# Patient Record
Sex: Male | Born: 1950
Health system: Southern US, Community
[De-identification: ages and names within clinical notes are randomized; demographics above are authoritative.]

## PROBLEM LIST (undated history)

## (undated) DIAGNOSIS — G6181 Chronic inflammatory demyelinating polyneuritis: Secondary | ICD-10-CM

## (undated) DIAGNOSIS — E785 Hyperlipidemia, unspecified: Secondary | ICD-10-CM

## (undated) DIAGNOSIS — G4730 Sleep apnea, unspecified: Secondary | ICD-10-CM

## (undated) DIAGNOSIS — E119 Type 2 diabetes mellitus without complications: Secondary | ICD-10-CM

## (undated) DIAGNOSIS — I10 Essential (primary) hypertension: Secondary | ICD-10-CM

## (undated) HISTORY — PX: KNEE SURGERY: SHX244

## (undated) HISTORY — PX: TONSILLECTOMY: SUR1361

## (undated) HISTORY — PX: APPENDECTOMY: SHX54

---

## 2005-05-30 ENCOUNTER — Ambulatory Visit: Payer: Self-pay

## 2007-09-10 ENCOUNTER — Ambulatory Visit: Payer: Self-pay | Admitting: Gastroenterology

## 2008-12-04 ENCOUNTER — Ambulatory Visit: Payer: Self-pay | Admitting: Family Medicine

## 2008-12-06 ENCOUNTER — Ambulatory Visit: Payer: Self-pay | Admitting: Family Medicine

## 2009-01-06 ENCOUNTER — Ambulatory Visit: Payer: Self-pay | Admitting: Family Medicine

## 2009-05-08 HISTORY — PX: COLONOSCOPY: SHX174

## 2010-07-03 ENCOUNTER — Emergency Department: Payer: Self-pay | Admitting: Emergency Medicine

## 2010-07-13 ENCOUNTER — Ambulatory Visit: Payer: Self-pay

## 2010-08-18 ENCOUNTER — Inpatient Hospital Stay: Payer: Self-pay | Admitting: Internal Medicine

## 2010-12-12 ENCOUNTER — Other Ambulatory Visit: Payer: Self-pay | Admitting: Neurology

## 2013-09-10 DIAGNOSIS — I10 Essential (primary) hypertension: Secondary | ICD-10-CM | POA: Insufficient documentation

## 2013-09-10 DIAGNOSIS — G6181 Chronic inflammatory demyelinating polyneuritis: Secondary | ICD-10-CM | POA: Insufficient documentation

## 2013-09-10 DIAGNOSIS — G4733 Obstructive sleep apnea (adult) (pediatric): Secondary | ICD-10-CM | POA: Insufficient documentation

## 2013-09-10 DIAGNOSIS — E119 Type 2 diabetes mellitus without complications: Secondary | ICD-10-CM | POA: Insufficient documentation

## 2013-09-10 DIAGNOSIS — E785 Hyperlipidemia, unspecified: Secondary | ICD-10-CM | POA: Insufficient documentation

## 2013-09-10 DIAGNOSIS — J302 Other seasonal allergic rhinitis: Secondary | ICD-10-CM | POA: Insufficient documentation

## 2014-04-07 ENCOUNTER — Ambulatory Visit: Payer: Self-pay | Admitting: Neurology

## 2014-09-10 ENCOUNTER — Encounter: Payer: Self-pay | Admitting: Emergency Medicine

## 2014-09-10 ENCOUNTER — Emergency Department
Admission: EM | Admit: 2014-09-10 | Discharge: 2014-09-10 | Disposition: A | Discharge status: Home / Self Care | Payer: Medicare Other | Attending: Emergency Medicine | Admitting: Emergency Medicine

## 2014-09-10 ENCOUNTER — Other Ambulatory Visit: Payer: Self-pay

## 2014-09-10 DIAGNOSIS — E119 Type 2 diabetes mellitus without complications: Secondary | ICD-10-CM | POA: Insufficient documentation

## 2014-09-10 DIAGNOSIS — Z87891 Personal history of nicotine dependence: Secondary | ICD-10-CM | POA: Diagnosis not present

## 2014-09-10 DIAGNOSIS — I158 Other secondary hypertension: Secondary | ICD-10-CM

## 2014-09-10 DIAGNOSIS — Z880 Allergy status to penicillin: Secondary | ICD-10-CM | POA: Diagnosis not present

## 2014-09-10 DIAGNOSIS — E871 Hypo-osmolality and hyponatremia: Secondary | ICD-10-CM | POA: Diagnosis not present

## 2014-09-10 DIAGNOSIS — I10 Essential (primary) hypertension: Secondary | ICD-10-CM | POA: Diagnosis present

## 2014-09-10 HISTORY — DX: Chronic inflammatory demyelinating polyneuritis: G61.81

## 2014-09-10 HISTORY — DX: Sleep apnea, unspecified: G47.30

## 2014-09-10 HISTORY — DX: Type 2 diabetes mellitus without complications: E11.9

## 2014-09-10 HISTORY — DX: Essential (primary) hypertension: I10

## 2014-09-10 LAB — COMPREHENSIVE METABOLIC PANEL
ALBUMIN: 3.8 g/dL (ref 3.5–5.0)
ALT: 28 U/L (ref 17–63)
AST: 36 U/L (ref 15–41)
Alkaline Phosphatase: 54 U/L (ref 38–126)
Anion gap: 11 (ref 5–15)
BILIRUBIN TOTAL: 0.7 mg/dL (ref 0.3–1.2)
BUN: 22 mg/dL — ABNORMAL HIGH (ref 6–20)
CALCIUM: 9.1 mg/dL (ref 8.9–10.3)
CHLORIDE: 91 mmol/L — AB (ref 101–111)
CO2: 25 mmol/L (ref 22–32)
CREATININE: 1.39 mg/dL — AB (ref 0.61–1.24)
GFR calc Af Amer: >60 mL/min (ref >60)
GFR calc non Af Amer: 52 mL/min — ABNORMAL LOW (ref >60)
GLUCOSE: 338 mg/dL — AB (ref 65–99)
Potassium: 4 mmol/L (ref 3.5–5.1)
Sodium: 127 mmol/L — ABNORMAL LOW (ref 135–145)
Total Protein: 10 g/dL — ABNORMAL HIGH (ref 6.5–8.1)

## 2014-09-10 LAB — CBC
HCT: 43.6 % (ref 40.0–52.0)
HEMOGLOBIN: 15 g/dL (ref 13.0–18.0)
MCH: 33.6 pg (ref 26.0–34.0)
MCHC: 34.3 g/dL (ref 32.0–36.0)
MCV: 97.8 fL (ref 80.0–100.0)
Platelets: 124 10*3/uL — ABNORMAL LOW (ref 150–440)
RBC: 4.45 MIL/uL (ref 4.40–5.90)
RDW: 12.9 % (ref 11.5–14.5)
WBC: 4.1 10*3/uL (ref 3.8–10.6)

## 2014-09-10 MED ORDER — HYDRALAZINE HCL 20 MG/ML IJ SOLN
INTRAMUSCULAR | Status: AC
  Administered 2014-09-10: 5 mg via INTRAVENOUS
  Filled 2014-09-10: qty 1

## 2014-09-10 MED ORDER — HYDRALAZINE HCL 20 MG/ML IJ SOLN
5.0000 mg | Freq: Once | INTRAMUSCULAR | Status: AC
  Administered 2014-09-10: 5 mg via INTRAVENOUS

## 2014-09-10 NOTE — ED Provider Notes (Signed)
[facility] Emergency Department Provider Note    ____________________________________________  Time seen: 1645  I have reviewed the triage vital signs and the nursing notes.   HISTORY  Chief Complaint Hypertension    HPI Phillip Fitzgerald is a 64 y.o. male with a history of hypertension and with history of chronic inflammatory demyelinating polyneuropathy. Phillip Fitzgerald receives IVIG infusions every 3 months. He was receiving one through her transfusion nurse at home today. His blood pressure continued to increase and it was thought best to come to the emergency department for evaluation and any treatment. He is asymptomatic. He is not having any chest pain or shortness of breath. He has not had these symptoms with previous infusions. He does take 2 or 3 different medications for blood pressure. He took them this morning. Despite this on arrival his blood pressure was 829 systolic.    Past Medical History  Diagnosis Date  . CIDP (chronic inflammatory demyelinating polyneuropathy)   . Diabetes mellitus without complication   . Sleep apnea   . Hypertension     There are no active problems to display for this patient.   Past Surgical History  Procedure Laterality Date  . Appendectomy      No current outpatient prescriptions on file.  Allergies Penicillins  No family history on file.  Social History History  Substance Use Topics  . Smoking status: Former Smoker    Quit date: 05/08/1978  . Smokeless tobacco: Not on file  . Alcohol Use: 1.8 oz/week    3 Cans of beer per week    Review of Systems  Constitutional: Negative for fever. Eyes: Negative for visual changes. ENT: Negative for sore throat. Cardiovascular: Negative for chest pain. Notable for hypertension. See history of present illness Respiratory: Negative for shortness of breath. Gastrointestinal: Negative for abdominal pain, vomiting and diarrhea. Genitourinary: Negative for  dysuria. Musculoskeletal: Negative for back pain. The patient does have some difficulty with his left leg due to his neuropathy. Skin: Negative for rash. Neurological: Negative for headaches, focal weakness or numbness.   10-point ROS otherwise negative.  ____________________________________________   PHYSICAL EXAM:  VITAL SIGNS: ED Triage Vitals  Enc Vitals Group     BP 09/10/14 1618 239/112 mmHg     Pulse Rate 09/10/14 1618 64     Resp 09/10/14 1618 18     Temp 09/10/14 1618 98.3 F (36.8 C)     Temp Source 09/10/14 1618 Oral     SpO2 09/10/14 1618 98 %     Weight 09/10/14 1618 197 lb (89.359 kg)     Height 09/10/14 1618 6\' 2"  (1.88 m)     Head Cir --      Peak Flow --      Pain Score --      Pain Loc --      Pain Edu? --      Excl. in [place]? --      Constitutional: Alert and oriented. Well appearing and in no distress. Eyes: Conjunctivae are normal. PERRL. Normal extraocular movements. ENT   Head: Normocephalic and atraumatic.   Nose: No congestion/rhinnorhea.   Mouth/Throat: Mucous membranes are moist.   Neck: No stridor. Hematological/Lymphatic/Immunilogical: No cervical lymphadenopathy. Cardiovascular: Normal rate at 62, regular rhythm. Normal and symmetric distal pulses are present in all extremities. No murmurs, rubs, or gallops. Respiratory: Normal respiratory effort without tachypnea nor retractions. Breath sounds are clear and equal bilaterally. No wheezes/rales/rhonchi. Gastrointestinal: Soft and nontender. No distention. No abdominal bruits.  There is no CVA tenderness. Musculoskeletal: Nontender with normal range of motion in all extremities. No joint effusions.  No lower extremity tenderness nor edema. Neurologic:  Normal speech and language. No gross focal neurologic deficits are appreciated. Speech is normal. No gait instability. Skin:  Skin is warm, dry and intact. No rash noted. Psychiatric: Mood and affect are normal. Speech and behavior are  normal. Patient exhibits appropriate insight and judgment.  ____________________________________________    LABS (pertinent positives/negatives)  Blood tests noted white blood cell and red blood cell level are normal. The patient's sodium level is a little low at 127 with an elevated glucose of 338. These were reviewed with the patient and his wife.  ____________________________________________   EKG  EKG at 1721 shows sinus bradycardia at a rate of 58 with normal intervals and a normal access.  ____________________________________________    [id number]    ____________________________________________   PROCEDURES  Procedure(s) performed: None  Critical Care performed: No  ____________________________________________   INITIAL IMPRESSION / ASSESSMENT AND PLAN / ED COURSE  Asymptomatic hypertension, likely triggered by his therapeutic infusion today. We will treat his high blood pressure with hydralazine 5 mg IV and reassess.  ----------------------------------------- 7:18 PM on 09/10/2014 -----------------------------------------  The patient's blood pressure as he is down to 182/94. Pulse of 68. He appears well and comfortable. We will discharge him home to continue his usual antihypertensives and to follow with his regular doctor, Otilio Miu, in 1-2 days. ____________________________________________   FINAL CLINICAL IMPRESSION(S) / ED DIAGNOSES  Final diagnoses:  Other secondary hypertension  Hyponatremia   hypertensive episode   Ahmed Prima, MD 09/10/14 [phone]

## 2014-09-10 NOTE — Discharge Instructions (Signed)
Continue your usual antihypertensive medicines tomorrow morning. Call Dr. Manuella Ghazi to discuss further infusion treatment. Follow-up with Dr. Ronnald Ramp for ongoing care of your hypertension and diabetes.  Hyponatremia  Hyponatremia is when the amount of salt (sodium) in your blood is too low. When sodium levels are low, your cells will absorb extra water and swell. The swelling happens throughout the body, but it mostly affects the brain. Severe brain swelling (cerebral edema), seizures, or coma can happen.  CAUSES   Heart, kidney, or liver problems.  Thyroid problems.  Adrenal gland problems.  Severe vomiting and diarrhea.  Certain medicines or illegal drugs.  Dehydration.  Drinking too much water.  Low-sodium diet. SYMPTOMS   Nausea and vomiting.  Confusion.  Lethargy.  Agitation.  Headache.  Twitching or shaking (seizures).  Unconsciousness.  Appetite loss.  Muscle weakness and cramping. DIAGNOSIS  Hyponatremia is identified by a simple blood test. Your caregiver will perform a history and physical exam to try to find the cause and type of hyponatremia. Other tests may be needed to measure the amount of sodium in your blood and urine. TREATMENT  Treatment will depend on the cause.   Fluids may be given through the vein (IV).  Medicines may be used to correct the sodium imbalance. If medicines are causing the problem, they will need to be adjusted.  Water or fluid intake may be restricted to restore proper balance. The speed of correcting the sodium problem is very important. If the problem is corrected too fast, nerve damage (sometimes unchangeable) can happen. HOME CARE INSTRUCTIONS   Only take medicines as directed by your caregiver. Many medicines can make hyponatremia worse. Discuss all your medicines with your caregiver.  Carefully follow any recommended diet, including any fluid restrictions.  You may be asked to repeat lab tests. Follow these  directions.  Avoid alcohol and recreational drugs. SEEK MEDICAL CARE IF:   You develop worsening nausea, fatigue, headache, confusion, or weakness.  Your original hyponatremia symptoms return.  You have problems following the recommended diet. SEEK IMMEDIATE MEDICAL CARE IF:   You have a seizure.  You faint.  You have ongoing diarrhea or vomiting. MAKE SURE YOU:   Understand these instructions.  Will watch your condition.  Will get help right away if you are not doing well or get worse. Document Released: 04/14/2002 Document Revised: 07/17/2011 Document Reviewed: 10/09/2010 [facility] Patient Information 2015 [place], [place]. This information is not intended to replace advice given to you by your health care provider. Make sure you discuss any questions you have with your health care provider.  Hypertension Hypertension, commonly called high blood pressure, is when the force of blood pumping through your arteries is too strong. Your arteries are the blood vessels that carry blood from your heart throughout your body. A blood pressure reading consists of a higher number over a lower number, such as 110/72. The higher number (systolic) is the pressure inside your arteries when your heart pumps. The lower number (diastolic) is the pressure inside your arteries when your heart relaxes. Ideally you want your blood pressure below 120/80. Hypertension forces your heart to work harder to pump blood. Your arteries may become narrow or stiff. Having hypertension puts you at risk for heart disease, stroke, and other problems.  RISK FACTORS Some risk factors for high blood pressure are controllable. Others are not.  Risk factors you cannot control include:   Race. You may be at higher risk if you are African American.  Age. Risk increases  with age.  Gender. Men are at higher risk than women before age 5 years. After age 39, women are at higher risk than men. Risk factors you can control  include:  Not getting enough exercise or physical activity.  Being overweight.  Getting too much fat, sugar, calories, or salt in your diet.  Drinking too much alcohol. SIGNS AND SYMPTOMS Hypertension does not usually cause signs or symptoms. Extremely high blood pressure (hypertensive crisis) may cause headache, anxiety, shortness of breath, and nosebleed. DIAGNOSIS  To check if you have hypertension, your health care provider will measure your blood pressure while you are seated, with your arm held at the level of your heart. It should be measured at least twice using the same arm. Certain conditions can cause a difference in blood pressure between your right and left arms. A blood pressure reading that is higher than normal on one occasion does not mean that you need treatment. If one blood pressure reading is high, ask your health care provider about having it checked again. TREATMENT  Treating high blood pressure includes making lifestyle changes and possibly taking medicine. Living a healthy lifestyle can help lower high blood pressure. You may need to change some of your habits. Lifestyle changes may include:  Following the DASH diet. This diet is high in fruits, vegetables, and whole grains. It is low in salt, red meat, and added sugars.  Getting at least 2 hours of brisk physical activity every week.  Losing weight if necessary.  Not smoking.  Limiting alcoholic beverages.  Learning ways to reduce stress. If lifestyle changes are not enough to get your blood pressure under control, your health care provider may prescribe medicine. You may need to take more than one. Work closely with your health care provider to understand the risks and benefits. HOME CARE INSTRUCTIONS  Have your blood pressure rechecked as directed by your health care provider.   Take medicines only as directed by your health care provider. Follow the directions carefully. Blood pressure medicines must  be taken as prescribed. The medicine does not work as well when you skip doses. Skipping doses also puts you at risk for problems.   Do not smoke.   Monitor your blood pressure at home as directed by your health care provider. SEEK MEDICAL CARE IF:   You think you are having a reaction to medicines taken.  You have recurrent headaches or feel dizzy.  You have swelling in your ankles.  You have trouble with your vision. SEEK IMMEDIATE MEDICAL CARE IF:  You develop a severe headache or confusion.  You have unusual weakness, numbness, or feel faint.  You have severe chest or abdominal pain.  You vomit repeatedly.  You have trouble breathing. MAKE SURE YOU:   Understand these instructions.  Will watch your condition.  Will get help right away if you are not doing well or get worse. Document Released: 04/24/2005 Document Revised: 09/08/2013 Document Reviewed: 02/14/2013 [facility] Patient Information 2015 [place], [place]. This information is not intended to replace advice given to you by your health care provider. Make sure you discuss any questions you have with your health care provider.

## 2014-09-10 NOTE — ED Notes (Signed)
Pt to ER via EMS from home with c/o HTN today.  Pt states takes quarterly infusion of IVIG for CIDP.  States has been taking medication for several years with no difficulty.  Infusions today BP has been increasing throughout infusion today.  Pt denies s/s at this time.

## 2014-11-02 ENCOUNTER — Encounter (INDEPENDENT_AMBULATORY_CARE_PROVIDER_SITE_OTHER): Payer: Self-pay

## 2014-11-02 ENCOUNTER — Ambulatory Visit (INDEPENDENT_AMBULATORY_CARE_PROVIDER_SITE_OTHER): Payer: Medicare Other | Admitting: Family Medicine

## 2014-11-02 ENCOUNTER — Encounter: Payer: Self-pay | Admitting: Family Medicine

## 2014-11-02 VITALS — BP 140/100 | HR 68 | Ht 74.0 in | Wt 205.0 lb

## 2014-11-02 DIAGNOSIS — E785 Hyperlipidemia, unspecified: Secondary | ICD-10-CM | POA: Diagnosis not present

## 2014-11-02 DIAGNOSIS — E669 Obesity, unspecified: Secondary | ICD-10-CM | POA: Insufficient documentation

## 2014-11-02 DIAGNOSIS — I10 Essential (primary) hypertension: Secondary | ICD-10-CM

## 2014-11-02 DIAGNOSIS — E119 Type 2 diabetes mellitus without complications: Secondary | ICD-10-CM

## 2014-11-02 MED ORDER — AMLODIPINE BESYLATE 10 MG PO TABS: 10.0000 mg | ORAL_TABLET | Freq: Every day | ORAL | Status: DC

## 2014-11-02 MED ORDER — INSULIN GLARGINE 100 UNIT/ML SOLOSTAR PEN: 60.0000 [IU] | PEN_INJECTOR | Freq: Every day | SUBCUTANEOUS | Status: DC

## 2014-11-02 MED ORDER — TELMISARTAN-HCTZ 80-25 MG PO TABS: 1.0000 | ORAL_TABLET | Freq: Every day | ORAL | Status: DC

## 2014-11-02 MED ORDER — METFORMIN HCL 500 MG PO TABS: 500.0000 mg | ORAL_TABLET | Freq: Two times a day (BID) | ORAL | Status: DC

## 2014-11-02 MED ORDER — CARVEDILOL 12.5 MG PO TABS: 12.5000 mg | ORAL_TABLET | Freq: Two times a day (BID) | ORAL | Status: DC

## 2014-11-02 NOTE — Progress Notes (Signed)
Name: Phillip Fitzgerald   MRN: [id number]    DOB: 09-13-1950   Date:11/02/2014       Progress Note  Subjective  Chief Complaint  Chief Complaint  Patient presents with  . Diabetes  . Hypertension  . Hyperlipidemia  . Depression    Diabetes He presents for his follow-up diabetic visit. He has type 2 diabetes mellitus. His disease course has been stable. There are no hypoglycemic associated symptoms. Pertinent negatives for hypoglycemia include no dizziness, headaches or sweats. Pertinent negatives for diabetes include no blurred vision, no chest pain, no fatigue, no foot paresthesias, no foot ulcerations, no polydipsia, no polyphagia, no polyuria, no visual change, no weakness and no weight loss. There are no hypoglycemic complications. There are no diabetic complications. Pertinent negatives for diabetic complications include no CVA, PVD or retinopathy. There are no known risk factors for coronary artery disease. Current diabetic treatment includes insulin injections. He is compliant with treatment all of the time. He is currently taking insulin at bedtime. Insulin injections are given by patient. Rotation sites for injection include the abdominal wall. His weight is stable. He is following a generally healthy diet. His breakfast blood glucose is taken between 7-8 am. His breakfast blood glucose range is generally 130-140 mg/dl. An ACE inhibitor/angiotensin II receptor blocker is being taken. He does not see a podiatrist.Eye exam is current.  Hypertension This is a recurrent problem. The current episode started more than 1 year ago. The problem has been gradually worsening since onset. The problem is controlled. Pertinent negatives include no anxiety, blurred vision, chest pain, headaches, malaise/fatigue, neck pain, orthopnea, palpitations, peripheral edema, PND, shortness of breath or sweats. There are no associated agents to hypertension. Risk factors for coronary artery disease include diabetes  mellitus, dyslipidemia, male gender and obesity. Past treatments include beta blockers, calcium channel blockers, angiotensin blockers and diuretics. The current treatment provides no improvement. There are no compliance problems.  There is no history of angina, kidney disease, CAD/MI, CVA, heart failure, left ventricular hypertrophy, PVD or retinopathy. There is no history of chronic renal disease.  Hyperlipidemia This is a recurrent problem. The current episode started more than 1 year ago. The problem is controlled. Recent lipid tests were reviewed and are normal. He has no history of chronic renal disease. Pertinent negatives include no chest pain, myalgias or shortness of breath. The current treatment provides mild improvement of lipids. There are no compliance problems.  Risk factors for coronary artery disease include diabetes mellitus, dyslipidemia, hypertension, male sex, stress and a sedentary lifestyle.    No problem-specific assessment & plan notes found for this encounter.   Past Medical History  Diagnosis Date  . CIDP (chronic inflammatory demyelinating polyneuropathy)   . Diabetes mellitus without complication   . Sleep apnea   . Hypertension     Past Surgical History  Procedure Laterality Date  . Appendectomy    . Tonsillectomy    . Knee surgery      Family History  Problem Relation Age of Onset  . Diabetes Father   . Heart disease Father     History   Social History  . Marital Status: Married    Spouse Name: N/A  . Number of Children: N/A  . Years of Education: N/A   Occupational History  . Not on file.   Social History Main Topics  . Smoking status: Former Smoker    Quit date: 05/08/1978  . Smokeless tobacco: Not on file  . Alcohol Use: 1.8  oz/week    3 Cans of beer per week  . Drug Use: No  . Sexual Activity: Yes   Other Topics Concern  . Not on file   Social History Narrative    Allergies  Allergen Reactions  . Penicillins Other (See  Comments)    Unknown reaction from chilhood     Review of Systems  Constitutional: Negative for fever, chills, weight loss, malaise/fatigue, diaphoresis and fatigue.  HENT: Negative for ear discharge, ear pain, sore throat and tinnitus.   Eyes: Negative for blurred vision.  Respiratory: Negative.  Negative for cough, hemoptysis, sputum production, shortness of breath and wheezing.   Cardiovascular: Negative for chest pain, palpitations, orthopnea, claudication, leg swelling and PND.  Gastrointestinal: Negative for heartburn, nausea, vomiting, abdominal pain, diarrhea and constipation.  Genitourinary: Negative.  Negative for dysuria, urgency, frequency and hematuria.  Musculoskeletal: Negative for myalgias, back pain and neck pain.  Skin: Negative for itching and rash.  Neurological: Negative for dizziness, loss of consciousness, weakness and headaches.  Endo/Heme/Allergies: Negative for polydipsia and polyphagia.  Psychiatric/Behavioral: Positive for depression.     Objective  Filed Vitals:   11/02/14 1357  BP: 140/100  Pulse: 68  Height: 6\' 2"  (1.88 m)  Weight: 205 lb (92.987 kg)    Physical Exam  Constitutional: He is oriented to person, place, and time and well-developed, well-nourished, and in no distress.  HENT:  Head: Normocephalic.  Right Ear: External ear normal.  Left Ear: External ear normal.  Nose: Nose normal.  Mouth/Throat: Oropharynx is clear and moist.  Eyes: Conjunctivae and EOM are normal. Pupils are equal, round, and reactive to light. Right eye exhibits no discharge. Left eye exhibits no discharge. No scleral icterus.  Neck: Normal range of motion. Neck supple. No JVD present. No tracheal deviation present. No thyromegaly present.  Cardiovascular: Normal rate, regular rhythm, normal heart sounds and intact distal pulses.  Exam reveals no gallop and no friction rub.   No murmur heard. Pulmonary/Chest: Breath sounds normal. No respiratory distress. He has  no wheezes. He has no rales.  Abdominal: Soft. Bowel sounds are normal. He exhibits no mass. There is no hepatosplenomegaly. There is no tenderness. There is no rebound, no guarding and no CVA tenderness.  Musculoskeletal: Normal range of motion. He exhibits no edema or tenderness.  Lymphadenopathy:    He has no cervical adenopathy.  Neurological: He is alert and oriented to person, place, and time. He has normal sensation, normal strength, normal reflexes and intact cranial nerves. No cranial nerve deficit.  Skin: Skin is warm. No rash noted.  Psychiatric: Mood and affect normal.  Nursing note and vitals reviewed.     Assessment & Plan  Problem List Items Addressed This Visit      Cardiovascular and Mediastinum   BP (high blood pressure) - Primary   Relevant Medications   nebivolol (BYSTOLIC) 10 MG tablet   rosuvastatin (CRESTOR) 40 MG tablet   carvedilol (COREG) 12.5 MG tablet   amLODipine (NORVASC) 10 MG tablet   telmisartan-hydrochlorothiazide (MICARDIS HCT) 80-25 MG per tablet   Other Relevant Orders   Renal Function Panel     Endocrine   Diabetes   Relevant Medications   rosuvastatin (CRESTOR) 40 MG tablet   Insulin Glargine (LANTUS SOLOSTAR) 100 UNIT/ML Solostar Pen   metFORMIN (GLUCOPHAGE) 500 MG tablet   telmisartan-hydrochlorothiazide (MICARDIS HCT) 80-25 MG per tablet   Other Relevant Orders   HgB A1c   Renal Function Panel    Other Visit Diagnoses  Hyperlipemia        Relevant Medications    nebivolol (BYSTOLIC) 10 MG tablet    rosuvastatin (CRESTOR) 40 MG tablet    carvedilol (COREG) 12.5 MG tablet    amLODipine (NORVASC) 10 MG tablet    telmisartan-hydrochlorothiazide (MICARDIS HCT) 80-25 MG per tablet         Dr. Macon [facility]  11/02/2014

## 2014-11-02 NOTE — Patient Instructions (Signed)

## 2014-12-15 ENCOUNTER — Ambulatory Visit: Payer: Medicare Other | Admitting: Family Medicine

## 2014-12-26 LAB — RENAL FUNCTION PANEL
Albumin: 4.2 g/dL (ref 3.6–4.8)
BUN/Creatinine Ratio: 16 (ref 10–22)
BUN: 20 mg/dL (ref 8–27)
CALCIUM: 9.7 mg/dL (ref 8.6–10.2)
CO2: 23 mmol/L (ref 18–29)
CREATININE: 1.22 mg/dL (ref 0.76–1.27)
Chloride: 90 mmol/L — ABNORMAL LOW (ref 97–108)
GFR, EST AFRICAN AMERICAN: 72 mL/min/{1.73_m2} (ref >59)
GFR, EST NON AFRICAN AMERICAN: 62 mL/min/{1.73_m2} (ref >59)
Glucose: 165 mg/dL — ABNORMAL HIGH (ref 65–99)
Phosphorus: 3.5 mg/dL (ref 2.5–4.5)
Potassium: 4 mmol/L (ref 3.5–5.2)
SODIUM: 137 mmol/L (ref 134–144)

## 2014-12-26 LAB — HEMOGLOBIN A1C
Est. average glucose Bld gHb Est-mCnc: 223 mg/dL
HEMOGLOBIN A1C: 9.4 % — AB (ref 4.8–5.6)

## 2014-12-28 ENCOUNTER — Encounter: Payer: Self-pay | Admitting: Family Medicine

## 2014-12-28 ENCOUNTER — Ambulatory Visit (INDEPENDENT_AMBULATORY_CARE_PROVIDER_SITE_OTHER): Payer: Medicare Other | Admitting: Family Medicine

## 2014-12-28 VITALS — BP 140/80 | HR 84 | Ht 74.0 in | Wt 205.0 lb

## 2014-12-28 DIAGNOSIS — I10 Essential (primary) hypertension: Secondary | ICD-10-CM

## 2014-12-28 DIAGNOSIS — E785 Hyperlipidemia, unspecified: Secondary | ICD-10-CM | POA: Insufficient documentation

## 2014-12-28 DIAGNOSIS — E119 Type 2 diabetes mellitus without complications: Secondary | ICD-10-CM

## 2014-12-28 MED ORDER — INSULIN GLARGINE 100 UNIT/ML SOLOSTAR PEN
66.0000 [IU] | PEN_INJECTOR | Freq: Every day | SUBCUTANEOUS | Status: DC
Start: 2014-12-28 — End: 2015-11-17

## 2014-12-28 MED ORDER — METFORMIN HCL 500 MG PO TABS: 500.0000 mg | ORAL_TABLET | Freq: Two times a day (BID) | ORAL | Status: DC

## 2014-12-28 NOTE — Progress Notes (Signed)
Name: Phillip Fitzgerald   MRN: [id number]    DOB: 03/31/1951   Date:12/28/2014       Progress Note  Subjective  Chief Complaint  Chief Complaint  Patient presents with  . Hyperlipidemia    took off Crestor in June  . Diabetes    A1C= 9.4    Diabetes He presents for his follow-up diabetic visit. He has type 2 diabetes mellitus. His disease course has been worsening. Pertinent negatives for hypoglycemia include no confusion, dizziness, headaches, hunger, mood changes, nervousness/anxiousness, pallor, seizures, sleepiness, speech difficulty, sweats or tremors. Pertinent negatives for diabetes include no blurred vision, no chest pain, no fatigue, no foot paresthesias, no foot ulcerations, no polydipsia, no polyphagia, no polyuria, no visual change, no weakness and no weight loss. There are no hypoglycemic complications. Symptoms are worsening. Pertinent negatives for diabetic complications include no autonomic neuropathy, CVA, heart disease, impotence, nephropathy, peripheral neuropathy, PVD or retinopathy. Risk factors for coronary artery disease include dyslipidemia. His weight is stable. He is following a generally healthy diet. There is no change in his home blood glucose trend. His breakfast blood glucose is taken between 7-8 am. His breakfast blood glucose range is generally 140-180 mg/dl.  Hyperlipidemia This is a recurrent problem. The current episode started more than 1 year ago. The problem is uncontrolled. He has no history of chronic renal disease, diabetes, hypothyroidism, liver disease, obesity or nephrotic syndrome. Associated symptoms include a focal weakness. Pertinent negatives include no chest pain, focal sensory loss, leg pain, myalgias or shortness of breath. Current antihyperlipidemic treatment includes diet change. There are no compliance problems.  Risk factors for coronary artery disease include hypertension, male sex, dyslipidemia, diabetes mellitus and a sedentary lifestyle.     No problem-specific assessment & plan notes found for this encounter.   Past Medical History  Diagnosis Date  . CIDP (chronic inflammatory demyelinating polyneuropathy)   . Diabetes mellitus without complication   . Sleep apnea   . Hypertension     Past Surgical History  Procedure Laterality Date  . Appendectomy    . Tonsillectomy    . Knee surgery    . Colonoscopy  2011    cleared for 5 yrs- Dr Allen Norris    Family History  Problem Relation Age of Onset  . Diabetes Father   . Heart disease Father     Social History   Social History  . Marital Status: Married    Spouse Name: N/A  . Number of Children: N/A  . Years of Education: N/A   Occupational History  . Not on file.   Social History Main Topics  . Smoking status: Former Smoker    Quit date: 05/08/1978  . Smokeless tobacco: Not on file  . Alcohol Use: 1.8 oz/week    3 Cans of beer per week  . Drug Use: No  . Sexual Activity: Yes   Other Topics Concern  . Not on file   Social History Narrative    Allergies  Allergen Reactions  . Penicillins Other (See Comments)    Unknown reaction from chilhood     Review of Systems  Constitutional: Negative for fever, chills, weight loss, malaise/fatigue and fatigue.  HENT: Negative for ear discharge, ear pain and sore throat.   Eyes: Negative for blurred vision.  Respiratory: Negative for cough, sputum production, shortness of breath and wheezing.   Cardiovascular: Negative for chest pain, palpitations and leg swelling.  Gastrointestinal: Negative for heartburn, nausea, abdominal pain, diarrhea, constipation, blood in stool  and melena.  Genitourinary: Negative for dysuria, urgency, frequency, hematuria and impotence.  Musculoskeletal: Negative for myalgias, back pain, joint pain and neck pain.  Skin: Negative for pallor and rash.  Neurological: Positive for focal weakness. Negative for dizziness, tingling, tremors, sensory change, seizures, speech difficulty,  weakness and headaches.  Endo/Heme/Allergies: Negative for environmental allergies, polydipsia and polyphagia. Does not bruise/bleed easily.  Psychiatric/Behavioral: Negative for depression, suicidal ideas and confusion. The patient is not nervous/anxious and does not have insomnia.      Objective  Filed Vitals:   12/28/14 1338  BP: 140/80  Pulse: 84  Height: 6\' 2"  (1.88 m)  Weight: 205 lb (92.987 kg)    Physical Exam  Constitutional: He is oriented to person, place, and time and well-developed, well-nourished, and in no distress.  HENT:  Head: Normocephalic.  Right Ear: External ear normal.  Left Ear: External ear normal.  Nose: Nose normal.  Mouth/Throat: Oropharynx is clear and moist.  Eyes: Conjunctivae and EOM are normal. Pupils are equal, round, and reactive to light. Right eye exhibits no discharge. Left eye exhibits no discharge. No scleral icterus.  Neck: Normal range of motion. Neck supple. No JVD present. No tracheal deviation present. No thyromegaly present.  Cardiovascular: Normal rate, regular rhythm, normal heart sounds and intact distal pulses.  Exam reveals no gallop and no friction rub.   No murmur heard. Pulmonary/Chest: Breath sounds normal. No respiratory distress. He has no wheezes. He has no rales.  Abdominal: Soft. Bowel sounds are normal. He exhibits no mass. There is no hepatosplenomegaly. There is no tenderness. There is no rebound, no guarding and no CVA tenderness.  Musculoskeletal: Normal range of motion. He exhibits no edema or tenderness.  Lymphadenopathy:    He has no cervical adenopathy.  Neurological: He is alert and oriented to person, place, and time. He has normal sensation, normal strength, normal reflexes and intact cranial nerves. No cranial nerve deficit.  Skin: Skin is warm. No rash noted.  Psychiatric: Mood and affect normal.      Assessment & Plan  Problem List Items Addressed This Visit      Cardiovascular and Mediastinum   BP  (high blood pressure) - Primary     Endocrine   Diabetes   Relevant Medications   Insulin Glargine (LANTUS SOLOSTAR) 100 UNIT/ML Solostar Pen   metFORMIN (GLUCOPHAGE) 500 MG tablet   Other Relevant Orders   Ambulatory referral to Endocrinology     Other   Hyperlipidemia        Dr. [facility]  12/28/2014

## 2014-12-29 LAB — LIPID PANEL WITH LDL/HDL RATIO
Cholesterol, Total: 250 mg/dL — ABNORMAL HIGH (ref 100–199)
HDL: 39 mg/dL — ABNORMAL LOW (ref >39)
Triglycerides: 419 mg/dL — ABNORMAL HIGH (ref 0–149)

## 2015-01-15 LAB — SPECIMEN STATUS REPORT

## 2015-03-19 ENCOUNTER — Other Ambulatory Visit: Payer: Self-pay | Admitting: Family Medicine

## 2015-06-18 ENCOUNTER — Other Ambulatory Visit: Payer: Self-pay | Admitting: Family Medicine

## 2015-07-16 ENCOUNTER — Other Ambulatory Visit: Payer: Self-pay | Admitting: Family Medicine

## 2015-08-22 ENCOUNTER — Other Ambulatory Visit: Payer: Self-pay | Admitting: Family Medicine

## 2015-09-06 ENCOUNTER — Other Ambulatory Visit: Payer: Medicare Other

## 2015-09-07 ENCOUNTER — Encounter
Admission: RE | Admit: 2015-09-07 | Discharge: 2015-09-07 | Disposition: A | Discharge status: Home / Self Care | Payer: Medicare Other | Source: Ambulatory Visit | Attending: Surgery | Admitting: Surgery

## 2015-09-07 DIAGNOSIS — Z01812 Encounter for preprocedural laboratory examination: Secondary | ICD-10-CM | POA: Diagnosis not present

## 2015-09-07 HISTORY — DX: Hyperlipidemia, unspecified: E78.5

## 2015-09-07 LAB — SURGICAL PCR SCREEN
MRSA, PCR: NEGATIVE
Staphylococcus aureus: POSITIVE — AB

## 2015-09-07 NOTE — Patient Instructions (Signed)
  Your procedure is scheduled on:  Report to Same Day Surgery 2nd floor medical mall To find out your arrival time please call [phone] between 1PM - 3PM on   Remember: Instructions that are not followed completely may result in serious medical risk, up to and including death, or upon the discretion of your surgeon and anesthesiologist your surgery may need to be rescheduled.    _x___ 1. Do not eat food or drink liquids after midnight. No gum chewing or hard candies.     __x__ 2. No Alcohol for 24 hours before or after surgery.   ____ 3. Bring all medications with you on the day of surgery if instructed.    __x__ 4. Notify your doctor if there is any change in your medical condition     (cold, fever, infections).     Do not wear jewelry, make-up, hairpins, clips or nail polish.  Do not wear lotions, powders, or perfumes. You may wear deodorant.  Do not shave 48 hours prior to surgery. Men may shave face and neck.  Do not bring valuables to the hospital.    [facility] is not responsible for any belongings or valuables.               Contacts, dentures or bridgework may not be worn into surgery.  Leave your suitcase in the car. After surgery it may be brought to your room.  For patients admitted to the hospital, discharge time is determined by your treatment team.   Patients discharged the day of surgery will not be allowed to drive home.    Please read over the following fact sheets that you were given:   [facility] Preparing for Surgery and or MRSA Information   _x___ Take these medicines the morning of surgery with A SIP OF WATER:    1. amLODipine (NORVASC) 10 MG tablet  2.carvedilol (COREG) 12.5 MG tablet  3.venlafaxine (EFFEXOR) 75 MG tablet  4.  5.  6.  ____ Fleet Enema (as directed)   x____ Use CHG Soap or sage wipes as directed on instruction sheet   ____ Use inhalers on the day of surgery and bring to hospital day of surgery  _x___ Stop metformin 2 days  prior to surgery    _x___ Take 1/2 of usual insulin dose the night before surgery and none on the morning of           surgery.   ____ Stop aspirin or coumadin, or plavix  _x__ Stop Anti-inflammatories such as Advil, Aleve, Ibuprofen, Motrin, Naproxen,          Naprosyn, Goodies powders or aspirin products. Ok to take Tylenol.   ____ Stop supplements until after surgery.    ____ Bring C-Pap to the hospital.

## 2015-09-13 NOTE — Pre-Procedure Instructions (Signed)
Neurology clearance on chart

## 2015-09-16 ENCOUNTER — Encounter: Payer: Self-pay | Admitting: *Deleted

## 2015-09-16 ENCOUNTER — Ambulatory Visit: Payer: Medicare Other | Admitting: Anesthesiology

## 2015-09-16 ENCOUNTER — Ambulatory Visit: Payer: Medicare Other

## 2015-09-16 ENCOUNTER — Encounter
Admission: RE | Disposition: A | Discharge status: Home / Self Care | Payer: Self-pay | Source: Ambulatory Visit | Attending: Surgery

## 2015-09-16 ENCOUNTER — Ambulatory Visit
Admission: RE | Admit: 2015-09-16 | Discharge: 2015-09-16 | Disposition: A | Discharge status: Home / Self Care | Payer: Medicare Other | Source: Ambulatory Visit | Attending: Surgery | Admitting: Surgery

## 2015-09-16 DIAGNOSIS — I10 Essential (primary) hypertension: Secondary | ICD-10-CM | POA: Insufficient documentation

## 2015-09-16 DIAGNOSIS — Z794 Long term (current) use of insulin: Secondary | ICD-10-CM | POA: Diagnosis not present

## 2015-09-16 DIAGNOSIS — E785 Hyperlipidemia, unspecified: Secondary | ICD-10-CM | POA: Insufficient documentation

## 2015-09-16 DIAGNOSIS — E119 Type 2 diabetes mellitus without complications: Secondary | ICD-10-CM | POA: Diagnosis not present

## 2015-09-16 DIAGNOSIS — G379 Demyelinating disease of central nervous system, unspecified: Secondary | ICD-10-CM

## 2015-09-16 DIAGNOSIS — Z87891 Personal history of nicotine dependence: Secondary | ICD-10-CM | POA: Diagnosis not present

## 2015-09-16 DIAGNOSIS — Z79899 Other long term (current) drug therapy: Secondary | ICD-10-CM | POA: Diagnosis not present

## 2015-09-16 DIAGNOSIS — G6181 Chronic inflammatory demyelinating polyneuritis: Secondary | ICD-10-CM | POA: Diagnosis present

## 2015-09-16 DIAGNOSIS — G4730 Sleep apnea, unspecified: Secondary | ICD-10-CM | POA: Diagnosis not present

## 2015-09-16 HISTORY — PX: PORTACATH PLACEMENT: SHX2246

## 2015-09-16 LAB — GLUCOSE, CAPILLARY
GLUCOSE-CAPILLARY: 149 mg/dL — AB (ref 65–99)
GLUCOSE-CAPILLARY: 194 mg/dL — AB (ref 65–99)

## 2015-09-16 SURGERY — INSERTION, TUNNELED CENTRAL VENOUS DEVICE, WITH PORT
Anesthesia: General | Site: Chest | Laterality: Right | Wound class: Clean

## 2015-09-16 MED ORDER — FENTANYL CITRATE (PF) 100 MCG/2ML IJ SOLN
INTRAMUSCULAR | Status: DC | PRN
  Administered 2015-09-16: 25 ug via INTRAVENOUS
  Administered 2015-09-16: 50 ug via INTRAVENOUS

## 2015-09-16 MED ORDER — ONDANSETRON HCL 4 MG/2ML IJ SOLN: 4.0000 mg | Freq: Once | INTRAMUSCULAR | Status: DC | PRN

## 2015-09-16 MED ORDER — MIDAZOLAM HCL 2 MG/2ML IJ SOLN
INTRAMUSCULAR | Status: DC | PRN
  Administered 2015-09-16: 2 mg via INTRAVENOUS

## 2015-09-16 MED ORDER — PROPOFOL 500 MG/50ML IV EMUL
INTRAVENOUS | Status: DC | PRN
  Administered 2015-09-16: 50 ug/kg/min via INTRAVENOUS

## 2015-09-16 MED ORDER — LIDOCAINE HCL (PF) 1 % IJ SOLN
INTRAMUSCULAR | Status: AC
  Filled 2015-09-16: qty 30

## 2015-09-16 MED ORDER — HEPARIN SODIUM (PORCINE) 5000 UNIT/ML IJ SOLN
INTRAMUSCULAR | Status: AC
  Filled 2015-09-16: qty 1

## 2015-09-16 MED ORDER — SODIUM CHLORIDE 0.9 % IV SOLN
INTRAVENOUS | Status: DC | PRN
  Administered 2015-09-16: 12 mL via INTRAMUSCULAR

## 2015-09-16 MED ORDER — VANCOMYCIN HCL IN DEXTROSE 1-5 GM/200ML-% IV SOLN
INTRAVENOUS | Status: AC
  Filled 2015-09-16: qty 200

## 2015-09-16 MED ORDER — FAMOTIDINE 20 MG PO TABS
20.0000 mg | ORAL_TABLET | Freq: Once | ORAL | Status: AC
  Administered 2015-09-16: 20 mg via ORAL

## 2015-09-16 MED ORDER — LIDOCAINE HCL (PF) 1 % IJ SOLN
INTRAMUSCULAR | Status: DC | PRN
  Administered 2015-09-16: 9 mL

## 2015-09-16 MED ORDER — FAMOTIDINE 20 MG PO TABS
ORAL_TABLET | ORAL | Status: AC
  Administered 2015-09-16: 20 mg via ORAL
  Filled 2015-09-16: qty 1

## 2015-09-16 MED ORDER — FENTANYL CITRATE (PF) 100 MCG/2ML IJ SOLN: 25.0000 ug | INTRAMUSCULAR | Status: DC | PRN

## 2015-09-16 MED ORDER — SODIUM CHLORIDE 0.9 % IJ SOLN
INTRAMUSCULAR | Status: AC
  Filled 2015-09-16: qty 50

## 2015-09-16 MED ORDER — PROPOFOL 10 MG/ML IV BOLUS
INTRAVENOUS | Status: DC | PRN
  Administered 2015-09-16: 10 mg via INTRAVENOUS
  Administered 2015-09-16: 20 mg via INTRAVENOUS
  Administered 2015-09-16: 10 mg via INTRAVENOUS
  Administered 2015-09-16: 30 mg via INTRAVENOUS
  Administered 2015-09-16: 10 mg via INTRAVENOUS

## 2015-09-16 MED ORDER — SODIUM CHLORIDE 0.9 % IV SOLN
INTRAVENOUS | Status: DC
  Administered 2015-09-16 (×2): via INTRAVENOUS

## 2015-09-16 MED ORDER — VANCOMYCIN HCL IN DEXTROSE 1-5 GM/200ML-% IV SOLN
1000.0000 mg | Freq: Once | INTRAVENOUS | Status: AC
Start: 2015-09-16 — End: 2015-09-16
  Administered 2015-09-16: 1000 mg via INTRAVENOUS

## 2015-09-16 SURGICAL SUPPLY — 23 items
CANISTER SUCT 1200ML W/VALVE (MISCELLANEOUS) ×3 IMPLANT
CHLORAPREP W/TINT 26ML (MISCELLANEOUS) ×3 IMPLANT
COVER LIGHT HANDLE STERIS (MISCELLANEOUS) ×6 IMPLANT
DRAPE C-ARM XRAY 36X54 (DRAPES) ×3 IMPLANT
ELECT REM PT RETURN 9FT ADLT (ELECTROSURGICAL) ×3
ELECTRODE REM PT RTRN 9FT ADLT (ELECTROSURGICAL) ×1 IMPLANT
GLOVE BIO SURGEON STRL SZ7.5 (GLOVE) ×18 IMPLANT
GOWN STRL REUS W/ TWL LRG LVL3 (GOWN DISPOSABLE) ×3 IMPLANT
GOWN STRL REUS W/TWL LRG LVL3 (GOWN DISPOSABLE) ×6
KIT PORT POWER 8FR ISP CVUE (Catheter) ×3 IMPLANT
KIT RM TURNOVER STRD PROC AR (KITS) ×3 IMPLANT
LABEL OR SOLS (LABEL) ×3 IMPLANT
LIQUID BAND (GAUZE/BANDAGES/DRESSINGS) ×3 IMPLANT
NEEDLE FILTER BLUNT 18X 1/2SAF (NEEDLE) ×2
NEEDLE FILTER BLUNT 18X1 1/2 (NEEDLE) ×1 IMPLANT
PACK PORT-A-CATH (MISCELLANEOUS) ×3 IMPLANT
SUT MNCRL+ 5-0 UNDYED PC-3 (SUTURE) IMPLANT
SUT MON AB 5-0 P3 18 (SUTURE) ×3 IMPLANT
SUT MONOCRYL 5-0 (SUTURE)
SUT SILK 4 0 SH (SUTURE) ×3 IMPLANT
SUT VIC AB 5-0 RB1 27 (SUTURE) ×3 IMPLANT
SYR 3ML LL SCALE MARK (SYRINGE) ×3 IMPLANT
SYRINGE 10CC LL (SYRINGE) ×3 IMPLANT

## 2015-09-16 NOTE — Anesthesia Postprocedure Evaluation (Signed)
Anesthesia Post Note  Patient: [name]  Procedure(s) Performed: Procedure(s) (LRB): INSERTION PORT-A-CATH (Right)  Patient location during evaluation: PACU Anesthesia Type: General Level of consciousness: awake and alert Pain management: pain level controlled Vital Signs Assessment: post-procedure vital signs reviewed and stable Respiratory status: spontaneous breathing, nonlabored ventilation, respiratory function stable and patient connected to nasal cannula oxygen Cardiovascular status: blood pressure returned to baseline and stable Postop Assessment: no signs of nausea or vomiting Anesthetic complications: no    Last Vitals:  Filed Vitals:   09/16/15 1130 09/16/15 1144  BP: 138/79 147/85  Pulse: 61 73  Temp: 36.9 C 36.6 C  Resp: 16 16    Last Pain: There were no vitals filed for this visit.               Martha Clan

## 2015-09-16 NOTE — Consult Note (Signed)
  He reports no change in condition since office exam.  Labs noted.  Discussed plan for port

## 2015-09-16 NOTE — Anesthesia Procedure Notes (Signed)
Date/Time: 09/16/2015 9:30 AM Performed by: Allean Found Pre-anesthesia Checklist: Patient identified, Emergency Drugs available, Suction available, Patient being monitored and Timeout performed Patient Re-evaluated:Patient Re-evaluated prior to inductionOxygen Delivery Method: Nasal cannula and Circle system utilized Preoxygenation: Pre-oxygenation with 100% oxygen Intubation Type: IV induction Placement Confirmation: positive ETCO2 Dental Injury: Teeth and Oropharynx as per pre-operative assessment

## 2015-09-16 NOTE — Op Note (Signed)
OPERATIVE REPORT  Preop diagnosis: demyelinating disease  Postoperative diagnosis: demyelinating disease  Procedure: Insertion of central venous catheter with subcutaneous infusion port  SURGEON: Rochel Brome M.D.  INDICATIONS: He has history of demyelinating disease needing gammaglobulin infusions each months. He has poor venous access. Insertion of central venous catheter with subcutaneous infusion port was recommended for venous access.  With the patient on the operating table in the supine position a rolled sheet was placed behind the shoulder blades to extend the neck. She was monitored by the anesthesia staff and sedated. The neck and chest wall were prepared with ChloraPrep and draped in a sterile manner.  The skin beneath the right clavicle was infiltrated with 1% Xylocaine. A transversely oriented 3 cm incision was made and carried down through subcutaneous tissues. Several small bleeding points were cauterized. A subcutaneous pouch was created large enough to admit the ClearView port. The jugular vein was identified with ultrasound. The skin overlying the jugular vein was infiltrated with 1% Xylocaine. A transversely oriented 5 mm incision was made and carried down through subcutaneous tissues. A needle was inserted into the jugular vein using ultrasound guidance. An image was saved for the paper chart. A guidewire was advanced down through the needle into the central circulation. Fluoroscopy was used to demonstrate the location of the guidewire in the vena cava. The dilator and introducer sheath were advanced over the guidewire. The guidewire and dilator were removed. The catheter was placed down through the sheath and the sheath was peeled away. Fluoroscopy was used to demonstrate the tip of the catheter in the superior vena cava. An image was saved for the paper chart. The catheter was tunneled to the subclavian incision and pressure held over the tunnel site. The catheter was cut to fit  and attached to the ClearView port and accessed with a Huber needle aspirating a trace of blood and flushing with 5 cc of heparinized saline solution. The port was placed into the subcutaneous pouch and sutured to the deep fascia with 4-0 silk. Hemostasis was intact. Subcutaneous tissues were approximated with 5-0 Vicryl. Both skin incisions were closed with 5-0 Monocryl subcuticular suture and LiquiBand. The patient tolerated surgery satisfactorily and was prepared for transfer to the recovery room.  Rochel Brome M.D.

## 2015-09-16 NOTE — Anesthesia Preprocedure Evaluation (Signed)
Anesthesia Evaluation  Patient identified by MRN, date of birth, ID band Patient awake    Reviewed: Allergy & Precautions, H&P , NPO status , Patient's Chart, lab work & pertinent test results, reviewed documented beta blocker date and time   History of Anesthesia Complications Negative for: history of anesthetic complications  Airway Mallampati: III  TM Distance: >3 FB Neck ROM: full    Dental no notable dental hx. (+) Teeth Intact   Pulmonary neg shortness of breath, sleep apnea and Continuous Positive Airway Pressure Ventilation , neg COPD, neg recent URI, former smoker,    Pulmonary exam normal breath sounds clear to auscultation       Cardiovascular Exercise Tolerance: Good hypertension, (-) angina(-) CAD, (-) Past MI, (-) Cardiac Stents and (-) CABG Normal cardiovascular exam(-) dysrhythmias (-) Valvular Problems/Murmurs Rhythm:regular Rate:Normal     Neuro/Psych neg Seizures  Neuromuscular disease (Chronic inflammatory demyelinating polyneuritis) negative psych ROS   GI/Hepatic negative GI ROS, Neg liver ROS,   Endo/Other  diabetes, Insulin Dependent  Renal/GU negative Renal ROS  negative genitourinary   Musculoskeletal   Abdominal   Peds  Hematology negative hematology ROS (+)   Anesthesia Other Findings Past Medical History:   CIDP (chronic inflammatory demyelinating polyn*              Diabetes mellitus without complication (HCC)                 Sleep apnea                                                  Hypertension                                                 CIDP (chronic inflammatory demyelinating polyn*              Elevated lipids                                              Reproductive/Obstetrics negative OB ROS                             Anesthesia Physical Anesthesia Plan  ASA: III  Anesthesia Plan: General   Post-op Pain Management:    Induction:    Airway Management Planned:   Additional Equipment:   Intra-op Plan:   Post-operative Plan:   Informed Consent: I have reviewed the patients History and Physical, chart, labs and discussed the procedure including the risks, benefits and alternatives for the proposed anesthesia with the patient or authorized representative who has indicated his/her understanding and acceptance.   Dental Advisory Given  Plan Discussed with: Anesthesiologist, CRNA and Surgeon  Anesthesia Plan Comments:         Anesthesia Quick Evaluation

## 2015-09-16 NOTE — Discharge Instructions (Signed)
Take Tylenol if needed for pain.  May shower.  Anticipate gamma globulin infusions each 3 months. Have home health nurses flush the port at 6 week intervals.

## 2015-09-16 NOTE — Transfer of Care (Signed)
Immediate Anesthesia Transfer of Care Note  Patient: Phillip Fitzgerald  Procedure(s) Performed: Procedure(s): INSERTION PORT-A-CATH (Right)  Patient Location: PACU  Anesthesia Type:General  Level of Consciousness: awake  Airway & Oxygen Therapy: Patient Spontanous Breathing and Patient connected to nasal cannula oxygen  Post-op Assessment: Report given to RN and Post -op Vital signs reviewed and stable  Post vital signs: Reviewed and stable  Last Vitals:  Filed Vitals:   09/16/15 0846 09/16/15 0847  BP:  171/79  Pulse:  69  Temp:  36.6 C  Resp: 18     Last Pain: There were no vitals filed for this visit.       Complications: No apparent anesthesia complications

## 2015-09-17 ENCOUNTER — Encounter: Payer: Self-pay | Admitting: Surgery

## 2015-09-28 ENCOUNTER — Encounter: Payer: Self-pay | Admitting: Family Medicine

## 2015-09-28 ENCOUNTER — Ambulatory Visit (INDEPENDENT_AMBULATORY_CARE_PROVIDER_SITE_OTHER): Payer: Medicare Other | Admitting: Family Medicine

## 2015-09-28 VITALS — BP 130/88 | HR 68 | Ht 74.0 in | Wt 227.0 lb

## 2015-09-28 DIAGNOSIS — I10 Essential (primary) hypertension: Secondary | ICD-10-CM

## 2015-09-28 DIAGNOSIS — Z1211 Encounter for screening for malignant neoplasm of colon: Secondary | ICD-10-CM | POA: Diagnosis not present

## 2015-09-28 MED ORDER — AMLODIPINE BESYLATE 10 MG PO TABS: 10.0000 mg | ORAL_TABLET | Freq: Every day | ORAL | Status: DC

## 2015-09-28 MED ORDER — TELMISARTAN-HCTZ 80-25 MG PO TABS: 1.0000 | ORAL_TABLET | Freq: Every day | ORAL | Status: DC

## 2015-09-28 MED ORDER — CARVEDILOL 12.5 MG PO TABS: ORAL_TABLET | ORAL | Status: DC

## 2015-09-28 NOTE — Addendum Note (Signed)
Addended by: Juline Patch on: 09/28/2015 09:44 AM   Modules accepted: Miquel Dunn

## 2015-09-28 NOTE — Progress Notes (Signed)
Name: Phillip Fitzgerald   MRN: [id number]    DOB: [birth year]   Date:09/28/2015       Progress Note  Subjective  Chief Complaint  Chief Complaint  Patient presents with  . Hypertension  . Allergic Rhinitis   . Colon Cancer Screening    just time for 5 yrs. repeat    Hypertension This is a chronic problem. The current episode started more than 1 year ago. The problem has been gradually improving since onset. The problem is controlled. Pertinent negatives include no anxiety, blurred vision, chest pain, headaches, malaise/fatigue, neck pain, orthopnea, palpitations, peripheral edema, PND, shortness of breath or sweats. There are no associated agents to hypertension. Risk factors for coronary artery disease include dyslipidemia. Past treatments include ACE inhibitors, calcium channel blockers, beta blockers and diuretics. The current treatment provides moderate improvement. There are no compliance problems.  There is no history of angina, kidney disease, CAD/MI, CVA, heart failure, left ventricular hypertrophy, PVD, renovascular disease or retinopathy. There is no history of chronic renal disease or a hypertension causing med.    No problem-specific assessment & plan notes found for this encounter.   Past Medical History  Diagnosis Date  . CIDP (chronic inflammatory demyelinating polyneuropathy) ([place])   . Diabetes mellitus without complication ([place])   . Sleep apnea   . Hypertension   . CIDP (chronic inflammatory demyelinating polyneuropathy) ([place])   . Elevated lipids     Past Surgical History  Procedure Laterality Date  . Appendectomy    . Tonsillectomy    . Knee surgery Right     scope  . Colonoscopy  2011    cleared for 5 yrs- Dr Allen Norris  . Portacath placement Right 09/16/2015    Procedure: INSERTION PORT-A-CATH;  Surgeon: Leonie Green, MD;  Location: [facility] ORS;  Service: General;  Laterality: Right;    Family History  Problem Relation Age of Onset  . Diabetes Father   .  Heart disease Father     Social History   Social History  . Marital Status: Married    Spouse Name: N/A  . Number of Children: N/A  . Years of Education: N/A   Occupational History  . Not on file.   Social History Main Topics  . Smoking status: Former Smoker    Quit date: 05/08/1978  . Smokeless tobacco: Not on file  . Alcohol Use: 1.8 oz/week    3 Cans of beer per week     Comment: none since tuesday 5/9, usually a glass of wine or 2 at dinner daily  . Drug Use: No  . Sexual Activity: Yes   Other Topics Concern  . Not on file   Social History Narrative    Allergies  Allergen Reactions  . Penicillins Other (See Comments)    Unknown reaction from chilhood     Review of Systems  Constitutional: Negative for fever, chills, weight loss and malaise/fatigue.  HENT: Negative for ear discharge, ear pain and sore throat.   Eyes: Negative for blurred vision.  Respiratory: Negative for cough, sputum production, shortness of breath and wheezing.   Cardiovascular: Negative for chest pain, palpitations, orthopnea, leg swelling and PND.  Gastrointestinal: Negative for heartburn, nausea, abdominal pain, diarrhea, constipation, blood in stool and melena.  Genitourinary: Negative for dysuria, urgency, frequency and hematuria.  Musculoskeletal: Negative for myalgias, back pain, joint pain and neck pain.  Skin: Negative for rash.  Neurological: Positive for focal weakness. Negative for dizziness, tingling, sensory change and headaches.  Endo/Heme/Allergies: Negative for environmental allergies and polydipsia. Does not bruise/bleed easily.  Psychiatric/Behavioral: Negative for depression and suicidal ideas. The patient is not nervous/anxious and does not have insomnia.      Objective  Filed Vitals:   09/28/15 0917  BP: 130/88  Pulse: 68  Height: 6\' 2"  (1.88 m)  Weight: 227 lb (102.967 kg)    Physical Exam  Constitutional: He is oriented to person, place, and time and  well-developed, well-nourished, and in no distress.  HENT:  Head: Normocephalic.  Right Ear: External ear normal.  Left Ear: External ear normal.  Nose: Nose normal.  Mouth/Throat: Oropharynx is clear and moist.  Eyes: Conjunctivae and EOM are normal. Pupils are equal, round, and reactive to light. Right eye exhibits no discharge. Left eye exhibits no discharge. No scleral icterus.  Neck: Normal range of motion. Neck supple. No JVD present. No tracheal deviation present. No thyromegaly present.  Cardiovascular: Normal rate, regular rhythm, normal heart sounds and intact distal pulses.  Exam reveals no gallop and no friction rub.   No murmur heard. Pulmonary/Chest: Breath sounds normal. No respiratory distress. He has no wheezes. He has no rales.  Abdominal: Soft. Bowel sounds are normal. He exhibits no mass. There is no hepatosplenomegaly. There is no tenderness. There is no rebound, no guarding and no CVA tenderness.  Musculoskeletal: Normal range of motion. He exhibits no edema or tenderness.  Lymphadenopathy:    He has no cervical adenopathy.  Neurological: He is alert and oriented to person, place, and time. He has normal sensation, normal strength and intact cranial nerves.  Skin: Skin is warm. No rash noted.  Psychiatric: Mood and affect normal.  Nursing note and vitals reviewed.     Assessment & Plan  Problem List Items Addressed This Visit      Cardiovascular and Mediastinum   BP (high blood pressure) - Primary   Relevant Medications   telmisartan-hydrochlorothiazide (MICARDIS HCT) 80-25 MG tablet   amLODipine (NORVASC) 10 MG tablet   carvedilol (COREG) 12.5 MG tablet        Dr. [facility]  09/28/2015

## 2015-09-28 NOTE — Addendum Note (Signed)
Addended by: Fredderick Severance on: 09/28/2015 10:37 AM   Modules accepted: Orders

## 2015-11-16 ENCOUNTER — Telehealth: Payer: Self-pay | Admitting: Gastroenterology

## 2015-11-16 NOTE — Telephone Encounter (Signed)
Patient is returning your call regarding a colonoscopy °

## 2015-11-17 ENCOUNTER — Other Ambulatory Visit: Payer: Self-pay | Admitting: Family Medicine

## 2015-11-20 ENCOUNTER — Other Ambulatory Visit: Payer: Self-pay | Admitting: Family Medicine

## 2015-12-03 ENCOUNTER — Other Ambulatory Visit: Payer: Self-pay

## 2015-12-03 DIAGNOSIS — I10 Essential (primary) hypertension: Secondary | ICD-10-CM

## 2015-12-03 MED ORDER — AMLODIPINE BESYLATE 10 MG PO TABS: 10.0000 mg | ORAL_TABLET | Freq: Every day | ORAL | 5 refills | Status: DC

## 2016-01-11 ENCOUNTER — Other Ambulatory Visit: Payer: Self-pay | Admitting: Family Medicine

## 2016-01-16 ENCOUNTER — Other Ambulatory Visit: Payer: Self-pay | Admitting: Family Medicine

## 2016-02-08 ENCOUNTER — Encounter: Payer: Self-pay | Admitting: Family Medicine

## 2016-02-08 ENCOUNTER — Ambulatory Visit (INDEPENDENT_AMBULATORY_CARE_PROVIDER_SITE_OTHER): Payer: Medicare Other | Admitting: Family Medicine

## 2016-02-08 VITALS — BP 160/72 | HR 70 | Ht 74.0 in | Wt 222.0 lb

## 2016-02-08 DIAGNOSIS — E782 Mixed hyperlipidemia: Secondary | ICD-10-CM

## 2016-02-08 DIAGNOSIS — I10 Essential (primary) hypertension: Secondary | ICD-10-CM | POA: Diagnosis not present

## 2016-02-08 DIAGNOSIS — Z794 Long term (current) use of insulin: Secondary | ICD-10-CM | POA: Diagnosis not present

## 2016-02-08 DIAGNOSIS — E119 Type 2 diabetes mellitus without complications: Secondary | ICD-10-CM

## 2016-02-08 DIAGNOSIS — J301 Allergic rhinitis due to pollen: Secondary | ICD-10-CM | POA: Diagnosis not present

## 2016-02-08 MED ORDER — LORATADINE 10 MG PO TABS: 10.0000 mg | ORAL_TABLET | Freq: Every day | ORAL | 11 refills | Status: DC

## 2016-02-08 MED ORDER — ROSUVASTATIN CALCIUM 20 MG PO TABS: 20.0000 mg | ORAL_TABLET | Freq: Every day | ORAL | 6 refills | Status: DC

## 2016-02-08 MED ORDER — METFORMIN HCL 500 MG PO TABS: 500.0000 mg | ORAL_TABLET | Freq: Two times a day (BID) | ORAL | 6 refills | Status: DC

## 2016-02-08 MED ORDER — INSULIN GLARGINE 100 UNIT/ML SOLOSTAR PEN: PEN_INJECTOR | SUBCUTANEOUS | 4 refills | Status: DC

## 2016-02-08 MED ORDER — AMLODIPINE BESYLATE 10 MG PO TABS: 10.0000 mg | ORAL_TABLET | Freq: Every day | ORAL | 5 refills | Status: DC

## 2016-02-08 MED ORDER — CARVEDILOL 12.5 MG PO TABS: 25.0000 mg | ORAL_TABLET | Freq: Two times a day (BID) | ORAL | 6 refills | Status: DC

## 2016-02-08 MED ORDER — TELMISARTAN-HCTZ 80-25 MG PO TABS: 1.0000 | ORAL_TABLET | Freq: Every day | ORAL | 6 refills | Status: DC

## 2016-02-08 NOTE — Progress Notes (Signed)
Name: Phillip Fitzgerald   MRN: [id number]    DOB: [birth year]   Date:02/08/2016       Progress Note  Subjective  Chief Complaint  Chief Complaint  Patient presents with  . Hypertension  . Allergic Rhinitis   . Diabetes    needs a RX for Lantus until can see Dr Maretta Bees- on maternity leave    Hypertension  This is a chronic problem. The current episode started more than 1 year ago. The problem has been waxing and waning since onset. The problem is controlled. Pertinent negatives include no anxiety, blurred vision, chest pain, headaches, malaise/fatigue, neck pain, orthopnea, palpitations, peripheral edema, PND, shortness of breath or sweats. There are no associated agents to hypertension. There are no known risk factors for coronary artery disease. Past treatments include angiotensin blockers, calcium channel blockers and beta blockers. The current treatment provides mild improvement. There are no compliance problems.  There is no history of angina, kidney disease, CAD/MI, CVA, heart failure, left ventricular hypertrophy, PVD, renovascular disease or retinopathy. There is no history of chronic renal disease or a hypertension causing med.  Diabetes  He presents for his follow-up diabetic visit. He has type 2 diabetes mellitus. His disease course has been stable. Pertinent negatives for hypoglycemia include no confusion, dizziness, headaches, hunger, mood changes, nervousness/anxiousness, pallor, seizures, sleepiness, speech difficulty, sweats or tremors. Pertinent negatives for diabetes include no blurred vision, no chest pain, no fatigue, no foot paresthesias, no foot ulcerations, no polydipsia, no polyphagia, no polyuria, no visual change, no weakness and no weight loss. There are no hypoglycemic complications. Symptoms are stable. There are no diabetic complications. Pertinent negatives for diabetic complications include no CVA, nephropathy, PVD or retinopathy. Current diabetic treatment includes insulin  injections and oral agent (monotherapy). He is compliant with treatment most of the time (off x 3day/ out of insulin). His weight is stable. He is following a generally healthy diet. He has not had a previous visit with a dietitian. He rarely participates in exercise. There is no change in his home blood glucose trend. His breakfast blood glucose is taken between 8-9 am. An ACE inhibitor/angiotensin II receptor blocker is being taken. He does not see a podiatrist.Eye exam is current.  URI   This is a chronic (allergic ) problem. The current episode started more than 1 year ago. The problem has been gradually improving. There has been no fever. Pertinent negatives include no abdominal pain, chest pain, coughing, diarrhea, dysuria, ear pain, headaches, joint pain, nausea, neck pain, rash, sore throat or wheezing.    No problem-specific Assessment & Plan notes found for this encounter.   Past Medical History:  Diagnosis Date  . CIDP (chronic inflammatory demyelinating polyneuropathy) ([place])   . CIDP (chronic inflammatory demyelinating polyneuropathy) ([place])   . Diabetes mellitus without complication ([place])   . Elevated lipids   . Hypertension   . Sleep apnea     Past Surgical History:  Procedure Laterality Date  . APPENDECTOMY    . COLONOSCOPY  2011   cleared for 5 yrs- Dr Allen Norris  . KNEE SURGERY Right    scope  . PORTACATH PLACEMENT Right 09/16/2015   Procedure: INSERTION PORT-A-CATH;  Surgeon: Leonie Green, MD;  Location: [facility] ORS;  Service: General;  Laterality: Right;  . TONSILLECTOMY      Family History  Problem Relation Age of Onset  . Diabetes Father   . Heart disease Father     Social History   Social History  .  Marital status: Married    Spouse name: N/A  . Number of children: N/A  . Years of education: N/A   Occupational History  . Not on file.   Social History Main Topics  . Smoking status: Former Smoker    Quit date: 05/08/1978  . Smokeless tobacco: Not on  file  . Alcohol use 1.8 oz/week    3 Cans of beer per week     Comment: none since tuesday 5/9, usually a glass of wine or 2 at dinner daily  . Drug use: No  . Sexual activity: Yes   Other Topics Concern  . Not on file   Social History Narrative  . No narrative on file    Allergies  Allergen Reactions  . Penicillins Other (See Comments)    Unknown reaction from chilhood     Review of Systems  Constitutional: Negative for chills, fatigue, fever, malaise/fatigue and weight loss.  HENT: Negative for ear discharge, ear pain and sore throat.   Eyes: Negative for blurred vision.  Respiratory: Negative for cough, sputum production, shortness of breath and wheezing.   Cardiovascular: Negative for chest pain, palpitations, orthopnea, leg swelling and PND.  Gastrointestinal: Negative for abdominal pain, blood in stool, constipation, diarrhea, heartburn, melena and nausea.  Genitourinary: Negative for dysuria, frequency, hematuria and urgency.  Musculoskeletal: Negative for back pain, joint pain, myalgias and neck pain.  Skin: Negative for pallor and rash.  Neurological: Negative for dizziness, tingling, tremors, sensory change, focal weakness, seizures, speech difficulty, weakness and headaches.  Endo/Heme/Allergies: Negative for environmental allergies, polydipsia and polyphagia. Does not bruise/bleed easily.  Psychiatric/Behavioral: Negative for confusion, depression and suicidal ideas. The patient is not nervous/anxious and does not have insomnia.      Objective  Vitals:   02/08/16 0806  BP: (!) 160/72  Pulse: 70  Weight: 222 lb (100.7 kg)  Height: 6\' 2"  (1.88 m)    Physical Exam  Constitutional: He is oriented to person, place, and time and well-developed, well-nourished, and in no distress.  HENT:  Head: Normocephalic.  Right Ear: Tympanic membrane, external ear and ear canal normal.  Left Ear: Tympanic membrane, external ear and ear canal normal.  Nose: Nose normal.   Mouth/Throat: Uvula is midline and oropharynx is clear and moist.  Eyes: Conjunctivae and EOM are normal. Pupils are equal, round, and reactive to light. Right eye exhibits no discharge. Left eye exhibits no discharge. No scleral icterus.  Neck: Normal range of motion. Neck supple. No JVD present. No tracheal deviation present. No thyromegaly present.  Cardiovascular: Normal rate, regular rhythm, normal heart sounds and intact distal pulses.  Exam reveals no gallop and no friction rub.   No murmur heard. Pulmonary/Chest: Breath sounds normal. No respiratory distress. He has no wheezes. He has no rales.  Abdominal: Soft. Bowel sounds are normal. He exhibits no mass. There is no hepatosplenomegaly. There is no tenderness. There is no rebound, no guarding and no CVA tenderness.  Musculoskeletal: Normal range of motion. He exhibits no edema or tenderness.  Lymphadenopathy:    He has no cervical adenopathy.  Neurological: He is alert and oriented to person, place, and time. He has normal sensation, normal strength and intact cranial nerves. No cranial nerve deficit.  Skin: Skin is warm. No rash noted.  Psychiatric: Mood and affect normal.  Nursing note and vitals reviewed.     Assessment & Plan  Problem List Items Addressed This Visit      Cardiovascular and Mediastinum   Essential  hypertension - Primary   Relevant Medications   carvedilol (COREG) 12.5 MG tablet   amLODipine (NORVASC) 10 MG tablet   telmisartan-hydrochlorothiazide (MICARDIS HCT) 80-25 MG tablet   rosuvastatin (CRESTOR) 20 MG tablet   Other Relevant Orders   Renal Function Panel     Respiratory   Allergic rhinitis, seasonal   Relevant Medications   loratadine (CLARITIN) 10 MG tablet     Endocrine   Diabetes (HCC)   Relevant Medications   telmisartan-hydrochlorothiazide (MICARDIS HCT) 80-25 MG tablet   Insulin Glargine (LANTUS SOLOSTAR) 100 UNIT/ML Solostar Pen   metFORMIN (GLUCOPHAGE) 500 MG tablet    rosuvastatin (CRESTOR) 20 MG tablet   Other Relevant Orders   Renal Function Panel   Hemoglobin A1c     Other   Hyperlipidemia   Relevant Medications   carvedilol (COREG) 12.5 MG tablet   amLODipine (NORVASC) 10 MG tablet   telmisartan-hydrochlorothiazide (MICARDIS HCT) 80-25 MG tablet   rosuvastatin (CRESTOR) 20 MG tablet   Other Relevant Orders   Lipid Profile    Other Visit Diagnoses   None.    I spent 25 minutes with this patient, More than 50% of that time was spent in face to face education, counseling and care coordination. Pt was given sample of Lantus- needs to sched appt with endo to be seen while waiting for Dr Maretta Bees to return  Dr. [facility]  02/08/16

## 2016-05-12 ENCOUNTER — Telehealth: Payer: Self-pay

## 2016-05-12 NOTE — Telephone Encounter (Signed)
Dr Ronnald Ramp said pt to stay on 12.5mg  of Carvedilol BID- recheck B/P in Feb- pharmacy notified

## 2016-05-12 NOTE — Telephone Encounter (Signed)
Please clarify Rx written on 02/08/2016 of Coreg per [facility]. Caryl Pina at [facility] said there are two instructions and two strengths in the [place]. Call her [phone]

## 2016-06-12 ENCOUNTER — Inpatient Hospital Stay: Payer: Medicare Other | Attending: Oncology | Admitting: Oncology

## 2016-06-12 ENCOUNTER — Ambulatory Visit: Payer: Medicare Other

## 2016-06-12 ENCOUNTER — Encounter: Payer: Self-pay | Admitting: Oncology

## 2016-06-12 VITALS — BP 174/93 | HR 84 | Temp 87.2°F | Resp 18 | Ht 74.0 in | Wt 221.7 lb

## 2016-06-12 DIAGNOSIS — I10 Essential (primary) hypertension: Secondary | ICD-10-CM | POA: Insufficient documentation

## 2016-06-12 DIAGNOSIS — Z801 Family history of malignant neoplasm of trachea, bronchus and lung: Secondary | ICD-10-CM | POA: Insufficient documentation

## 2016-06-12 DIAGNOSIS — E119 Type 2 diabetes mellitus without complications: Secondary | ICD-10-CM | POA: Diagnosis not present

## 2016-06-12 DIAGNOSIS — R5381 Other malaise: Secondary | ICD-10-CM

## 2016-06-12 DIAGNOSIS — E785 Hyperlipidemia, unspecified: Secondary | ICD-10-CM

## 2016-06-12 DIAGNOSIS — Z79899 Other long term (current) drug therapy: Secondary | ICD-10-CM | POA: Diagnosis not present

## 2016-06-12 DIAGNOSIS — Z880 Allergy status to penicillin: Secondary | ICD-10-CM | POA: Diagnosis not present

## 2016-06-12 DIAGNOSIS — G6181 Chronic inflammatory demyelinating polyneuritis: Secondary | ICD-10-CM | POA: Insufficient documentation

## 2016-06-12 DIAGNOSIS — Z87891 Personal history of nicotine dependence: Secondary | ICD-10-CM | POA: Insufficient documentation

## 2016-06-12 DIAGNOSIS — G4730 Sleep apnea, unspecified: Secondary | ICD-10-CM | POA: Diagnosis not present

## 2016-06-12 DIAGNOSIS — Z794 Long term (current) use of insulin: Secondary | ICD-10-CM | POA: Diagnosis not present

## 2016-06-12 DIAGNOSIS — R5383 Other fatigue: Secondary | ICD-10-CM | POA: Diagnosis not present

## 2016-06-12 DIAGNOSIS — Z452 Encounter for adjustment and management of vascular access device: Secondary | ICD-10-CM | POA: Diagnosis not present

## 2016-06-12 NOTE — Progress Notes (Signed)
Patient presents today as a new patient. No major complaints  Patient has a high bp reading of 174/93 but he had just taken his medication right before arriving.

## 2016-06-12 NOTE — Progress Notes (Signed)
Hematology/Oncology Consult note [facility] [phone] [phone]  Patient Care Team: Juline Patch, MD as PCP - General (Family Medicine)   Name of the patient: Phillip Fitzgerald  [id number]  April 28, 1951    Reason for referral- IVIG for CIDP   Referring physician- Dr. Jennings Books  Date of visit: 06/12/16   History of presenting illness- patient is a 66 year old male who was diagnosed with chronic inflammation in demyelinating polyneuropathy (CIDP) in 2012. Dr. Jennings Books is his outpatient neurologist and he has been getting IVIG infusion at home 2 mg/kg over 4 days every 3 months maintenance regimen. His last dose was in October 2017 and he was due for a repeat dose in January 2018. However insurance denied home infusions and he is required to receive infusions at the infusion center and hence has been referred to [place] for the same. Patient's last visit with Dr. Manuella Ghazi was in July 2017. Patient states that he has been tolerating his IVIG well without any significant side effects. At one point in the past he did have a rash in his palms as well as developed a headache when it was given too fast. He has not had any side effects recently. He has responded well to IVIG and is now able to walk with a cane. When he was initially diagnosed in 2012 he was unable to lift his whole leg up and also had difficulty with his hands.[facility] EMG in April 2012 showed abnormal electrodiagnostic study consistent with generalized severe acquired and sensory motor polyneuropathy with neuropathic changes on the legs and all limbs. Lumbar puncture showed mildly basic protein was 0.6 normal IgG synthesis rate was -8.8 normal he has 0 on oligoclonal bands in his CSF. He was also seen at [facility] for second opinion and diagnosis was confirmed. He does have a port in place to receive IVIG.  ECOG PS- 1  Pain scale- 0   Review of systems- Review of Systems  Constitutional: Positive for  malaise/fatigue. Negative for chills, fever and weight loss.  HENT: Negative for congestion, ear discharge and nosebleeds.   Eyes: Negative for blurred vision.  Respiratory: Negative for cough, hemoptysis, sputum production, shortness of breath and wheezing.   Cardiovascular: Negative for chest pain, palpitations, orthopnea and claudication.  Gastrointestinal: Negative for abdominal pain, blood in stool, constipation, diarrhea, heartburn, melena, nausea and vomiting.  Genitourinary: Negative for dysuria, flank pain, frequency, hematuria and urgency.  Musculoskeletal: Negative for back pain, joint pain and myalgias.       Muscle weakness  Skin: Negative for rash.  Neurological: Negative for dizziness, tingling, focal weakness, seizures, weakness and headaches.  Endo/Heme/Allergies: Does not bruise/bleed easily.  Psychiatric/Behavioral: Negative for depression and suicidal ideas. The patient does not have insomnia.     Allergies  Allergen Reactions  . Penicillins Other (See Comments)    Unknown reaction from chilhood    Patient Active Problem List   Diagnosis Date Noted  . Hyperlipidemia 12/28/2014  . Adiposity 11/02/2014  . Chronic inflammatory demyelinating polyneuritis ([place]) 09/10/2013  . Diabetes ([place]) 09/10/2013  . Essential hypertension 09/10/2013  . Obstructive apnea 09/10/2013  . Allergic rhinitis, seasonal 09/10/2013     Past Medical History:  Diagnosis Date  . CIDP (chronic inflammatory demyelinating polyneuropathy) ([place])   . CIDP (chronic inflammatory demyelinating polyneuropathy) ([place])   . Diabetes mellitus without complication ([place])   . Elevated lipids   . Hypertension   . Sleep apnea      Past Surgical History:  Procedure Laterality Date  . APPENDECTOMY    . COLONOSCOPY  2011   cleared for 5 yrs- Dr Allen Norris  . KNEE SURGERY Right    scope  . PORTACATH PLACEMENT Right 09/16/2015   Procedure: INSERTION PORT-A-CATH;  Surgeon: Leonie Green, MD;  Location:  [facility] ORS;  Service: General;  Laterality: Right;  . TONSILLECTOMY      Social History   Social History  . Marital status: Married    Spouse name: N/A  . Number of children: N/A  . Years of education: N/A   Occupational History  . Not on file.   Social History Main Topics  . Smoking status: Former Smoker    Quit date: 05/08/1978  . Smokeless tobacco: Never Used  . Alcohol use 1.8 oz/week    3 Cans of beer per week     Comment: none since tuesday 5/9, usually a glass of wine or 2 at dinner daily  . Drug use: No  . Sexual activity: Yes   Other Topics Concern  . Not on file   Social History Narrative  . No narrative on file     Family History  Problem Relation Age of Onset  . Diabetes Father   . Heart disease Father   . Angina Mother   . Macular degeneration Mother   . Lung cancer Sister      Current Outpatient Prescriptions:  .  amLODipine (NORVASC) 10 MG tablet, Take 1 tablet (10 mg total) by mouth daily., Disp: 30 tablet, Rfl: 5 .  carvedilol (COREG) 12.5 MG tablet, Take 2 tablets (25 mg total) by mouth 2 (two) times daily with a meal. TAKE 1 TABLET (12.5 MG TOTAL) BY MOUTH 2 (TWO) TIMES DAILY WITH A MEAL., Disp: 60 tablet, Rfl: 6 .  Immune Globulin 10% (GAMUNEX-C) 10 GM/100ML SOLN, Inject into the vein every 3 (three) months. 50 grams- Dr Manuella Ghazi, Disp: , Rfl:  .  Insulin Glargine (LANTUS SOLOSTAR) 100 UNIT/ML Solostar Pen, INJECT 46 UNITS UNDER THE SKIN AT BEDTIME- Dr Maretta Bees, Disp: 15 mL, Rfl: 4 .  insulin lispro (HUMALOG) 100 UNIT/ML injection, Inject into the skin 3 (three) times daily before meals. 6-12 units with meals- Dr Maretta Bees, Disp: , Rfl:  .  loratadine (CLARITIN) 10 MG tablet, Take 1 tablet (10 mg total) by mouth daily., Disp: 30 tablet, Rfl: 11 .  metFORMIN (GLUCOPHAGE) 500 MG tablet, Take 1 tablet (500 mg total) by mouth 2 (two) times daily., Disp: 60 tablet, Rfl: 6 .  rosuvastatin (CRESTOR) 20 MG tablet, Take 1 tablet (20 mg total) by mouth at bedtime. Dr Maretta Bees,  Disp: 30 tablet, Rfl: 6 .  telmisartan-hydrochlorothiazide (MICARDIS HCT) 80-25 MG tablet, Take 1 tablet by mouth daily., Disp: 30 tablet, Rfl: 6 .  venlafaxine (EFFEXOR) 75 MG tablet, Take 1 tablet by mouth 2 (two) times daily. Dr Manuella Ghazi, Disp: , Rfl:    Physical exam:  Vitals:   06/12/16 0900  BP: (!) 174/93  Pulse: 84  Resp: 18  Temp: (!) 87.2 F (30.7 C)  TempSrc: Tympanic  Weight: 221 lb 10.8 oz (100.5 kg)  Height: 6\' 2"  (1.88 m)   Physical Exam  Constitutional: He is oriented to person, place, and time and well-developed, well-nourished, and in no distress.  HENT:  Head: Normocephalic and atraumatic.  Eyes: EOM are normal. Pupils are equal, round, and reactive to light.  Neck: Normal range of motion.  Cardiovascular: Normal rate, regular rhythm and normal heart sounds.   Pulmonary/Chest: Effort normal and  breath sounds normal.  Abdominal: Soft. Bowel sounds are normal.  Neurological: He is alert and oriented to person, place, and time.  Full neurological exam was not done. Intrinsic muscles of the hand shows atrial fibrillation. There is proximal muscle atrophy noted in his thighs.  Skin: Skin is warm and dry.       CMP Latest Ref Rng & Units 12/25/2014  Glucose 65 - 99 mg/dL 165(H)  BUN 8 - 27 mg/dL 20  Creatinine 0.76 - 1.27 mg/dL 1.22  Sodium 134 - 144 mmol/L 137  Potassium 3.5 - 5.2 mmol/L 4.0  Chloride 97 - 108 mmol/L 90(L)  CO2 18 - 29 mmol/L 23  Calcium 8.6 - 10.2 mg/dL 9.7  Total Protein 6.5 - 8.1 g/dL -  Total Bilirubin 0.3 - 1.2 mg/dL -  Alkaline Phos 38 - 126 U/L -  AST 15 - 41 U/L -  ALT 17 - 63 U/L -   CBC Latest Ref Rng & Units 09/10/2014  WBC 3.8 - 10.6 K/uL 4.1  Hemoglobin 13.0 - 18.0 g/dL 15.0  Hematocrit 40.0 - 52.0 % 43.6  Platelets 150 - 440 K/uL 124(L)     Assessment and plan- Patient is a 66 y.o. male with a h/o CIDP diagnosed in 2012 and is on maintenance IVIG every 3 months  1. I again explained the risks and benefits of IVIG  including all but not limited to rash, headache, thromboembolic episodes, renal failure, hemolytic anemia and neutropenia. We will arrange for IVIG infusion next week. He will be getting 2gm/kg over 4 days per my conversation with Dr. Manuella Ghazi today.  Prior to that patient will require following things:  A. Visit with Dr. Jennings Books preferably this week to document continued need for IVIG  B. Dose and course of IVIG as well as written prescription for the same.  C. CBC/ CMP to be done at Dr. Trena Platt office prior to (within a week) every cycle of IVIG (ie every 3 months)  D. Insurance authroization to receive IVIG at [facility].  2. I will see the patient back in 3 months prior to his next cycle of IVIG. Patient understands that he needs to get in touch with Dr. Trena Platt office if he were to have any side effects or questions related to IVIG after he completes his infusion.   Thank you for this kind referral and the opportunity to participate in the care of this patient   Visit Diagnosis 1. Chronic inflammatory demyelinating polyneuritis (HCC)     Dr. Randa Evens, MD, MPH [facility] Pager- [phone] 06/12/2016 9:32 AM

## 2016-06-16 ENCOUNTER — Other Ambulatory Visit: Payer: Self-pay | Admitting: Oncology

## 2016-06-16 DIAGNOSIS — Z9989 Dependence on other enabling machines and devices: Secondary | ICD-10-CM | POA: Diagnosis not present

## 2016-06-16 DIAGNOSIS — R200 Anesthesia of skin: Secondary | ICD-10-CM | POA: Diagnosis not present

## 2016-06-16 DIAGNOSIS — G4733 Obstructive sleep apnea (adult) (pediatric): Secondary | ICD-10-CM | POA: Diagnosis not present

## 2016-06-16 DIAGNOSIS — G6181 Chronic inflammatory demyelinating polyneuritis: Secondary | ICD-10-CM | POA: Diagnosis not present

## 2016-06-16 DIAGNOSIS — R29898 Other symptoms and signs involving the musculoskeletal system: Secondary | ICD-10-CM | POA: Diagnosis not present

## 2016-06-19 ENCOUNTER — Inpatient Hospital Stay: Payer: Medicare Other

## 2016-06-19 ENCOUNTER — Other Ambulatory Visit: Payer: Self-pay | Admitting: *Deleted

## 2016-06-19 ENCOUNTER — Telehealth: Payer: Self-pay | Admitting: *Deleted

## 2016-06-19 ENCOUNTER — Other Ambulatory Visit: Payer: Self-pay | Admitting: Oncology

## 2016-06-19 DIAGNOSIS — R5383 Other fatigue: Secondary | ICD-10-CM | POA: Diagnosis not present

## 2016-06-19 DIAGNOSIS — I10 Essential (primary) hypertension: Secondary | ICD-10-CM | POA: Diagnosis not present

## 2016-06-19 DIAGNOSIS — G6181 Chronic inflammatory demyelinating polyneuritis: Secondary | ICD-10-CM | POA: Diagnosis not present

## 2016-06-19 DIAGNOSIS — Z452 Encounter for adjustment and management of vascular access device: Secondary | ICD-10-CM | POA: Diagnosis not present

## 2016-06-19 DIAGNOSIS — E785 Hyperlipidemia, unspecified: Secondary | ICD-10-CM | POA: Diagnosis not present

## 2016-06-19 DIAGNOSIS — R5381 Other malaise: Secondary | ICD-10-CM | POA: Diagnosis not present

## 2016-06-19 LAB — COMPREHENSIVE METABOLIC PANEL
ALBUMIN: 4.3 g/dL (ref 3.5–5.0)
ALK PHOS: 59 U/L (ref 38–126)
ALT: 27 U/L (ref 17–63)
ANION GAP: 11 (ref 5–15)
AST: 33 U/L (ref 15–41)
BUN: 34 mg/dL — ABNORMAL HIGH (ref 6–20)
CALCIUM: 9 mg/dL (ref 8.9–10.3)
CO2: 30 mmol/L (ref 22–32)
Chloride: 90 mmol/L — ABNORMAL LOW (ref 101–111)
Creatinine, Ser: 1.79 mg/dL — ABNORMAL HIGH (ref 0.61–1.24)
GFR calc Af Amer: 44 mL/min — ABNORMAL LOW (ref >60)
GFR calc non Af Amer: 38 mL/min — ABNORMAL LOW (ref >60)
GLUCOSE: 370 mg/dL — AB (ref 65–99)
Potassium: 3.6 mmol/L (ref 3.5–5.1)
SODIUM: 131 mmol/L — AB (ref 135–145)
Total Bilirubin: 0.6 mg/dL (ref 0.3–1.2)
Total Protein: 8.1 g/dL (ref 6.5–8.1)

## 2016-06-19 LAB — CBC WITH DIFFERENTIAL/PLATELET
BASOS PCT: 1 %
Basophils Absolute: 0.1 10*3/uL (ref 0–0.1)
EOS ABS: 0.2 10*3/uL (ref 0–0.7)
Eosinophils Relative: 3 %
HCT: 40.6 % (ref 40.0–52.0)
HEMOGLOBIN: 13.9 g/dL (ref 13.0–18.0)
Lymphocytes Relative: 24 %
Lymphs Abs: 2.1 10*3/uL (ref 1.0–3.6)
MCH: 33.3 pg (ref 26.0–34.0)
MCHC: 34.1 g/dL (ref 32.0–36.0)
MCV: 97.7 fL (ref 80.0–100.0)
Monocytes Absolute: 0.8 10*3/uL (ref 0.2–1.0)
Monocytes Relative: 9 %
NEUTROS PCT: 63 %
Neutro Abs: 5.7 10*3/uL (ref 1.4–6.5)
Platelets: 166 10*3/uL (ref 150–440)
RBC: 4.16 MIL/uL — AB (ref 4.40–5.90)
RDW: 12.7 % (ref 11.5–14.5)
WBC: 8.9 10*3/uL (ref 3.8–10.6)

## 2016-06-19 MED ORDER — SODIUM CHLORIDE 0.9% FLUSH
10.0000 mL | INTRAVENOUS | Status: DC | PRN
  Administered 2016-06-19: 10 mL via INTRAVENOUS
  Filled 2016-06-19: qty 10

## 2016-06-19 MED ORDER — HEPARIN SOD (PORK) LOCK FLUSH 100 UNIT/ML IV SOLN
500.0000 [IU] | Freq: Once | INTRAVENOUS | Status: AC
  Administered 2016-06-19: 500 [IU] via INTRAVENOUS
  Filled 2016-06-19: qty 5

## 2016-06-19 NOTE — Telephone Encounter (Signed)
Called neurology and the secretary  Can't find an order or auth. She will send message to the staff and specifically written rx with instructions and auth approved for IVIG.  So I told staff to call me back and I will check with them on daily basis to see if it is done so we can get pt started.  I also called pt. And let him know that we do not have approval or rx to do IVIG and I will check with office on daily basis to get it going. Pt agreeable to not coming today and understands when we get the orders and authorization I will let him know. In between calling him the staff at neurology called back and said she now has rx, she will work on authorization and fax it to me. She is also waiting on dr Manuella Ghazi to get note done

## 2016-06-20 ENCOUNTER — Inpatient Hospital Stay: Payer: Medicare Other

## 2016-06-20 ENCOUNTER — Telehealth: Payer: Self-pay | Admitting: *Deleted

## 2016-06-20 VITALS — BP 145/77 | HR 69 | Temp 96.7°F | Resp 18

## 2016-06-20 DIAGNOSIS — Z452 Encounter for adjustment and management of vascular access device: Secondary | ICD-10-CM | POA: Diagnosis not present

## 2016-06-20 DIAGNOSIS — G6181 Chronic inflammatory demyelinating polyneuritis: Secondary | ICD-10-CM | POA: Diagnosis not present

## 2016-06-20 DIAGNOSIS — R5381 Other malaise: Secondary | ICD-10-CM | POA: Diagnosis not present

## 2016-06-20 DIAGNOSIS — E785 Hyperlipidemia, unspecified: Secondary | ICD-10-CM | POA: Diagnosis not present

## 2016-06-20 DIAGNOSIS — R5383 Other fatigue: Secondary | ICD-10-CM | POA: Diagnosis not present

## 2016-06-20 DIAGNOSIS — I10 Essential (primary) hypertension: Secondary | ICD-10-CM | POA: Diagnosis not present

## 2016-06-20 MED ORDER — ACETAMINOPHEN 325 MG PO TABS
650.0000 mg | ORAL_TABLET | Freq: Once | ORAL | Status: AC
  Administered 2016-06-20: 650 mg via ORAL
  Filled 2016-06-20: qty 2

## 2016-06-20 MED ORDER — HEPARIN SOD (PORK) LOCK FLUSH 100 UNIT/ML IV SOLN
INTRAVENOUS | Status: AC
  Filled 2016-06-20: qty 5

## 2016-06-20 MED ORDER — SODIUM CHLORIDE 0.9 % IV SOLN
Freq: Once | INTRAVENOUS | Status: AC
  Administered 2016-06-20: 09:00:00 via INTRAVENOUS
  Filled 2016-06-20: qty 1000

## 2016-06-20 MED ORDER — SODIUM CHLORIDE 0.9% FLUSH
10.0000 mL | INTRAVENOUS | Status: DC | PRN
  Administered 2016-06-20: 10 mL via INTRAVENOUS
  Filled 2016-06-20: qty 10

## 2016-06-20 MED ORDER — HEPARIN SOD (PORK) LOCK FLUSH 100 UNIT/ML IV SOLN
500.0000 [IU] | Freq: Once | INTRAVENOUS | Status: AC
  Administered 2016-06-20: 500 [IU] via INTRAVENOUS

## 2016-06-20 MED ORDER — DIPHENHYDRAMINE HCL 25 MG PO CAPS
25.0000 mg | ORAL_CAPSULE | Freq: Once | ORAL | Status: AC
  Administered 2016-06-20: 25 mg via ORAL
  Filled 2016-06-20: qty 1

## 2016-06-20 MED ORDER — IMMUNE GLOBULIN (HUMAN) 20 GM/200ML IV SOLN
500.0000 mg/kg | Freq: Every day | INTRAVENOUS | Status: DC
  Administered 2016-06-20: 50 g via INTRAVENOUS
  Filled 2016-06-20: qty 100

## 2016-06-20 NOTE — Telephone Encounter (Signed)
Called and spoke to nurse with dr Manuella Ghazi and asked if we should give IVIG with creat 1.7 and was told yes that it is ok but pt will need creat check in 2 days and also they will make ref for pt to see nephrology and call pt with appt.  Patient already coming Thursday to [place] so we will add on encounter for lab on that date

## 2016-06-21 ENCOUNTER — Other Ambulatory Visit: Payer: Self-pay | Admitting: *Deleted

## 2016-06-21 ENCOUNTER — Other Ambulatory Visit: Payer: Self-pay | Admitting: Oncology

## 2016-06-21 ENCOUNTER — Inpatient Hospital Stay: Payer: Medicare Other

## 2016-06-21 ENCOUNTER — Telehealth: Payer: Self-pay | Admitting: *Deleted

## 2016-06-21 VITALS — BP 132/73 | HR 68 | Temp 96.8°F | Resp 18

## 2016-06-21 DIAGNOSIS — G6181 Chronic inflammatory demyelinating polyneuritis: Secondary | ICD-10-CM | POA: Diagnosis not present

## 2016-06-21 DIAGNOSIS — R5381 Other malaise: Secondary | ICD-10-CM | POA: Diagnosis not present

## 2016-06-21 DIAGNOSIS — R5383 Other fatigue: Secondary | ICD-10-CM | POA: Diagnosis not present

## 2016-06-21 DIAGNOSIS — I10 Essential (primary) hypertension: Secondary | ICD-10-CM | POA: Diagnosis not present

## 2016-06-21 DIAGNOSIS — Z452 Encounter for adjustment and management of vascular access device: Secondary | ICD-10-CM | POA: Diagnosis not present

## 2016-06-21 DIAGNOSIS — E785 Hyperlipidemia, unspecified: Secondary | ICD-10-CM | POA: Diagnosis not present

## 2016-06-21 MED ORDER — HEPARIN SOD (PORK) LOCK FLUSH 100 UNIT/ML IV SOLN
INTRAVENOUS | Status: AC
  Filled 2016-06-21: qty 5

## 2016-06-21 MED ORDER — IMMUNE GLOBULIN (HUMAN) 20 GM/200ML IV SOLN
50.0000 g | INTRAVENOUS | Status: DC
  Administered 2016-06-21: 09:00:00 50 g via INTRAVENOUS
  Filled 2016-06-21: qty 100

## 2016-06-21 MED ORDER — HEPARIN SOD (PORK) LOCK FLUSH 100 UNIT/ML IV SOLN
500.0000 [IU] | Freq: Once | INTRAVENOUS | Status: AC
  Administered 2016-06-21: 500 [IU] via INTRAVENOUS

## 2016-06-21 MED ORDER — DIPHENHYDRAMINE HCL 25 MG PO TABS
25.0000 mg | ORAL_TABLET | Freq: Once | ORAL | Status: AC
  Administered 2016-06-21: 25 mg via ORAL
  Filled 2016-06-21: qty 1

## 2016-06-21 MED ORDER — SODIUM CHLORIDE 0.9% FLUSH
10.0000 mL | INTRAVENOUS | Status: DC | PRN
  Filled 2016-06-21: qty 10

## 2016-06-21 MED ORDER — SODIUM CHLORIDE 0.9 % IV SOLN
Freq: Once | INTRAVENOUS | Status: AC
  Administered 2016-06-21: 09:00:00 via INTRAVENOUS
  Filled 2016-06-21: qty 1000

## 2016-06-21 MED ORDER — ACETAMINOPHEN 325 MG PO TABS
650.0000 mg | ORAL_TABLET | Freq: Once | ORAL | Status: AC
  Administered 2016-06-21: 650 mg via ORAL
  Filled 2016-06-21: qty 2

## 2016-06-21 NOTE — Patient Instructions (Signed)
Patient received 2nd  IVIG infusion this week. Patient instructed to call his neurologist for any question, concerns or side effects related to this infusion. Pt voiced understanding.

## 2016-06-21 NOTE — Telephone Encounter (Signed)
Called chemo area in [place] and spoke to [place] and asked her to tell pt that he is already scheduled for thurs in [place] to get day 3 of IVIG but he needs come in earlier at 8:30 because we have to check his creat. Again per dr Manuella Ghazi office.  She will give the message to pt

## 2016-06-22 ENCOUNTER — Inpatient Hospital Stay: Payer: Medicare Other

## 2016-06-22 ENCOUNTER — Other Ambulatory Visit: Payer: Self-pay | Admitting: Oncology

## 2016-06-22 VITALS — BP 128/58 | HR 67 | Temp 96.6°F | Resp 18

## 2016-06-22 DIAGNOSIS — R5383 Other fatigue: Secondary | ICD-10-CM | POA: Diagnosis not present

## 2016-06-22 DIAGNOSIS — E785 Hyperlipidemia, unspecified: Secondary | ICD-10-CM | POA: Diagnosis not present

## 2016-06-22 DIAGNOSIS — G6181 Chronic inflammatory demyelinating polyneuritis: Secondary | ICD-10-CM | POA: Diagnosis not present

## 2016-06-22 DIAGNOSIS — I10 Essential (primary) hypertension: Secondary | ICD-10-CM | POA: Diagnosis not present

## 2016-06-22 DIAGNOSIS — R5381 Other malaise: Secondary | ICD-10-CM | POA: Diagnosis not present

## 2016-06-22 DIAGNOSIS — Z452 Encounter for adjustment and management of vascular access device: Secondary | ICD-10-CM | POA: Diagnosis not present

## 2016-06-22 LAB — CREATININE, SERUM
CREATININE: 1.39 mg/dL — AB (ref 0.61–1.24)
GFR, EST AFRICAN AMERICAN: 59 mL/min — AB (ref >60)
GFR, EST NON AFRICAN AMERICAN: 51 mL/min — AB (ref >60)

## 2016-06-22 MED ORDER — IMMUNE GLOBULIN (HUMAN) 20 GM/200ML IV SOLN
500.0000 mg/kg | INTRAVENOUS | Status: DC
  Administered 2016-06-22: 50 g via INTRAVENOUS
  Filled 2016-06-22: qty 100

## 2016-06-22 MED ORDER — DIPHENHYDRAMINE HCL 25 MG PO TABS
25.0000 mg | ORAL_TABLET | Freq: Once | ORAL | Status: AC
  Administered 2016-06-22: 25 mg via ORAL
  Filled 2016-06-22: qty 1

## 2016-06-22 MED ORDER — ACETAMINOPHEN 325 MG PO TABS
650.0000 mg | ORAL_TABLET | Freq: Once | ORAL | Status: AC
  Administered 2016-06-22: 650 mg via ORAL
  Filled 2016-06-22: qty 2

## 2016-06-22 MED ORDER — HEPARIN SOD (PORK) LOCK FLUSH 100 UNIT/ML IV SOLN
500.0000 [IU] | Freq: Once | INTRAVENOUS | Status: AC | PRN
  Administered 2016-06-22: 500 [IU]
  Filled 2016-06-22: qty 5

## 2016-06-22 MED ORDER — SODIUM CHLORIDE 0.9 % IV SOLN
Freq: Once | INTRAVENOUS | Status: AC
  Administered 2016-06-22: 10:00:00 via INTRAVENOUS
  Filled 2016-06-22: qty 1000

## 2016-06-23 ENCOUNTER — Inpatient Hospital Stay: Payer: Medicare Other

## 2016-06-23 VITALS — BP 117/69 | HR 58 | Temp 97.0°F | Resp 18

## 2016-06-23 DIAGNOSIS — E785 Hyperlipidemia, unspecified: Secondary | ICD-10-CM | POA: Diagnosis not present

## 2016-06-23 DIAGNOSIS — Z452 Encounter for adjustment and management of vascular access device: Secondary | ICD-10-CM | POA: Diagnosis not present

## 2016-06-23 DIAGNOSIS — G6181 Chronic inflammatory demyelinating polyneuritis: Secondary | ICD-10-CM

## 2016-06-23 DIAGNOSIS — R5381 Other malaise: Secondary | ICD-10-CM | POA: Diagnosis not present

## 2016-06-23 DIAGNOSIS — R5383 Other fatigue: Secondary | ICD-10-CM | POA: Diagnosis not present

## 2016-06-23 DIAGNOSIS — I10 Essential (primary) hypertension: Secondary | ICD-10-CM | POA: Diagnosis not present

## 2016-06-23 LAB — THYROID PANEL
Free Thyroxine Index: 2 (ref 1.2–4.9)
T3 UPTAKE RATIO: 29 % (ref 24–39)
T4, Total: 7 ug/dL (ref 4.5–12.0)

## 2016-06-23 MED ORDER — DIPHENHYDRAMINE HCL 25 MG PO CAPS
25.0000 mg | ORAL_CAPSULE | Freq: Once | ORAL | Status: AC
  Administered 2016-06-23: 25 mg via ORAL
  Filled 2016-06-23: qty 1

## 2016-06-23 MED ORDER — SODIUM CHLORIDE 0.9 % IV SOLN
INTRAVENOUS | Status: DC
  Administered 2016-06-23: 09:00:00 via INTRAVENOUS
  Filled 2016-06-23: qty 1000

## 2016-06-23 MED ORDER — ACETAMINOPHEN 325 MG PO TABS
650.0000 mg | ORAL_TABLET | Freq: Once | ORAL | Status: AC
  Administered 2016-06-23: 650 mg via ORAL
  Filled 2016-06-23: qty 2

## 2016-06-23 MED ORDER — HEPARIN SOD (PORK) LOCK FLUSH 100 UNIT/ML IV SOLN
500.0000 [IU] | Freq: Once | INTRAVENOUS | Status: AC
  Administered 2016-06-23: 500 [IU] via INTRAVENOUS

## 2016-06-23 MED ORDER — IMMUNE GLOBULIN (HUMAN) 20 GM/200ML IV SOLN
50.0000 g | Freq: Once | INTRAVENOUS | Status: AC
  Administered 2016-06-23: 50 g via INTRAVENOUS
  Filled 2016-06-23: qty 100

## 2016-06-23 MED ORDER — SODIUM CHLORIDE 0.9% FLUSH
10.0000 mL | INTRAVENOUS | Status: DC | PRN
  Administered 2016-06-23: 10 mL via INTRAVENOUS
  Filled 2016-06-23: qty 10

## 2016-06-23 NOTE — Addendum Note (Signed)
Addended by: Zara Chess on: 06/23/2016 02:48 PM   Modules accepted: Orders, SmartSet

## 2016-07-03 ENCOUNTER — Ambulatory Visit: Payer: Medicare Other | Attending: Neurology | Admitting: Occupational Therapy

## 2016-07-03 DIAGNOSIS — R278 Other lack of coordination: Secondary | ICD-10-CM | POA: Insufficient documentation

## 2016-07-03 DIAGNOSIS — M6281 Muscle weakness (generalized): Secondary | ICD-10-CM | POA: Diagnosis not present

## 2016-07-03 NOTE — Patient Instructions (Addendum)
OT TREATMENT     Neuro muscular re-education:  Therapeutic Exercise:  Pt. was provided with pink theraputty, and a HEP with a visual handout. Pt. worked on pink theraputty resistive exercises for gross grip, gross digit extension, thumb abduction, lateral, 3pt. and 2pt. Pinch strength, wrist extension, supination, and thumb opposition. Visual demonstration, and verbal cues were provided.  Selfcare:  Manual Therapy:

## 2016-07-03 NOTE — Therapy (Signed)
[place] MAIN [facility] SERVICES [address] [place], [place], [address] Phone: [phone]   Fax:  [phone]  Occupational Therapy Treatment  Patient Details  Name: Phillip Fitzgerald MRN: [id number] Date of Birth: [birth year] Referring Provider: Dr. Manuella Ghazi  Encounter Date: 07/03/2016      OT End of Session - 07/03/16 1611    Visit Number 1   Number of Visits 24   Date for OT Re-Evaluation 09/25/16   Authorization Type Medicare G code 1   Activity Tolerance Patient tolerated treatment well   Behavior During Therapy [facility] for tasks assessed/performed      Past Medical History:  Diagnosis Date  . CIDP (chronic inflammatory demyelinating polyneuropathy) ([place])   . CIDP (chronic inflammatory demyelinating polyneuropathy) ([place])   . Diabetes mellitus without complication ([place])   . Elevated lipids   . Hypertension   . Sleep apnea     Past Surgical History:  Procedure Laterality Date  . APPENDECTOMY    . COLONOSCOPY  2011   cleared for 5 yrs- Dr Allen Norris  . KNEE SURGERY Right    scope  . PORTACATH PLACEMENT Right 09/16/2015   Procedure: INSERTION PORT-A-CATH;  Surgeon: Leonie Green, MD;  Location: [facility] ORS;  Service: General;  Laterality: Right;  . TONSILLECTOMY      There were no vitals filed for this visit.      Subjective Assessment - 07/03/16 1539    Subjective  Pt. reports reports he would like to be able to use his right index finger again.   Pertinent History Pt. is a 66 y.o. who was initially diagnosed with CIDP in February of 2013. Pt. has recently experienced weakness in his right hand functioning limiting his ability to complete ADL, and IADL tasks.    Patient Stated Goals To be able to use his right hand, and index finger again.   Currently in Pain? No/denies            [facility] OT Assessment - 07/03/16 0001      Assessment   Diagnosis CIDP   Referring Provider Dr. Manuella Ghazi   Onset Date 06/26/11     Precautions    Precautions None     Balance Screen   Has the patient fallen in the past 6 months No   Has the patient had a decrease in activity level because of a fear of falling?  Yes   Is the patient reluctant to leave their home because of a fear of falling?  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 indicating  the following tasks as very hard to do: zipping a jacket, picking up a 1/2 full pitcher of water,  and turning a key. Pt indicated the follwoing tasks as a liitle hard to do: cutting nails, cutting meat, opening a medication bottle with a child proof cap, taking items out of a wallet, handling, and counting money (bills and coins).      IADL   Shopping Shops independently for small purchases   Light Housekeeping Does personal laundry completely   Meal Prep Plans, prepares and serves adequate meals independently  difficulty with cutting during cooking.   Medication Management Is responsible for taking medication in correct dosages at correct time   Pillbox   Financial Management Manages financial matters independently (budgets, writes checks, pays rent, bills goes to bank), collects and keeps track of income     Written Expression   Dominant Hand Right   Handwriting 50% legible     Vision - History   Baseline Vision Bifocals     Activity Tolerance   Activity Tolerance Tolerates < 10 min activity with changes in vital signs     Sensation   Light Touch Appears Intact   Proprioception Appears Intact     Coordination   Right 9 Hole Peg Test 24   Left 9 Hole Peg Test 25     Strength   Overall Strength Comments RUE strength 4/5 for shoulder flexion, etension, abduction, elbow flexion extension 5/5, wrist flexion, extension 4/5. LUE 5/5 overall     Hand Function   Right Hand Grip (lbs) 39   Right Hand Lateral Pinch 15 lbs   Right Hand 3 Point Pinch 9 lbs   Left Hand Grip (lbs) 66   Left Hand Lateral Pinch 23 lbs   Left 3 point pinch 16 lbs                  OT Treatments/Exercises (OP) - 07/03/16 0001      Neurological Re-education Exercises   Other Exercises 1 Pt. was provided with pink theraputty, and a HEP with a visual handout. Pt. worked on pink theraputty resistive exercises for gross grip, gross digit extension, thumb abduction, lateral, 3pt. and 2pt. Pinch strength, wrist extension, supination, and thumb opposition. Visual demonstrattion, and verbal cues were provided.                     OT Long Term Goals - 07/03/16 1623      OT LONG TERM GOAL #1   Title Pt. will increase RUE strength by 2 mm grades to assist with ADL/IADLs   Baseline limited RUE strength   Time 12   Period Weeks   Status New     OT LONG TERM GOAL #2   Title Pt. will increase right grip strength by 10# to be able to hold and fill dog bowls.   Baseline Right grip 39#, Left Grip 66#   Time 12   Period Weeks   Status New     OT LONG TERM GOAL #3   Title Pt. will improve right hand coordination to be able to  independently and efficiently turn pages in a magazine   Baseline Pt. has difficulty, R: 24 sec. wiith dropping.   Time 12   Period Weeks   Status New     OT LONG TERM GOAL #4   Title Pt. will improve  right pinch lateral strength by 3# to tbe able to use a key.    Baseline Right; 15#, Left 23#   Time 12   Period Weeks   Status New     OT LONG TERM GOAL #5   Title Pt. will improve 3 point pinch to be able to manage zippers.   Baseline right: 9#, Left: 16#    Time 12   Period Weeks   Status New     OT LONG TERM GOAL #6   Title Pt. will improve independently write a 3 sentance paragraph efficiently with 100% legibility.   Baseline 50% legibility   Time 12   Period Weeks   Status New               Plan - 07/03/16 1612    Clinical Impression Statement Pt. is a 66 y.o. male who was initially diagnosed with CIDP in February of 2013. Pt. has more recently experienced weakness, atrophy, and limited [facility] skills in the right hand limiting his ability to complete ADL and IADL tasks including; buttoning, zipping, turning pages in a magazine, turning a key to lock, and unlock doors, lifting dogs bowls, wringing out a towel, opening medication bottles, writing legibly, combing/brushing hair, and taking items out of a wallet.  Pt. presented with a sum score of 66 on MAM-20.  Pt. could benefit from skilled OT services to work on improving dominant RUE and hand strength, and coordination skills for use during ADLs, and IADLs.   Rehab Potential Good   OT Frequency 2x / week   OT Duration 12 weeks   OT Treatment/Interventions Moist Heat;Patient/family education;Therapeutic exercises;Therapeutic activities;Energy conservation;Visual/perceptual remediation/compensation;Neuromuscular education;Passive range of motion;Therapeutic exercise;Self-care/ADL training   OT Home Exercise Plan HEP for pink theraputty ex.   Consulted and Agree with Plan of Care Patient      Patient will benefit from  skilled therapeutic intervention in order to improve the following deficits and impairments:  Decreased coordination, Decreased strength, Decreased mobility, Decreased balance, Impaired UE functional use, Decreased activity tolerance  Visit Diagnosis: Muscle weakness (generalized)  Other lack of coordination    Problem List Patient Active Problem List   Diagnosis Date Noted  . Hyperlipidemia 12/28/2014  . Adiposity 11/02/2014  . Chronic inflammatory demyelinating polyneuritis ([place]) 09/10/2013  . Diabetes ([place]) 09/10/2013  . Essential hypertension 09/10/2013  . Obstructive apnea 09/10/2013  . Allergic rhinitis, seasonal 09/10/2013    Harrel Carina, MS, OTR/L 07/03/2016, 5:28 PM  [place] MAIN [facility] SERVICES [address] [place], [place], [address] Phone: [phone]   Fax:  [phone]  Name: Phillip Fitzgerald MRN: [id number] Date of Birth: 1950/09/27

## 2016-07-06 ENCOUNTER — Encounter: Payer: Self-pay | Admitting: Occupational Therapy

## 2016-07-06 ENCOUNTER — Ambulatory Visit: Payer: Medicare Other | Attending: Neurology | Admitting: Occupational Therapy

## 2016-07-06 DIAGNOSIS — R278 Other lack of coordination: Secondary | ICD-10-CM | POA: Diagnosis not present

## 2016-07-06 DIAGNOSIS — M6281 Muscle weakness (generalized): Secondary | ICD-10-CM

## 2016-07-06 NOTE — Therapy (Signed)
[place] MAIN [facility] SERVICES [address] [place], [place], [address] Phone: [phone]   Fax:  [phone]  Occupational Therapy Treatment  Patient Details  Name: Phillip Fitzgerald MRN: [id number] Date of Birth: 1950/09/12 Referring Provider: Dr. Manuella Ghazi  Encounter Date: 07/06/2016      OT End of Session - 07/06/16 1614    Visit Number 2   Number of Visits 24   Date for OT Re-Evaluation 09/25/16   Authorization Type Medicare G code 2   OT Start Time [id number]   OT Stop Time 1600   OT Time Calculation (min) 45 min   Activity Tolerance Patient tolerated treatment well   Behavior During Therapy [facility] for tasks assessed/performed      Past Medical History:  Diagnosis Date  . CIDP (chronic inflammatory demyelinating polyneuropathy) ([place])   . CIDP (chronic inflammatory demyelinating polyneuropathy) ([place])   . Diabetes mellitus without complication ([place])   . Elevated lipids   . Hypertension   . Sleep apnea     Past Surgical History:  Procedure Laterality Date  . APPENDECTOMY    . COLONOSCOPY  2011   cleared for 5 yrs- Dr Allen Norris  . KNEE SURGERY Right    scope  . PORTACATH PLACEMENT Right 09/16/2015   Procedure: INSERTION PORT-A-CATH;  Surgeon: Leonie Green, MD;  Location: [facility] ORS;  Service: General;  Laterality: Right;  . TONSILLECTOMY      There were no vitals filed for this visit.      Subjective Assessment - 07/06/16 1613    Subjective  Pt. reports he can feel that the theraputty is working.   Pertinent History Pt. is a 66 y.o. who was initially diagnosed with CIDP in February of 2013. Pt. has recently experienced weakness in his right hand functioning limiting his ability to complete ADL, and IADL tasks.    Patient Stated Goals To be able to use his right hand, and index finger again.   Currently in Pain? No/denies                      OT Treatments/Exercises (OP) - 07/06/16 1633      Fine Motor Coordination    Other Fine Motor Exercises Pt. performed [facility] skills training to improve speed and dexterity needed for ADL tasks and writing. Pt. demonstrated grasping 1 inch sticks,  inch cylindrical collars, and  inch flat washers on the Purdue pegboard. Pt. performed grasping each item with her 2nd digit and thumb, and storing them in the palm. Pt. presented with difficulty storing  inch objects at a time in the palmar aspect of the hand. Pt. worked on alternating bilateral hand coordination skills. Pt. performed right hand [facility] tasks using the Grooved pegboard. Pt. worked on grasping the grooved pegs from a horizontal position, and moving the pegs to a vertical position in the hand to prepare for placing them in the grooved slot. Pt. worked on grasping 1" resistive cubes with thumb opposition to the tip of the 2nd through 5th digits. Pt. Pt. worked on replacing them with alternating isolating 2nd through 5th digits to press them into place.      Neurological Re-education Exercises   Other Exercises 1 Pt. performed gross gripping with grip strengthener. Pt. worked on sustaining grip while grasping pegs and reaching at various heights. Gripper was placed in the 3rd resistive slot with the white resistive spring. Pt. Worked on pinch strengthening in the left hand for lateral, and 3pt.  pinch using yellow, red, green, and blue resistive clips. Pt. worked on placing the clips at various vertical and horizontal angles. Tactile and verbal cues were required for eliciting the desired movement. Pt. worked on the digiflex  at 1.5# of resistance. Pt. worked on gripping together, followed by each digit individually.                      OT Long Term Goals - 07/03/16 1623      OT LONG TERM GOAL #1   Title Pt. will increase RUE strength by 2 mm grades to assist with ADL/IADLs   Baseline limited RUE strength   Time 12   Period Weeks   Status New     OT LONG TERM GOAL #2   Title Pt. will increase right grip  strength by 10# to be able to hold and fill dog bowls.   Baseline Right grip 39#, Left Grip 66#   Time 12   Period Weeks   Status New     OT LONG TERM GOAL #3   Title Pt. will improve right hand coordination to be able to independnetly and efficiently turn pages in a magazine   Baseline Pt. has difficulty   Time 12   Period Weeks   Status New     OT LONG TERM GOAL #4   Title Pt. will improve right pinch lateral strength by 3# to tbe able to use a key.    Baseline Right; 15#, Left 23#   Time 12   Period Weeks   Status New     OT LONG TERM GOAL #5   Title Pt. will improve 3 point pinch to be able to manage zippers.   Baseline right: 9#, Left: 16#    Time 12   Period Weeks   Status New     OT LONG TERM GOAL #6   Title Pt. will improve independently write a 3 sentance paragraph efficiently with 100% legibility.   Baseline 50% legibility   Time 12   Period Weeks   Status New               Plan - 07/06/16 1615    Clinical Impression Statement Pt. is making progress with right hand [facility] skills, and translatory movements of the hand. Pt. continues to work on improving right hand grip strength, and lateral, 3pt pinch srength. Pt. continues to present with weakness, and atrophy. in the right hand. Pt. continues to benefit from skilled OT services for improving hand function for ADLs, and IADLs.   Rehab Potential Good   OT Frequency 2x / week   OT Duration 12 weeks   OT Treatment/Interventions Moist Heat;Patient/family education;Therapeutic exercises;Therapeutic activities;Energy conservation;Visual/perceptual remediation/compensation;Neuromuscular education;Passive range of motion;Therapeutic exercise;Self-care/ADL training   Consulted and Agree with Plan of Care Patient      Patient will benefit from skilled therapeutic intervention in order to improve the following deficits and impairments:  Decreased coordination, Decreased strength, Decreased mobility, Decreased balance,  Impaired UE functional use, Decreased activity tolerance  Visit Diagnosis: Muscle weakness (generalized)  Other lack of coordination    Problem List Patient Active Problem List   Diagnosis Date Noted  . Hyperlipidemia 12/28/2014  . Adiposity 11/02/2014  . Chronic inflammatory demyelinating polyneuritis ([place]) 09/10/2013  . Diabetes ([place]) 09/10/2013  . Essential hypertension 09/10/2013  . Obstructive apnea 09/10/2013  . Allergic rhinitis, seasonal 09/10/2013    Harrel Carina, MS, OTR/L 07/06/2016, 4:39 PM  [place]  [place], [place], [address] Phone: [phone]   Fax:  [phone]  Name: Phillip Fitzgerald MRN: [id number] Date of Birth: [birth year]

## 2016-07-06 NOTE — Patient Instructions (Signed)
OT TREATMENT     Neuro muscular re-education:  Pt. performed [facility] skills training to improve speed and dexterity needed for ADL tasks and writing. Pt. demonstrated grasping 1 inch sticks,  inch cylindrical collars, and  inch flat washers on the Purdue pegboard. Pt. performed grasping each item with her 2nd digit and thumb, and storing them in the palm. Pt. presented with difficulty storing  inch objects at a time in the palmar aspect of the hand. Pt. worked on alternating bilateral hand coordination skills. Pt. performed right hand [facility] tasks using the Grooved pegboard. Pt. worked on grasping the grooved pegs from a horizontal position, and moving the pegs to a vertical position in the hand to prepare for placing them in the grooved slot. Pt. worked on grasping 1" resistive cubes with thumb opposition to the tip of the 2nd through 5th digits. Pt. Pt. worked on replacing them with alternating isolating 2nd through 5th digits to press them into place.   Therapeutic Exercise:  Pt. performed gross gripping with grip strengthener. Pt. worked on sustaining grip while grasping pegs and reaching at various heights. Gripper was placed in the 3rd resistive slot with the white resistive spring. Pt. Worked on pinch strengthening in the left hand for lateral, and 3pt. pinch using yellow, red, green, and blue resistive clips. Pt. worked on placing the clips at various vertical and horizontal angles. Tactile and verbal cues were required for eliciting the desired movement. Pt. worked on the digiflex  at 1.5# of resistance.        Selfcare:  Manual Therapy:

## 2016-07-10 ENCOUNTER — Ambulatory Visit: Payer: Medicare Other | Admitting: Occupational Therapy

## 2016-07-10 DIAGNOSIS — M6281 Muscle weakness (generalized): Secondary | ICD-10-CM | POA: Diagnosis not present

## 2016-07-10 DIAGNOSIS — R278 Other lack of coordination: Secondary | ICD-10-CM

## 2016-07-10 NOTE — Patient Instructions (Signed)
OT TREATMENT     Neuro muscular re-education:  Pt. worked on tasks to sustain lateral pinch on resistive tweezers while grasping and moving 2" toothpick sticks from a horizontal flat position to a vertical position in order to place it in the holder. Pt. was able to sustain grasp while positioning and extending the wrist/hand in the necessary alignment needed to place the stick through the top of the holder. Pt. performed [facility] skills training to improve speed and dexterity needed for ADL tasks and writing. Pt. demonstrated grasping 1 inch sticks,  inch cylindrical collars, and  inch flat washers on the Purdue pegboard. Pt. performed grasping each item with her 2nd digit and thumb, and storing them in the palm. Pt. presented with difficulty storing  inch objects at a time in the palmar aspect of the hand.pt.worked on grasping mini clips alternating thumb opposition to the tip of the 2nd through 5th digits.  Therapeutic Exercise:  Pt. performed gross gripping with grip strengthener. Pt. worked on sustaining grip while grasping pegs and reaching at various heights. Gripper was placed in the 3rd resistive slot with the white resistive spring.  Selfcare:  Manual Therapy:

## 2016-07-10 NOTE — Therapy (Addendum)
[place] MAIN [facility] SERVICES [address] [place], [place], [address] Phone: [phone]   Fax:  [phone]  Occupational Therapy Treatment  Patient Details  Name: Phillip Fitzgerald MRN: [id number] Date of Birth: 22-Apr-1951 Referring Provider: Dr. Manuella Ghazi  Encounter Date: 07/10/2016    Past Medical History:  Diagnosis Date  . CIDP (chronic inflammatory demyelinating polyneuropathy) ([place])   . CIDP (chronic inflammatory demyelinating polyneuropathy) ([place])   . Diabetes mellitus without complication ([place])   . Elevated lipids   . Hypertension   . Sleep apnea     Past Surgical History:  Procedure Laterality Date  . APPENDECTOMY    . COLONOSCOPY  2011   cleared for 5 yrs- Dr Allen Norris  . KNEE SURGERY Right    scope  . PORTACATH PLACEMENT Right 09/16/2015   Procedure: INSERTION PORT-A-CATH;  Surgeon: Leonie Green, MD;  Location: [facility] ORS;  Service: General;  Laterality: Right;  . TONSILLECTOMY      There were no vitals filed for this visit.      Subjective Assessment - 07/10/16 1556    Subjective  Pt. Reports his hand was sore over the weekend.                      OT Treatments/Exercises (OP) - 07/10/16 0001      Neurological Re-education Exercises   Other Exercises 1 Pt. worked on tasks to sustain lateral pinch on resistive tweezers while grasping and moving 2" toothpick sticks from a horizontal flat position to a vertical position in order to place it in the holder. Pt. was able to sustain grasp while positioning and extending the wrist/hand in the necessary alignment needed to place the stick through the top of the holder. Pt. performed [facility] skills training to improve speed and dexterity needed for ADL tasks and writing. Pt. demonstrated grasping 1 inch sticks,  inch cylindrical collars, and  inch flat washers on the Purdue pegboard. Pt. performed grasping each item with her 2nd digit and thumb, and storing them in the  palm. Pt. presented with difficulty storing  inch objects at a time in the palmar aspect of the hand.pt.worked on grasping mini clips alternating thumb opposition to the tip of the 2nd through 5th digits.   Other Exercises 2 Pt. performed gross gripping with grip strengthener. Pt. worked on sustaining grip while grasping pegs and reaching at various heights. Gripper was placed in the 3rd resistive slot with the white resistive spring.                     OT Long Term Goals - 07/03/16 1623      OT LONG TERM GOAL #1   Title Pt. will increase RUE strength by 2 mm grades to assist with ADL/IADLs   Baseline limited RUE strength   Time 12   Period Weeks   Status New     OT LONG TERM GOAL #2   Title Pt. will increase right grip strength by 10# to be able to hold and fill dog bowls.   Baseline Right grip 39#, Left Grip 66#   Time 12   Period Weeks   Status New     OT LONG TERM GOAL #3   Title Pt. will improve right hand coordination to be able to independnetly and efficiently turn pages in a magazine   Baseline Pt. has difficulty   Time 12   Period Weeks   Status New  OT LONG TERM GOAL #4   Title Pt. will improve right pinch lateral strength by 3# to tbe able to use a key.    Baseline Right; 15#, Left 23#   Time 12   Period Weeks   Status New     OT LONG TERM GOAL #5   Title Pt. will improve 3 point pinch to be able to manage zippers.   Baseline right: 9#, Left: 16#    Time 12   Period Weeks   Status New     OT LONG TERM GOAL #6   Title Pt. will improve independently write a 3 sentance paragraph efficiently with 100% legibility.   Baseline 50% legibility   Time 12   Period Weeks   Status New               Plan - 07/10/16 1610    Clinical Impression Statement Pt. continues to make progress with right hand functioning. Pt. continues to require work on improving UE strength, and coordination skills for use during ADL, and IADL tasks.   Rehab Potential  Good   OT Frequency 2x / week   OT Duration 12 weeks   OT Treatment/Interventions Moist Heat;Patient/family education;Therapeutic exercises;Therapeutic activities;Energy conservation;Visual/perceptual remediation/compensation;Neuromuscular education;Passive range of motion;Therapeutic exercise;Self-care/ADL training   Consulted and Agree with Plan of Care Patient      Patient will benefit from skilled therapeutic intervention in order to improve the following deficits and impairments:  Decreased coordination, Decreased strength, Decreased mobility, Decreased balance, Impaired UE functional use, Decreased activity tolerance  Visit Diagnosis: Muscle weakness (generalized)  Other lack of coordination    Problem List Patient Active Problem List   Diagnosis Date Noted  . Hyperlipidemia 12/28/2014  . Adiposity 11/02/2014  . Chronic inflammatory demyelinating polyneuritis ([place]) 09/10/2013  . Diabetes ([place]) 09/10/2013  . Essential hypertension 09/10/2013  . Obstructive apnea 09/10/2013  . Allergic rhinitis, seasonal 09/10/2013    Harrel Carina, MS, OTR/L 07/10/2016, 4:16 PM  [place] MAIN [facility] SERVICES [address] [place], [place], [address] Phone: [phone]   Fax:  [phone]  Name: Phillip Fitzgerald MRN: [id number] Date of Birth: 1950-12-31

## 2016-07-12 ENCOUNTER — Ambulatory Visit: Payer: Medicare Other | Admitting: Occupational Therapy

## 2016-07-12 DIAGNOSIS — R278 Other lack of coordination: Secondary | ICD-10-CM

## 2016-07-12 DIAGNOSIS — M6281 Muscle weakness (generalized): Secondary | ICD-10-CM | POA: Diagnosis not present

## 2016-07-12 NOTE — Patient Instructions (Signed)
OT TREATMENT     Neuro muscular re-education:  Pt. performed [facility] skills training to improve speed and dexterity needed for ADL tasks and writing. Pt. demonstrated grasping 1 inch sticks,  inch cylindrical collars, and  inch flat washers on the Purdue pegboard. Pt. performed grasping each item with her 2nd digit and thumb, and storing them in the palm. Pt. presented with difficulty storing  inch objects at a time in the palmar aspect of the hand. Pt. Worked on bilateral alternating hand patterns. Pt. Worked on opening medication bottles. Pt.worked on manipulating screws of varying sizes on the bolt tower. Pt. Dropped 2 bolts when removing the the nuts.   Therapeutic Exercise:  Pt. Worked on the [employer] for 8 min. With constant monitoring of the BUEs. Pt. Worked on changing, and alternating forward reverse position every 2 min. Pt. worked on the digiflex 1.5#. With the right hand. Pt. Hand difficulty coordinating the 4th and 5th digits.  Selfcare:  Manual Therapy:

## 2016-07-12 NOTE — Therapy (Signed)
[place] MAIN [facility] SERVICES [address] [place], [place], [address] Phone: [phone]   Fax:  [phone]  Occupational Therapy Treatment  Patient Details  Name: Phillip Fitzgerald MRN: [id number] Date of Birth: 05-24-1950 Referring Provider: Dr. Manuella Ghazi  Encounter Date: 07/12/2016      OT End of Session - 07/12/16 1723    Visit Number 3   Number of Visits 24   Date for OT Re-Evaluation 09/25/16   Authorization Type Medicare G code 3   OT Start Time 1300   OT Stop Time 1345   OT Time Calculation (min) 45 min   Activity Tolerance Patient tolerated treatment well   Behavior During Therapy [facility] for tasks assessed/performed      Past Medical History:  Diagnosis Date  . CIDP (chronic inflammatory demyelinating polyneuropathy) ([place])   . CIDP (chronic inflammatory demyelinating polyneuropathy) ([place])   . Diabetes mellitus without complication ([place])   . Elevated lipids   . Hypertension   . Sleep apnea     Past Surgical History:  Procedure Laterality Date  . APPENDECTOMY    . COLONOSCOPY  2011   cleared for 5 yrs- Dr Allen Norris  . KNEE SURGERY Right    scope  . PORTACATH PLACEMENT Right 09/16/2015   Procedure: INSERTION PORT-A-CATH;  Surgeon: Leonie Green, MD;  Location: [facility] ORS;  Service: General;  Laterality: Right;  . TONSILLECTOMY      There were no vitals filed for this visit.      Subjective Assessment - 07/12/16 1721    Subjective  Pt. reports reports working with the theraputty at home.   Pertinent History Pt. is a 66 y.o. who was initially diagnosed with CIDP in February of 2013. Pt. has recently experienced weakness in his right hand functioning limiting his ability to complete ADL, and IADL tasks.    Currently in Pain? No/denies  Discomfort at the 2nd & 3rd MP                      OT Treatments/Exercises (OP) - 07/12/16 1737      Fine Motor Coordination   Other Fine Motor Exercises Pt. performed [facility]  skills training to improve speed and dexterity needed for ADL tasks and writing. Pt. demonstrated grasping 1 inch sticks,  inch cylindrical collars, and  inch flat washers on the [employer]urdue pegboard. Pt. performed grasping each item with her 2nd digit and thumb, and storing them in the palm. Pt. presented with difficulty storing  inch objects at a time in the palmar aspect of the hand. Pt. Worked on bilateral alternating hand patterns. Pt. Worked on opening medication bottles. Pt.worked on manipulating screws of varying sizes on the bolt tower. Pt. Dropped 2 bolts when removing the the nuts.      Neurological Re-education Exercises   Other Exercises 1 Pt. Worked on the [employer] for 8 min. With constant monitoring of the BUEs. Pt. Worked on changing, and alternating forward reverse position every 2 min. Pt. worked on the digiflex 1.5#. With the right hand. Pt. Hand difficulty coordinating the 4th and 5th digits.                     OT Long Term Goals - 07/03/16 1623      OT LONG TERM GOAL #1   Title Pt. will increase RUE strength by 2 mm grades to assist with ADL/IADLs   Baseline limited RUE strength   Time 12  Period Weeks   Status New     OT LONG TERM GOAL #2   Title Pt. will increase right grip strength by 10# to be able to hold and fill dog bowls.   Baseline Right grip 39#, Left Grip 66#   Time 12   Period Weeks   Status New     OT LONG TERM GOAL #3   Title Pt. will improve right hand coordination to be able to independnetly and efficiently turn pages in a magazine   Baseline Pt. has difficulty   Time 12   Period Weeks   Status New     OT LONG TERM GOAL #4   Title Pt. will improve right pinch lateral strength by 3# to tbe able to use a key.    Baseline Right; 15#, Left 23#   Time 12   Period Weeks   Status New     OT LONG TERM GOAL #5   Title Pt. will improve 3 point pinch to be able to manage zippers.   Baseline right: 9#, Left: 16#    Time 12   Period Weeks    Status New     OT LONG TERM GOAL #6   Title Pt. will improve independently write a 3 sentance paragraph efficiently with 100% legibility.   Baseline 50% legibility   Time 12   Period Weeks   Status New               Plan - 07/12/16 1724    Clinical Impression Statement Pt. tolerated the BUE UBE better today, and did not require rest breaks. Pt. continues to work on improving RUE and hand strength, and coordination skills. Pt. reports feeling discomfort at the 2nd and 3rd MP with intrinsic stretches. Pt. reports discomfort tends to moves form day to day. Pt.. reports feeling it more in his hand, because he has not been working with his hand, and doing exercises with his hand. Pt. continues to work on improving strength, and [facility] skills with his right hand for use during ADLs, and IADLs. Pt. education was provided about modifying  exercises, and HEP, and home.   Rehab Potential Good   OT Frequency 2x / week   OT Duration 12 weeks   OT Treatment/Interventions Moist Heat;Patient/family education;Therapeutic exercises;Therapeutic activities;Energy conservation;Visual/perceptual remediation/compensation;Neuromuscular education;Passive range of motion;Therapeutic exercise;Self-care/ADL training   Consulted and Agree with Plan of Care Patient      Patient will benefit from skilled therapeutic intervention in order to improve the following deficits and impairments:  Decreased coordination, Decreased strength, Decreased mobility, Decreased balance, Impaired UE functional use, Decreased activity tolerance  Visit Diagnosis: Other lack of coordination  Muscle weakness (generalized)    Problem List Patient Active Problem List   Diagnosis Date Noted  . Hyperlipidemia 12/28/2014  . Adiposity 11/02/2014  . Chronic inflammatory demyelinating polyneuritis ([place]) 09/10/2013  . Diabetes ([place]) 09/10/2013  . Essential hypertension 09/10/2013  . Obstructive apnea 09/10/2013  . Allergic rhinitis,  seasonal 09/10/2013    Harrel Carina, MS, OTR/L 07/12/2016, 5:39 PM  [place] MAIN [facility] SERVICES [address] [place], [place], [address] Phone: [phone]   Fax:  [phone]  Name: Phillip Fitzgerald MRN: [id number] Date of Birth: 14-Apr-1951

## 2016-07-17 ENCOUNTER — Ambulatory Visit: Payer: Medicare Other | Admitting: Occupational Therapy

## 2016-07-19 ENCOUNTER — Ambulatory Visit: Payer: Medicare Other | Admitting: Occupational Therapy

## 2016-07-19 DIAGNOSIS — R278 Other lack of coordination: Secondary | ICD-10-CM | POA: Diagnosis not present

## 2016-07-19 DIAGNOSIS — M6281 Muscle weakness (generalized): Secondary | ICD-10-CM | POA: Diagnosis not present

## 2016-07-19 NOTE — Patient Instructions (Signed)
OT TREATMENT     Neuro muscular re-education:  Pt. performed [facility] skills training to improve speed and dexterity needed for ADL tasks and writing. Pt. demonstrated grasping 1 inch sticks,  inch cylindrical collars, and  inch flat washers on the [employer] pegboard. Pt. performed grasping each item with her 2nd digit and thumb, and storing them in the palm. Pt. presented with difficulty storing  inch objects at a time in the palmar aspect of the hand. Pt. performed [facility] tasks using the Grooved pegboard. Pt. worked on grasping the grooved pegs from a horizontal position, and moving the pegs to a vertical position in the hand to prepare for placing them in the grooved slot. Pt. Worked on grasping standard clothespins. Pt. worked on grasping, and flipping pegs on the [employer] pegboard.   Therapeutic Exercise:  Pt. Worked on the [employer] for 4 min. With constant monitoring of the BUEs. Pt. Worked on changing, and alternating forward reverse position every 2 min. Rest breaks were required. Pt. Worked on lateral in strength with the red resistive clips.  Selfcare:  Manual Therapy:

## 2016-07-19 NOTE — Therapy (Signed)
[place] MAIN [facility] SERVICES [address] [place], [place], [address] Phone: [phone]   Fax:  [phone]  Occupational Therapy Treatment  Patient Details  Name: Phillip Fitzgerald MRN: [id number] Date of Birth: 15-Oct-1950 Referring Provider: Dr. Manuella Ghazi  Encounter Date: 07/19/2016      OT End of Session - 07/19/16 1642    Visit Number 4   Number of Visits 24   Date for OT Re-Evaluation 09/25/16   Authorization Type Medicare G code 4   OT Start Time 4193   OT Stop Time 1345   OT Time Calculation (min) 40 min   Activity Tolerance Patient tolerated treatment well   Behavior During Therapy [facility] for tasks assessed/performed      Past Medical History:  Diagnosis Date  . CIDP (chronic inflammatory demyelinating polyneuropathy) ([place])   . CIDP (chronic inflammatory demyelinating polyneuropathy) ([place])   . Diabetes mellitus without complication ([place])   . Elevated lipids   . Hypertension   . Sleep apnea     Past Surgical History:  Procedure Laterality Date  . APPENDECTOMY    . COLONOSCOPY  2011   cleared for 5 yrs- Dr Allen Norris  . KNEE SURGERY Right    scope  . PORTACATH PLACEMENT Right 09/16/2015   Procedure: INSERTION PORT-A-CATH;  Surgeon: Leonie Green, MD;  Location: [facility] ORS;  Service: General;  Laterality: Right;  . TONSILLECTOMY      There were no vitals filed for this visit.      Subjective Assessment - 07/19/16 1329    Subjective  (P)  Pt. reports he thinks he over did it with the theraputty exercises at home yesterday.   Pertinent History (P)  Pt. is a 66 y.o. who was initially diagnosed with CIDP in February of 2013. Pt. has recently experienced weakness in his right hand functioning limiting his ability to complete ADL, and IADL tasks.    Currently in Pain? (P)  No/denies                      OT Treatments/Exercises (OP) - 07/19/16 1703      Fine Motor Coordination   Other Fine Motor Exercises Pt.  performed [facility] skills training to improve speed and dexterity needed for ADL tasks and writing. Pt. demonstrated grasping 1 inch sticks,  inch cylindrical collars, and  inch flat washers on the [employer] pegboard. Pt. performed grasping each item with her 2nd digit and thumb, and storing them in the palm. Pt. presented with difficulty storing  inch objects at a time in the palmar aspect of the hand. Pt. performed [facility] tasks using the Grooved pegboard. Pt. worked on grasping the grooved pegs from a horizontal position, and moving the pegs to a vertical position in the hand to prepare for placing them in the grooved slot. Pt. Worked on grasping standard clothespins. Pt. worked on grasping, and flipping pegs on the [employer]o pegboard.      Neurological Re-education Exercises   Other Exercises 1 Pt. Worked on the [employer] for 4 min. With constant monitoring of the BUEs. Pt. Worked on changing, and alternating forward reverse position every 2 min. Rest breaks were required. Pt. Worked on lateral in strength with the red resistive clips.                     OT Long Term Goals - 07/03/16 1623      OT LONG TERM GOAL #1   Title Pt.  will increase RUE strength by 2 mm grades to assist with ADL/IADLs   Baseline limited RUE strength   Time 12   Period Weeks   Status New     OT LONG TERM GOAL #2   Title Pt. will increase right grip strength by 10# to be able to hold and fill dog bowls.   Baseline Right grip 39#, Left Grip 66#   Time 12   Period Weeks   Status New     OT LONG TERM GOAL #3   Title Pt. will improve right hand coordination to be able to independnetly and efficiently turn pages in a magazine   Baseline Pt. has difficulty   Time 12   Period Weeks   Status New     OT LONG TERM GOAL #4   Title Pt. will improve right pinch lateral strength by 3# to tbe able to use a key.    Baseline Right; 15#, Left 23#   Time 12   Period Weeks   Status New     OT LONG TERM GOAL #5   Title  Pt. will improve 3 point pinch to be able to manage zippers.   Baseline right: 9#, Left: 16#    Time 12   Period Weeks   Status New     OT LONG TERM GOAL #6   Title Pt. will improve independently write a 3 sentance paragraph efficiently with 100% legibility.   Baseline 50% legibility   Time 12   Period Weeks   Status New               Plan - 07/19/16 1642    Clinical Impression Statement Pt. reports having discomfort in the dorsal aspect of his hand. Pt. reports he feels like he overdid it yesterday with the theraputty exercises at home. Pt. was advised to rest from the theraputty exercises until the next session, and when he resumes to decrease reps, and adjust the amount of putty needed. Pt. reports he likes to do as much as he can Pt. performs tasks very quickly. Pt. requires cues to slow down, and focus on the quality of movements, rather than quantity and speed of movements.  Pt. education was also provided about not over doing it with the types of tasks he is doing at home, and around the house. Pt. reports his right 2nd digit has been incoordinated for the past couple of months, making it difficult to use it during tasks including: using a key , as well as various other ADL tasks. Pt. continues to benefit from skilled OT services to improve ADL, and IADL functioning.   Rehab Potential Good   OT Frequency 2x / week   OT Duration 12 weeks   OT Treatment/Interventions Moist Heat;Patient/family education;Therapeutic exercises;Therapeutic activities;Energy conservation;Visual/perceptual remediation/compensation;Neuromuscular education;Passive range of motion;Therapeutic exercise;Self-care/ADL training   Consulted and Agree with Plan of Care Patient      Patient will benefit from skilled therapeutic intervention in order to improve the following deficits and impairments:  Decreased coordination, Decreased strength, Decreased mobility, Decreased balance, Impaired UE functional use,  Decreased activity tolerance  Visit Diagnosis: Muscle weakness (generalized)  Other lack of coordination    Problem List Patient Active Problem List   Diagnosis Date Noted  . Hyperlipidemia 12/28/2014  . Adiposity 11/02/2014  . Chronic inflammatory demyelinating polyneuritis ([place]) 09/10/2013  . Diabetes ([place]) 09/10/2013  . Essential hypertension 09/10/2013  . Obstructive apnea 09/10/2013  . Allergic rhinitis, seasonal 09/10/2013    Harrel Carina,  MS, OTR/L 07/19/2016, 5:07 PM  [place] MAIN [facility] SERVICES [address] [place], [place], [address] Phone: [phone]   Fax:  [phone]  Name: Phillip Fitzgerald MRN: [id number] Date of Birth: [birth year]

## 2016-07-24 ENCOUNTER — Ambulatory Visit: Payer: Medicare Other | Admitting: Occupational Therapy

## 2016-07-24 DIAGNOSIS — R278 Other lack of coordination: Secondary | ICD-10-CM | POA: Diagnosis not present

## 2016-07-24 DIAGNOSIS — M6281 Muscle weakness (generalized): Secondary | ICD-10-CM

## 2016-07-24 NOTE — Patient Instructions (Addendum)
OT TREATMENT     Neuro muscular re-education:  Pt. worked on the grasping, flipping, turning, and stacking pegs using the [employer]. Pt. worked on grasping coins from a tabletop surface, placing them into a resistive container, and pushing them through the slot while isolating his 2nd digit. A resistive mat was placed under coins to aide in manipulating the coins and prevent sliding when picking them up. Pt. worked on unstacking the coins one at a time alternating with the 2nd and 3rd digits. Pt. Worked on the vertical bolt tower manipulating, and screwing nuts, and bolts with UEs elevated. Pt. Worked on grasping 1/2" peg, and placing them in the palm of his hands, and working them to the tip of his 2nd digit, and thumb.  Therapeutic Exercise:   Selfcare:  Manual Therapy:

## 2016-07-24 NOTE — Therapy (Signed)
[place] MAIN [facility] SERVICES [address] [place], [place], [address] Phone: [phone]   Fax:  [phone]  Occupational Therapy Treatment  Patient Details  Name: Phillip Fitzgerald MRN: [id number] Date of Birth: [birth year] Referring Provider: Dr. Manuella Ghazi  Encounter Date: 07/24/2016      OT End of Session - 07/24/16 1330    Visit Number 5   Number of Visits 24   Date for OT Re-Evaluation 09/25/16   Authorization Type Medicare G code 5   OT Start Time 1300   OT Stop Time 1345   OT Time Calculation (min) 45 min   Activity Tolerance Patient tolerated treatment well   Behavior During Therapy [facility] for tasks assessed/performed      Past Medical History:  Diagnosis Date  . CIDP (chronic inflammatory demyelinating polyneuropathy) ([place])   . CIDP (chronic inflammatory demyelinating polyneuropathy) ([place])   . Diabetes mellitus without complication ([place])   . Elevated lipids   . Hypertension   . Sleep apnea     Past Surgical History:  Procedure Laterality Date  . APPENDECTOMY    . COLONOSCOPY  2011   cleared for 5 yrs- Dr Allen Norris  . KNEE SURGERY Right    scope  . PORTACATH PLACEMENT Right 09/16/2015   Procedure: INSERTION PORT-A-CATH;  Surgeon: Leonie Green, MD;  Location: [facility] ORS;  Service: General;  Laterality: Right;  . TONSILLECTOMY      There were no vitals filed for this visit.      Subjective Assessment - 07/24/16 1321    Subjective  Pt. reports his hand is much better. Pt. reports working with seeds at home.   Pertinent History Pt. is a 66 y.o. who was initially diagnosed with CIDP in February of 2013. Pt. has recently experienced weakness in his right hand functioning limiting his ability to complete ADL, and IADL tasks.    Currently in Pain? No/denies                      OT Treatments/Exercises (OP) - 07/24/16 0001      Fine Motor Coordination   Other Fine Motor Exercises Pt. worked on the grasping,  flipping, turning, and stacking pegs using the [employer]. Pt. worked on grasping coins from a tabletop surface, placing them into a resistive container, and pushing them through the slot while isolating his 2nd digit. A resistive mat was placed under coins to aide in manipulating the coins and prevent sliding when picking them up. Pt. worked on unstacking the coins one at a time alternating with the 2nd and 3rd digits. Pt. Worked on the vertical bolt tower manipulating, and screwing nuts, and bolts with UEs elevated. Pt. Worked on grasping 1/2" peg, and placing them in the palm of his hands, and working them to the tip of his 2nd digit, and thumb.                     OT Long Term Goals - 07/03/16 1623      OT LONG TERM GOAL #1   Title Pt. will increase RUE strength by 2 mm grades to assist with ADL/IADLs   Baseline limited RUE strength   Time 12   Period Weeks   Status New     OT LONG TERM GOAL #2   Title Pt. will increase right grip strength by 10# to be able to hold and fill dog bowls.   Baseline Right grip 39#, Left Grip  66#   Time 12   Period Weeks   Status New     OT LONG TERM GOAL #3   Title Pt. will improve right hand coordination to be able to independnetly and efficiently turn pages in a magazine   Baseline Pt. has difficulty   Time 12   Period Weeks   Status New     OT LONG TERM GOAL #4   Title Pt. will improve right pinch lateral strength by 3# to tbe able to use a key.    Baseline Right; 15#, Left 23#   Time 12   Period Weeks   Status New     OT LONG TERM GOAL #5   Title Pt. will improve 3 point pinch to be able to manage zippers.   Baseline right: 9#, Left: 16#    Time 12   Period Weeks   Status New     OT LONG TERM GOAL #6   Title Pt. will improve independently write a 3 sentance paragraph efficiently with 100% legibility.   Baseline 50% legibility   Time 12   Period Weeks   Status New               Plan - 07/24/16 1333     Clinical Impression Statement Pt. reports his right hand is better today. Pt. reports working alot with it yesterday planting seeds. Pt. is improving with right hand [facility] coordination skills. Pt. continues to work on improving motor control, and coordination skills. Emphasis was placed on focusing on the quality of movements.   Rehab Potential Good   OT Frequency 2x / week   OT Duration 12 weeks   OT Treatment/Interventions Moist Heat;Patient/family education;Therapeutic exercises;Therapeutic activities;Energy conservation;Visual/perceptual remediation/compensation;Neuromuscular education;Passive range of motion;Therapeutic exercise;Self-care/ADL training   Consulted and Agree with Plan of Care Patient      Patient will benefit from skilled therapeutic intervention in order to improve the following deficits and impairments:  Decreased coordination, Decreased strength, Decreased mobility, Decreased balance, Impaired UE functional use, Decreased activity tolerance  Visit Diagnosis: Muscle weakness (generalized)  Other lack of coordination    Problem List Patient Active Problem List   Diagnosis Date Noted  . Hyperlipidemia 12/28/2014  . Adiposity 11/02/2014  . Chronic inflammatory demyelinating polyneuritis ([place]) 09/10/2013  . Diabetes ([place]) 09/10/2013  . Essential hypertension 09/10/2013  . Obstructive apnea 09/10/2013  . Allergic rhinitis, seasonal 09/10/2013    Harrel Carina, MS, OTR/L 07/24/2016, 4:17 PM  [place] MAIN [facility] SERVICES [address] [place], [place], [address] Phone: [phone]   Fax:  [phone]  Name: Phillip Fitzgerald MRN: [id number] Date of Birth: 07/29/1950

## 2016-07-25 DIAGNOSIS — E119 Type 2 diabetes mellitus without complications: Secondary | ICD-10-CM | POA: Diagnosis not present

## 2016-07-26 ENCOUNTER — Ambulatory Visit: Payer: Medicare Other | Admitting: Occupational Therapy

## 2016-07-26 ENCOUNTER — Encounter: Payer: Self-pay | Admitting: Occupational Therapy

## 2016-07-26 DIAGNOSIS — M6281 Muscle weakness (generalized): Secondary | ICD-10-CM

## 2016-07-26 DIAGNOSIS — R278 Other lack of coordination: Secondary | ICD-10-CM

## 2016-07-26 NOTE — Patient Instructions (Signed)
OT TREATMENT     Neuro muscular re-education:  Pt. Worked on grasping, and flipping small checkers. Pt. Worked on grasping small pegs using long nosed tweezers. Pt. Alternated between using 2nd , and 3rd digit with thumb. Pt. Worked on removing pegs with his 2nd digit, and thumb.  Therapeutic Exercise:  Reveiwed theraputty HEP and made adjustments for gross grip, lateral, 3pt., and 2pt., and removing coins from putty.   Selfcare:  Manual Therapy:

## 2016-07-26 NOTE — Therapy (Addendum)
[place] MAIN [facility] SERVICES [address] [place], [place], [address] Phone: [phone]   Fax:  [phone]  Occupational Therapy Treatment  Patient Details  Name: Phillip Fitzgerald MRN: [id number] Date of Birth: 1951/03/23 Referring Provider: Dr. Manuella Ghazi  Encounter Date: 07/26/2016      OT End of Session - 07/26/16 1335    Visit Number 6   Number of Visits 24   Date for OT Re-Evaluation 09/25/16   Authorization Type Medicare G code 6   OT Start Time 1300   OT Stop Time 1345   OT Time Calculation (min) 45 min   Activity Tolerance Patient tolerated treatment well   Behavior During Therapy [facility] for tasks assessed/performed      Past Medical History:  Diagnosis Date  . CIDP (chronic inflammatory demyelinating polyneuropathy) ([place])   . CIDP (chronic inflammatory demyelinating polyneuropathy) ([place])   . Diabetes mellitus without complication ([place])   . Elevated lipids   . Hypertension   . Sleep apnea     Past Surgical History:  Procedure Laterality Date  . APPENDECTOMY    . COLONOSCOPY  2011   cleared for 5 yrs- Dr Allen Norris  . KNEE SURGERY Right    scope  . PORTACATH PLACEMENT Right 09/16/2015   Procedure: INSERTION PORT-A-CATH;  Surgeon: Leonie Green, MD;  Location: [facility] ORS;  Service: General;  Laterality: Right;  . TONSILLECTOMY      There were no vitals filed for this visit.      Subjective Assessment - 07/26/16 1325    Subjective  Pt. reports he almost didn't come due to his ramp icing.   Pertinent History Pt. is a 66 y.o. who was initially diagnosed with CIDP in February of 2013. Pt. has recently experienced weakness in his right hand functioning limiting his ability to complete ADL, and IADL tasks.    Patient Stated Goals To be able to use his right hand, and index finger again.   Currently in Pain? No/denies                      OT Treatments/Exercises (OP) - 07/26/16 1723      Fine Motor  Coordination   Other Fine Motor Exercises Pt. Worked on grasping, and flipping small checkers. Pt. Worked on grasping small pegs using long nosed tweezers. Pt. Alternated between using 2nd , and 3rd digit with thumb. Pt. Worked on removing pegs with his 2nd digit, and thumb. Pt. worked on grasping, and manipulating nuts, and bolts.     Neurological Re-education Exercises   Other Exercises 1 Reveiwed theraputty HEP and made adjustments for gross grip, lateral, 3pt., and 2pt., and removing coins from putty.                      OT Long Term Goals - 07/03/16 1623      OT LONG TERM GOAL #1   Title Pt. will increase RUE strength by 2 mm grades to assist with ADL/IADLs   Baseline limited RUE strength   Time 12   Period Weeks   Status New     OT LONG TERM GOAL #2   Title Pt. will increase right grip strength by 10# to be able to hold and fill dog bowls.   Baseline Right grip 39#, Left Grip 66#   Time 12   Period Weeks   Status New     OT LONG TERM GOAL #3   Title Pt.  will improve right hand coordination to be able to independnetly and efficiently turn pages in a magazine   Baseline Pt. has difficulty   Time 12   Period Weeks   Status New     OT LONG TERM GOAL #4   Title Pt. will improve right pinch lateral strength by 3# to tbe able to use a key.    Baseline Right; 15#, Left 23#   Time 12   Period Weeks   Status New     OT LONG TERM GOAL #5   Title Pt. will improve 3 point pinch to be able to manage zippers.   Baseline right: 9#, Left: 16#    Time 12   Period Weeks   Status New     OT LONG TERM GOAL #6   Title Pt. will improve independently write a 3 sentance paragraph efficiently with 100% legibility.   Baseline 50% legibility   Time 12   Period Weeks   Status New               Plan - 07/26/16 1336    Clinical Impression Statement Pt. reports his hand continues to feel better. Pt. reports his right 3rd digit feels incoordinated, more so than the  2nd digit. Pt. continues to work on improving right hand  fine motor coordination skills, translatory movements of the hand, manipulation, and dexterity skills.     Rehab Potential Good   OT Frequency 2x / week   OT Duration 12 weeks   OT Treatment/Interventions Moist Heat;Patient/family education;Therapeutic exercises;Therapeutic activities;Energy conservation;Visual/perceptual remediation/compensation;Neuromuscular education;Passive range of motion;Therapeutic exercise;Self-care/ADL training   Consulted and Agree with Plan of Care Patient      Patient will benefit from skilled therapeutic intervention in order to improve the following deficits and impairments:  Decreased coordination, Decreased strength, Decreased mobility, Decreased balance, Impaired UE functional use, Decreased activity tolerance  Visit Diagnosis: Muscle weakness (generalized)  Other lack of coordination    Problem List Patient Active Problem List   Diagnosis Date Noted  . Hyperlipidemia 12/28/2014  . Adiposity 11/02/2014  . Chronic inflammatory demyelinating polyneuritis ([place]) 09/10/2013  . Diabetes ([place]) 09/10/2013  . Essential hypertension 09/10/2013  . Obstructive apnea 09/10/2013  . Allergic rhinitis, seasonal 09/10/2013    Harrel Carina, MS, OTR/L 07/26/2016, 5:26 PM  [place] MAIN [facility] SERVICES [address] [place], [place], [address] Phone: [phone]   Fax:  [phone]  Name: CHUKWUEBUKA CHURCHILL MRN: [id number] Date of Birth: Nov 21, 1950

## 2016-07-27 ENCOUNTER — Other Ambulatory Visit: Payer: Self-pay

## 2016-07-31 ENCOUNTER — Ambulatory Visit: Payer: Medicare Other | Admitting: Occupational Therapy

## 2016-07-31 DIAGNOSIS — M6281 Muscle weakness (generalized): Secondary | ICD-10-CM

## 2016-07-31 DIAGNOSIS — R278 Other lack of coordination: Secondary | ICD-10-CM | POA: Diagnosis not present

## 2016-07-31 NOTE — Therapy (Signed)
[place] MAIN [facility] SERVICES [address] [place], [place], [address] Phone: [phone]   Fax:  [phone]  Occupational Therapy Treatment  Patient Details  Name: Phillip Fitzgerald MRN: [id number] Date of Birth: Dec 16, 1950 Referring Provider: Dr. Manuella Ghazi  Encounter Date: 07/31/2016      OT End of Session - 07/31/16 1712    Visit Number 7   Number of Visits 24   Date for OT Re-Evaluation 09/25/16   Authorization Type Medicare G code 7   OT Start Time 2482   OT Stop Time 1600   OT Time Calculation (min) 27 min   Activity Tolerance Patient tolerated treatment well   Behavior During Therapy [facility] for tasks assessed/performed      Past Medical History:  Diagnosis Date  . CIDP (chronic inflammatory demyelinating polyneuropathy) ([place])   . CIDP (chronic inflammatory demyelinating polyneuropathy) ([place])   . Diabetes mellitus without complication ([place])   . Elevated lipids   . Hypertension   . Sleep apnea     Past Surgical History:  Procedure Laterality Date  . APPENDECTOMY    . COLONOSCOPY  2011   cleared for 5 yrs- Dr Allen Norris  . KNEE SURGERY Right    scope  . PORTACATH PLACEMENT Right 09/16/2015   Procedure: INSERTION PORT-A-CATH;  Surgeon: Leonie Green, MD;  Location: [facility] ORS;  Service: General;  Laterality: Right;  . TONSILLECTOMY      There were no vitals filed for this visit.      Subjective Assessment - 07/31/16 1709    Subjective  Pt. reports his hand is feeling good, no pain, however his right 2nd & 3rd digit still feel incoordinated.    Pertinent History Pt. is a 66 y.o. who was initially diagnosed with CIDP in February of 2013. Pt. has recently experienced weakness in his right hand functioning limiting his ability to complete ADL, and IADL tasks.    Patient Stated Goals To be able to use his right hand, and index finger again.   Currently in Pain? No/denies                      OT Treatments/Exercises  (OP) - 07/31/16 1726      Fine Motor Coordination   Other Fine Motor Exercises Pt. worked on grasping 1" resistive cubes with alternating with his 2nd digit & thumb, and pressing them into place with isolating his 2nd, and 3rd digits. Pt. worked on tasks to sustain lateral pinch on resistive tweezers while grasping and moving 2" toothpick sticks from a horizontal flat position to a vertical position in order to place it in the holder. Pt. was able to sustain grasp while positioning and extending the wrist/hand in the necessary alignment needed to place the stick through the top of the holder. Pt. Worked on grasping mini clips with his 2nd digit, and thumb.     Neurological Re-education Exercises   Other Exercises 1 Pt. Worked on the [employer] for 4 min. With constant monitoring of the BUEs. Pt. Worked on changing, and alternating forward reverse position every 2 min. Rest breaks were required.                 OT Education - 07/31/16 1711    Education provided Yes   Education Details Right hand [facility] skills.   Person(s) Educated Patient   Comprehension Verbalized understanding;Returned demonstration;Verbal cues required             OT Long  Term Goals - 07/03/16 1623      OT LONG TERM GOAL #1   Title Pt. will increase RUE strength by 2 mm grades to assist with ADL/IADLs   Baseline limited RUE strength   Time 12   Period Weeks   Status New     OT LONG TERM GOAL #2   Title Pt. will increase right grip strength by 10# to be able to hold and fill dog bowls.   Baseline Right grip 39#, Left Grip 66#   Time 12   Period Weeks   Status New     OT LONG TERM GOAL #3   Title Pt. will improve right hand coordination to be able to independnetly and efficiently turn pages in a magazine   Baseline Pt. has difficulty   Time 12   Period Weeks   Status New     OT LONG TERM GOAL #4   Title Pt. will improve right pinch lateral strength by 3# to tbe able to use a key.    Baseline Right;  15#, Left 23#   Time 12   Period Weeks   Status New     OT LONG TERM GOAL #5   Title Pt. will improve 3 point pinch to be able to manage zippers.   Baseline right: 9#, Left: 16#    Time 12   Period Weeks   Status New     OT LONG TERM GOAL #6   Title Pt. will improve independently write a 3 sentance paragraph efficiently with 100% legibility.   Baseline 50% legibility   Time 12   Period Weeks   Status New               Plan - 07/31/16 1713    Clinical Impression Statement Pt. was late for session secondary to traffic delays in construction zone through [employer]. Pt. reports no pain, and has been using the theraputty at home without difficulty. Pt. reports hiding washers in the putty, and trying to work them out with his fingers at home.  Pt. worked on improving right hand [facility] skills. Pt. requires visual demonstration, verbal, and tactile cues for proper technique, and movement patterns.   OT Frequency 2x / week   OT Duration 12 weeks   OT Treatment/Interventions Moist Heat;Patient/family education;Therapeutic exercises;Therapeutic activities;Energy conservation;Visual/perceptual remediation/compensation;Neuromuscular education;Passive range of motion;Therapeutic exercise;Self-care/ADL training   Consulted and Agree with Plan of Care Patient      Patient will benefit from skilled therapeutic intervention in order to improve the following deficits and impairments:  Decreased coordination, Decreased strength, Decreased mobility, Decreased balance, Impaired UE functional use, Decreased activity tolerance  Visit Diagnosis: Muscle weakness (generalized)  Other lack of coordination    Problem List Patient Active Problem List   Diagnosis Date Noted  . Hyperlipidemia 12/28/2014  . Adiposity 11/02/2014  . Chronic inflammatory demyelinating polyneuritis ([place]) 09/10/2013  . Diabetes ([place]) 09/10/2013  . Essential hypertension 09/10/2013  . Obstructive apnea 09/10/2013  .  Allergic rhinitis, seasonal 09/10/2013    Harrel Carina, MS, OTR/L 07/31/2016, 5:28 PM  [place] MAIN [facility] SERVICES [address] [place], [place], [address] Phone: [phone]   Fax:  [phone]  Name: Phillip Fitzgerald MRN: [id number] Date of Birth: 03-Nov-1950

## 2016-07-31 NOTE — Patient Instructions (Signed)
OT TREATMENT     Neuro muscular re-education:  Pt. worked on grasping 1" resistive cubes with alternating with his 2nd digit & thumb, and pressing them into place with isolating his 2nd, and 3rd digits. Pt. worked on tasks to sustain lateral pinch on resistive tweezers while grasping and moving 2" toothpick sticks from a horizontal flat position to a vertical position in order to place it in the holder. Pt. was able to sustain grasp while positioning and extending the wrist/hand in the necessary alignment needed to place the stick through the top of the holder. Pt. Worked on grasping mini clips with his 2nd digit, and thumb.  Therapeutic Exercise:  Pt. Worked on the [employer] for 4 min. With constant monitoring of the BUEs. Pt. Worked on changing, and alternating forward reverse position every 2 min. Rest breaks were required.   Selfcare:  Manual Therapy:

## 2016-08-02 ENCOUNTER — Encounter: Payer: Self-pay | Admitting: Occupational Therapy

## 2016-08-02 ENCOUNTER — Ambulatory Visit: Payer: Medicare Other | Admitting: Occupational Therapy

## 2016-08-02 DIAGNOSIS — R278 Other lack of coordination: Secondary | ICD-10-CM

## 2016-08-02 DIAGNOSIS — M6281 Muscle weakness (generalized): Secondary | ICD-10-CM

## 2016-08-02 NOTE — Patient Instructions (Signed)
OT TREATMENT     Neuro muscular re-education:  Pt. Worked on grasping , and removing 1" resistive cubes  From a board set at a vertical angle. Pt. Worked with alternating thumb opposition to the tip of the 2nd through 5th digits. Pt. Worked on grasping 2" sticks, and placing them in a pegboard. Pt. Worked on removing the pegs with resistive tweezers. Pt. Was able to maintain a lateral grasp, while extending his wrist when removing the sticks against resistance. Pt. worked on grasping 1/16" extra small beads, and manipulating them with his right hand.    Therapeutic Exercise:  Selfcare:  Manual Therapy:

## 2016-08-02 NOTE — Therapy (Signed)
[place] MAIN [facility] SERVICES [address] [place], [place], [address] Phone: [phone]   Fax:  [phone]  Occupational Therapy Treatment  Patient Details  Name: Phillip Fitzgerald MRN: [id number] Date of Birth: 01-26-1951 Referring Provider: Dr. Manuella Ghazi  Encounter Date: 08/02/2016      OT End of Session - 08/02/16 2124    Visit Number 8   Number of Visits 24   Date for OT Re-Evaluation 09/25/16   Authorization Type Medicare G code 8   OT Start Time 1300   OT Stop Time 1345   OT Time Calculation (min) 45 min   Activity Tolerance Patient tolerated treatment well   Behavior During Therapy [facility] for tasks assessed/performed      Past Medical History:  Diagnosis Date  . CIDP (chronic inflammatory demyelinating polyneuropathy) ([place])   . CIDP (chronic inflammatory demyelinating polyneuropathy) ([place])   . Diabetes mellitus without complication ([place])   . Elevated lipids   . Hypertension   . Sleep apnea     Past Surgical History:  Procedure Laterality Date  . APPENDECTOMY    . COLONOSCOPY  2011   cleared for 5 yrs- Dr Allen Norris  . KNEE SURGERY Right    scope  . PORTACATH PLACEMENT Right 09/16/2015   Procedure: INSERTION PORT-A-CATH;  Surgeon: Leonie Green, MD;  Location: [facility] ORS;  Service: General;  Laterality: Right;  . TONSILLECTOMY      There were no vitals filed for this visit.      Subjective Assessment - 08/02/16 1324    Subjective  Pt, reports he is having some renovations done to his kitchen.   Pertinent History Pt. is a 66 y.o. who was initially diagnosed with CIDP in February of 2013. Pt. has recently experienced weakness in his right hand functioning limiting his ability to complete ADL, and IADL tasks.    Patient Stated Goals To be able to use his right hand, and index finger again.   Currently in Pain? No/denies            [facility] OT Assessment - 08/02/16 1330      Coordination   Right 9 Hole Peg Test (P)  24      Hand Function   Right Hand Grip (lbs) (P)  55   Right Hand Lateral Pinch (P)  16 lbs   Right Hand 3 Point Pinch (P)  9 lbs                  OT Treatments/Exercises (OP) - 08/02/16 0001      Fine Motor Coordination   Other Fine Motor Exercises Pt. Worked on grasping , and removing 1" resistive cubes  From a board set at a vertical angle. Pt. Worked with alternating thumb opposition to the tip of the 2nd through 5th digits. Pt. Worked on grasping 2" sticks, and placing them in a pegboard. Pt. Worked on removing the pegs with resistive tweezers. Pt. Was able to maintain a lateral grasp, while extending his wrist when removing the sticks against resistance. Pt. worked on grasping 1/16" extra small beads, and manipulating them with his right hand.                  OT Education - 08/02/16 2123    Education provided Yes   Education Details Right hand [facility] skills    Person(s) Educated Patient   Comprehension Verbalized understanding;Returned demonstration;Verbal cues required  OT Long Term Goals - 07/03/16 1623      OT LONG TERM GOAL #1   Title Pt. will increase RUE strength by 2 mm grades to assist with ADL/IADLs   Baseline limited RUE strength   Time 12   Period Weeks   Status New     OT LONG TERM GOAL #2   Title Pt. will increase right grip strength by 10# to be able to hold and fill dog bowls.   Baseline Right grip 39#, Left Grip 66#   Time 12   Period Weeks   Status New     OT LONG TERM GOAL #3   Title Pt. will improve right hand coordination to be able to independnetly and efficiently turn pages in a magazine   Baseline Pt. has difficulty   Time 12   Period Weeks   Status New     OT LONG TERM GOAL #4   Title Pt. will improve right pinch lateral strength by 3# to tbe able to use a key.    Baseline Right; 15#, Left 23#   Time 12   Period Weeks   Status New     OT LONG TERM GOAL #5   Title Pt. will improve 3 point pinch to be able  to manage zippers.   Baseline right: 9#, Left: 16#    Time 12   Period Weeks   Status New     OT LONG TERM GOAL #6   Title Pt. will improve independently write a 3 sentance paragraph efficiently with 100% legibility.   Baseline 50% legibility   Time 12   Period Weeks   Status New               Plan - 08/02/16 2124    Clinical Impression Statement Pt. is making progress with right hand strength, and coordination skills. Pt. conitnues to work on refining right hand movements for manipulation, tanslatory movements of the hand, and fine motor coordiination skills for grasping small objects during ADLS, and IADLs.   Rehab Potential Good   OT Frequency 2x / week   OT Duration 12 weeks   OT Treatment/Interventions Moist Heat;Patient/family education;Therapeutic exercises;Therapeutic activities;Energy conservation;Visual/perceptual remediation/compensation;Neuromuscular education;Passive range of motion;Therapeutic exercise;Self-care/ADL training   Consulted and Agree with Plan of Care Patient      Patient will benefit from skilled therapeutic intervention in order to improve the following deficits and impairments:  Decreased coordination, Decreased strength, Decreased mobility, Decreased balance, Impaired UE functional use, Decreased activity tolerance  Visit Diagnosis: Muscle weakness (generalized)  Other lack of coordination    Problem List Patient Active Problem List   Diagnosis Date Noted  . Hyperlipidemia 12/28/2014  . Adiposity 11/02/2014  . Chronic inflammatory demyelinating polyneuritis ([place]) 09/10/2013  . Diabetes ([place]) 09/10/2013  . Essential hypertension 09/10/2013  . Obstructive apnea 09/10/2013  . Allergic rhinitis, seasonal 09/10/2013    Harrel Carina, MS, OTR/L 08/02/2016, 9:39 PM  [place] MAIN [facility] SERVICES [address] [place], [place], [address] Phone: [phone]   Fax:  [phone]  Name: Phillip Fitzgerald MRN: [id number] Date of Birth: Dec 04, 1950

## 2016-08-04 ENCOUNTER — Inpatient Hospital Stay: Payer: Medicare Other | Attending: Oncology

## 2016-08-04 DIAGNOSIS — Z452 Encounter for adjustment and management of vascular access device: Secondary | ICD-10-CM | POA: Diagnosis not present

## 2016-08-04 DIAGNOSIS — Z95828 Presence of other vascular implants and grafts: Secondary | ICD-10-CM

## 2016-08-04 DIAGNOSIS — G6181 Chronic inflammatory demyelinating polyneuritis: Secondary | ICD-10-CM | POA: Insufficient documentation

## 2016-08-04 MED ORDER — SODIUM CHLORIDE 0.9% FLUSH
10.0000 mL | INTRAVENOUS | Status: DC | PRN
  Administered 2016-08-04: 10 mL via INTRAVENOUS
  Filled 2016-08-04: qty 10

## 2016-08-04 MED ORDER — HEPARIN SOD (PORK) LOCK FLUSH 100 UNIT/ML IV SOLN
INTRAVENOUS | Status: AC
  Filled 2016-08-04: qty 5

## 2016-08-04 MED ORDER — HEPARIN SOD (PORK) LOCK FLUSH 100 UNIT/ML IV SOLN
500.0000 [IU] | Freq: Once | INTRAVENOUS | Status: AC
  Administered 2016-08-04: 500 [IU] via INTRAVENOUS

## 2016-08-07 ENCOUNTER — Ambulatory Visit: Payer: Medicare Other | Attending: Neurology | Admitting: Occupational Therapy

## 2016-08-07 DIAGNOSIS — R278 Other lack of coordination: Secondary | ICD-10-CM | POA: Diagnosis not present

## 2016-08-07 DIAGNOSIS — M6281 Muscle weakness (generalized): Secondary | ICD-10-CM | POA: Insufficient documentation

## 2016-08-08 ENCOUNTER — Encounter: Payer: Self-pay | Admitting: Occupational Therapy

## 2016-08-09 ENCOUNTER — Ambulatory Visit: Payer: Medicare Other | Admitting: Occupational Therapy

## 2016-08-09 DIAGNOSIS — R278 Other lack of coordination: Secondary | ICD-10-CM | POA: Diagnosis not present

## 2016-08-09 DIAGNOSIS — M6281 Muscle weakness (generalized): Secondary | ICD-10-CM | POA: Diagnosis not present

## 2016-08-11 ENCOUNTER — Other Ambulatory Visit: Payer: Self-pay | Admitting: Family Medicine

## 2016-08-11 DIAGNOSIS — I10 Essential (primary) hypertension: Secondary | ICD-10-CM

## 2016-08-13 ENCOUNTER — Encounter: Payer: Self-pay | Admitting: Occupational Therapy

## 2016-08-13 NOTE — Therapy (Signed)
[place] MAIN [facility] SERVICES [address] [place], [place], [address] Phone: [phone]   Fax:  [phone]  Occupational Therapy Treatment/Progress Note  Patient Details  Name: Phillip Fitzgerald MRN: [id number] Date of Birth: [birth year] Referring Provider: Dr. Manuella Ghazi  Encounter Date: 08/09/2016      OT End of Session - 08/13/16 1401    Visit Number 10   Number of Visits 24   Date for OT Re-Evaluation 09/25/16   Authorization Type Medicare G code 10   OT Start Time 1962   OT Stop Time 1346   OT Time Calculation (min) 43 min   Activity Tolerance Patient tolerated treatment well   Behavior During Therapy [facility] for tasks assessed/performed      Past Medical History:  Diagnosis Date  . CIDP (chronic inflammatory demyelinating polyneuropathy) ([place])   . CIDP (chronic inflammatory demyelinating polyneuropathy) ([place])   . Diabetes mellitus without complication ([place])   . Elevated lipids   . Hypertension   . Sleep apnea     Past Surgical History:  Procedure Laterality Date  . APPENDECTOMY    . COLONOSCOPY  2011   cleared for 5 yrs- Dr Allen Norris  . KNEE SURGERY Right    scope  . PORTACATH PLACEMENT Right 09/16/2015   Procedure: INSERTION PORT-A-CATH;  Surgeon: Leonie Green, MD;  Location: [facility] ORS;  Service: General;  Laterality: Right;  . TONSILLECTOMY      There were no vitals filed for this visit.      Subjective Assessment - 08/13/16 1400    Subjective  Patient talking about having their kitchen redone soon, he also brought in pictures of his motor home they hope to live in one day.    Pertinent History Pt. is a 66 y.o. who was initially diagnosed with CIDP in February of 2013. Pt. has recently experienced weakness in his right hand functioning limiting his ability to complete ADL, and IADL tasks.    Patient Stated Goals To be able to use his right hand, and index finger again.   Currently in Pain? No/denies   Multiple Pain Sites  No                      OT Treatments/Exercises (OP) - 08/13/16 1528      Fine Motor Coordination   Other Fine Motor Exercises Patient seen for resistive velcro board for wrist and finger strengthening with cues for technique.  Manipulation of small objects, marbles (round, slick surface area) picking up and using translatory movements to the palm and then using the hand for storage.  Manipulation of toothpicks from tabletop to place into specified container using tweezers with cues.  Manipulation of nuts and bolts with emphasis on thumb finger combinations to place and remove, knottting and unknotting exercises for bilateral coordination.                 OT Education - 08/13/16 1401    Education provided Yes   Education Details [place], HEP   Person(s) Educated Patient   Methods Demonstration;Explanation   Comprehension Verbalized understanding;Returned demonstration;Verbal cues required             OT Long Term Goals - 08/13/16 1402      OT LONG TERM GOAL #1   Title Pt. will increase RUE strength by 2 mm grades to assist with ADL/IADLs   Baseline limited RUE strength   Time 12   Period Weeks   Status On-going  OT LONG TERM GOAL #2   Title Pt. will increase right grip strength by 10# to be able to hold and fill dog bowls.   Baseline Right grip 39#, Left Grip 66#   Time 12   Period Weeks   Status On-going     OT LONG TERM GOAL #3   Title Pt. will improve right hand coordination to be able to independnetly and efficiently turn pages in a magazine   Baseline Pt. has difficulty   Time 12   Period Weeks   Status On-going     OT LONG TERM GOAL #4   Title Pt. will improve right pinch lateral strength by 3# to tbe able to use a key.    Baseline Right; 15#, Left 23#   Time 12   Period Weeks   Status On-going     OT LONG TERM GOAL #5   Title Pt. will improve 3 point pinch to be able to manage zippers.   Baseline right: 9#, Left: 16#    Time 12    Period Weeks   Status On-going     OT LONG TERM GOAL #6   Title Pt. will improve independently write a 3 sentance paragraph efficiently with 100% legibility.   Baseline 50% legibility   Time 12   Period Weeks   Status On-going               Plan - 08/13/16 1402    Clinical Impression Statement Patient continues to work towards improving fine motor coordination skills to manipulate items, manage tools and improve performance in necessary daily activities at home.  He continues to benefit from skilled OT to maximize safety and independence in daily tasks.  Continue to focus on speed and dexterity of RUE.   Rehab Potential Good   OT Frequency 2x / week   OT Duration 12 weeks   OT Treatment/Interventions Moist Heat;Patient/family education;Therapeutic exercises;Therapeutic activities;Energy conservation;Visual/perceptual remediation/compensation;Neuromuscular education;Passive range of motion;Therapeutic exercise;Self-care/ADL training   Consulted and Agree with Plan of Care Patient      Patient will benefit from skilled therapeutic intervention in order to improve the following deficits and impairments:  Decreased coordination, Decreased strength, Decreased mobility, Decreased balance, Impaired UE functional use, Decreased activity tolerance  Visit Diagnosis: Muscle weakness (generalized)  Other lack of coordination    Problem List Patient Active Problem List   Diagnosis Date Noted  . Hyperlipidemia 12/28/2014  . Adiposity 11/02/2014  . Chronic inflammatory demyelinating polyneuritis ([place]) 09/10/2013  . Diabetes ([place]) 09/10/2013  . Essential hypertension 09/10/2013  . Obstructive apnea 09/10/2013  . Allergic rhinitis, seasonal 09/10/2013   Dailynn Nancarrow T Tomasita Morrow, OTR/L, CLT  Lareina Espino 08/13/2016, 3:33 PM  [place] MAIN [facility] SERVICES [address] [place], [place], [address] Phone: [phone]   Fax:  [phone]  Name: PLATON AROCHO MRN: [id number] Date of Birth: 1950/07/14

## 2016-08-13 NOTE — Therapy (Signed)
[place] MAIN [facility] SERVICES [address] [place], [place], [address] Phone: [phone]   Fax:  [phone]  Occupational Therapy Treatment  Patient Details  Name: Phillip Fitzgerald MRN: [id number] Date of Birth: November 09, 1950 Referring Provider: Dr. Manuella Ghazi  Encounter Date: 08/07/2016      OT End of Session - 08/13/16 1154    Visit Number 9   Number of Visits 24   Date for OT Re-Evaluation 09/25/16   Authorization Type Medicare G code 9   OT Start Time 1315   OT Stop Time 1402   OT Time Calculation (min) 47 min      Past Medical History:  Diagnosis Date  . CIDP (chronic inflammatory demyelinating polyneuropathy) ([place])   . CIDP (chronic inflammatory demyelinating polyneuropathy) ([place])   . Diabetes mellitus without complication ([place])   . Elevated lipids   . Hypertension   . Sleep apnea     Past Surgical History:  Procedure Laterality Date  . APPENDECTOMY    . COLONOSCOPY  2011   cleared for 5 yrs- Dr Allen Norris  . KNEE SURGERY Right    scope  . PORTACATH PLACEMENT Right 09/16/2015   Procedure: INSERTION PORT-A-CATH;  Surgeon: Leonie Green, MD;  Location: [facility] ORS;  Service: General;  Laterality: Right;  . TONSILLECTOMY      There were no vitals filed for this visit.      Subjective Assessment - 08/13/16 1141    Subjective  Patient reports he got his appointments mixed up and thought he was supposed to have OT today, OT was able to work patient in at the time he thought he was scheduled for.    Pertinent History Pt. is a 66 y.o. who was initially diagnosed with CIDP in February of 2013. Pt. has recently experienced weakness in his right hand functioning limiting his ability to complete ADL, and IADL tasks.    Patient Stated Goals To be able to use his right hand, and index finger again.   Currently in Pain? No/denies   Multiple Pain Sites No                      OT Treatments/Exercises (OP) - 08/13/16 1144       Fine Motor Coordination   Other Fine Motor Exercises Patient seen for RUE coordination exercises with use of [place] with turning and flipping one time to opposite side with cues for form and technique then progressed to 2 flips with emphasis on speed and dexterity.  Pt. demonstrated grasping 1 inch sticks,  inch cylindrical collars, and  inch flat washers on the Purdue pegboard, use of magnetic bowl to increase resistance for attempts and manipulation and managing items.  Manipulation of toothpicks from flat tabletop using thumb and index finger in classic oppositional movement pattern then working to turn item to place into container designed with small holes for toothpicks.  Cues for isolated finger movements and prehension patterns.      Neurological Re-education Exercises   Other Exercises 1 Grip strengthening with right UE 11# for 25 reps for sustained gripping with cues for hand placement and technique on hand gripper, dropped 5 of 25, cues to rest and stretch hand at times when he demonstrates signs of fatigued.                 OT Education - 08/13/16 1153    Education provided Yes   Education Details fine motor coordination and manipulation skills  Person(s) Educated Patient   Methods Explanation;Demonstration;Verbal cues   Comprehension Verbalized understanding;Returned demonstration;Verbal cues required             OT Long Term Goals - 08/13/16 1157      OT LONG TERM GOAL #1   Title Pt. will increase RUE strength by 2 mm grades to assist with ADL/IADLs   Baseline limited RUE strength   Time 12   Period Weeks   Status On-going     OT LONG TERM GOAL #2   Title Pt. will increase right grip strength by 10# to be able to hold and fill dog bowls.   Baseline Right grip 39#, Left Grip 66#   Time 12   Period Weeks   Status On-going     OT LONG TERM GOAL #3   Title Pt. will improve right hand coordination to be able to independnetly and efficiently turn  pages in a magazine   Baseline Pt. has difficulty   Time 12   Period Weeks   Status On-going     OT LONG TERM GOAL #4   Title Pt. will improve right pinch lateral strength by 3# to tbe able to use a key.    Baseline Right; 15#, Left 23#   Time 12   Period Weeks   Status On-going     OT LONG TERM GOAL #5   Title Pt. will improve 3 point pinch to be able to manage zippers.   Baseline right: 9#, Left: 16#    Time 12   Period Weeks   Status On-going     OT LONG TERM GOAL #6   Title Pt. will improve independently write a 3 sentance paragraph efficiently with 100% legibility.   Baseline 50% legibility   Time 12   Period Weeks   Status On-going               Plan - 08/13/16 1154    Clinical Impression Statement Patient continues to demostrate impairments in right hand strength and coordination and continues to benefit from skilled OT services to focus on these areas to improve performance in daily tasks.  He requires cues for prehension patterns, drops items occasionally and benefit from short rest breaks during functional hand exercises.  He has been working on exercises at home and attempting to help with tasks around the house such as helping to prepare a leg of lamb this past weekend.     Rehab Potential Good   OT Frequency 2x / week   OT Duration 12 weeks   OT Treatment/Interventions Moist Heat;Patient/family education;Therapeutic exercises;Therapeutic activities;Energy conservation;Visual/perceptual remediation/compensation;Neuromuscular education;Passive range of motion;Therapeutic exercise;Self-care/ADL training   Consulted and Agree with Plan of Care Patient      Patient will benefit from skilled therapeutic intervention in order to improve the following deficits and impairments:  Decreased coordination, Decreased strength, Decreased mobility, Decreased balance, Impaired UE functional use, Decreased activity tolerance  Visit Diagnosis: Muscle weakness  (generalized)  Other lack of coordination    Problem List Patient Active Problem List   Diagnosis Date Noted  . Hyperlipidemia 12/28/2014  . Adiposity 11/02/2014  . Chronic inflammatory demyelinating polyneuritis ([place]) 09/10/2013  . Diabetes ([place]) 09/10/2013  . Essential hypertension 09/10/2013  . Obstructive apnea 09/10/2013  . Allergic rhinitis, seasonal 09/10/2013   Voncille Simm T Tomasita Morrow, OTR/L, CLT  Kenroy Timberman 08/13/2016, 11:58 AM  [place] MAIN [facility] SERVICES [address] [place], [place], [address] Phone: [phone]   Fax:  [phone]  Name: NAVEN GIAMBALVO MRN: [id number] Date of Birth: [birth year]

## 2016-08-14 ENCOUNTER — Ambulatory Visit: Payer: Medicare Other | Attending: Neurology | Admitting: Occupational Therapy

## 2016-08-14 DIAGNOSIS — R278 Other lack of coordination: Secondary | ICD-10-CM | POA: Insufficient documentation

## 2016-08-14 DIAGNOSIS — M6281 Muscle weakness (generalized): Secondary | ICD-10-CM | POA: Diagnosis not present

## 2016-08-14 NOTE — Patient Instructions (Signed)
OT TREATMENT     Neuro muscular re-education:  Pt. performed [facility] skills training to improve speed and dexterity needed for ADL tasks and writing. Pt. demonstrated grasping 1 inch sticks,  inch cylindrical collars, and  inch flat washers on the Purdue pegboard. Pt. performed grasping each item with her 2nd digit and thumb, and storing them in the palm. Pt. presented with difficulty storing  inch objects at a time in the palmar aspect of the hand. Pt. worked on unknotting multiple double knots. Pt. worked on tasks to sustain lateral pinch on resistive tweezers while grasping and moving 2" toothpick sticks from a horizontal flat position to a vertical position in order to place it in the holder. Pt. was able to sustain grasp while positioning and extending the wrist/hand in the necessary alignment needed to place the stick through the top of the holder.  Therapeutic Exercise:  Selfcare:  Manual Therapy:

## 2016-08-14 NOTE — Therapy (Signed)
[place] MAIN [facility] SERVICES [address] [place], [place], [address] Phone: [phone]   Fax:  [phone]  Occupational Therapy Treatment  Patient Details  Name: Phillip Fitzgerald MRN: [id number] Date of Birth: Mar 04, 1951 Referring Provider: Dr. Manuella Ghazi  Encounter Date: 08/14/2016      OT End of Session - 08/14/16 1310    Visit Number 11   Number of Visits 24   Date for OT Re-Evaluation 09/25/16   Authorization Type Medicare G code 11   OT Start Time 1300   OT Stop Time 1345   OT Time Calculation (min) 45 min   Activity Tolerance Patient tolerated treatment well   Behavior During Therapy [facility] for tasks assessed/performed      Past Medical History:  Diagnosis Date  . CIDP (chronic inflammatory demyelinating polyneuropathy) ([place])   . CIDP (chronic inflammatory demyelinating polyneuropathy) ([place])   . Diabetes mellitus without complication ([place])   . Elevated lipids   . Hypertension   . Sleep apnea     Past Surgical History:  Procedure Laterality Date  . APPENDECTOMY    . COLONOSCOPY  2011   cleared for 5 yrs- Dr Allen Norris  . KNEE SURGERY Right    scope  . PORTACATH PLACEMENT Right 09/16/2015   Procedure: INSERTION PORT-A-CATH;  Surgeon: Leonie Green, MD;  Location: [facility] ORS;  Service: General;  Laterality: Right;  . TONSILLECTOMY      There were no vitals filed for this visit.      Subjective Assessment - 08/14/16 1306    Subjective  Pt. reports he is sore from doing the kitchen demolition over ths past weekend.   Pertinent History Pt. is a 66 y.o. who was initially diagnosed with CIDP in February of 2013. Pt. has recently experienced weakness in his right hand functioning limiting his ability to complete ADL, and IADL tasks.    Patient Stated Goals To be able to use his right hand, and index finger again.   Currently in Pain? Yes   Pain Score 3    Pain Location --  Overall Pain                      OT  Treatments/Exercises (OP) - 08/14/16 1336      Fine Motor Coordination   Other Fine Motor Exercises Pt. performed [facility] skills training to improve speed and dexterity needed for ADL tasks and writing. Pt. demonstrated grasping 1 inch sticks,  inch cylindrical collars, and  inch flat washers on the Purdue pegboard. Pt. performed grasping each item with her 2nd digit and thumb, and storing them in the palm. Pt. presented with difficulty storing  inch objects at a time in the palmar aspect of the hand. Pt. worked on unknotting multiple double knots. Pt. worked on tasks to sustain lateral pinch on resistive tweezers while grasping and moving 2" toothpick sticks from a horizontal flat position to a vertical position in order to place it in the holder. Pt. was able to sustain grasp while positioning and extending the wrist/hand in the necessary alignment needed to place the stick through the top of the holder. Pt. worked on flipping cards alternating using a thumb on fingers, fingers on thumb pattern.                OT Education - 08/14/16 1310    Education provided Yes   Education Details [facility]   Person(s) Educated Patient   Methods Demonstration;Explanation   Comprehension  Verbalized understanding;Returned demonstration;Verbal cues required             OT Long Term Goals - 08/13/16 1402      OT LONG TERM GOAL #1   Title Pt. will increase RUE strength by 2 mm grades to assist with ADL/IADLs   Baseline limited RUE strength   Time 12   Period Weeks   Status On-going     OT LONG TERM GOAL #2   Title Pt. will increase right grip strength by 10# to be able to hold and fill dog bowls.   Baseline Right grip 39#, Left Grip 66#   Time 12   Period Weeks   Status On-going     OT LONG TERM GOAL #3   Title Pt. will improve right hand coordination to be able to independnetly and efficiently turn pages in a magazine   Baseline Pt. has difficulty   Time 12   Period Weeks   Status On-going      OT LONG TERM GOAL #4   Title Pt. will improve right pinch lateral strength by 3# to tbe able to use a key.    Baseline Right; 15#, Left 23#   Time 12   Period Weeks   Status On-going     OT LONG TERM GOAL #5   Title Pt. will improve 3 point pinch to be able to manage zippers.   Baseline right: 9#, Left: 16#    Time 12   Period Weeks   Status On-going     OT LONG TERM GOAL #6   Title Pt. will improve independently write a 3 sentance paragraph efficiently with 100% legibility.   Baseline 50% legibility   Time 12   Period Weeks   Status On-going               Plan - 08/14/16 1312    Clinical Impression Statement Pt. reports being sore from working to demolish his kitchen, and he is preparing to remodel the kitchen. Pt. continues to improve with coordination skills. Pt works on improving translatory movements of the hand, and moving objects within the hand.   Rehab Potential Good   OT Frequency 2x / week   OT Duration 12 weeks   OT Treatment/Interventions Moist Heat;Patient/family education;Therapeutic exercises;Therapeutic activities;Energy conservation;Visual/perceptual remediation/compensation;Neuromuscular education;Passive range of motion;Therapeutic exercise;Self-care/ADL training   OT Home Exercise Plan HEP for pink theraputty ex.   Consulted and Agree with Plan of Care Patient      Patient will benefit from skilled therapeutic intervention in order to improve the following deficits and impairments:  Decreased coordination, Decreased strength, Decreased mobility, Decreased balance, Impaired UE functional use, Decreased activity tolerance  Visit Diagnosis: Other lack of coordination    Problem List Patient Active Problem List   Diagnosis Date Noted  . Hyperlipidemia 12/28/2014  . Adiposity 11/02/2014  . Chronic inflammatory demyelinating polyneuritis ([place]) 09/10/2013  . Diabetes ([place]) 09/10/2013  . Essential hypertension 09/10/2013  . Obstructive apnea  09/10/2013  . Allergic rhinitis, seasonal 09/10/2013    Harrel Carina, MS, OTR/L 08/14/2016, 4:53 PM  [place] MAIN [facility] SERVICES [address] [place], [place], [address] Phone: [phone]   Fax:  [phone]  Name: Phillip Fitzgerald MRN: [id number] Date of Birth: 04/17/1951

## 2016-08-16 ENCOUNTER — Ambulatory Visit: Payer: Medicare Other | Admitting: Occupational Therapy

## 2016-08-21 ENCOUNTER — Ambulatory Visit: Payer: Medicare Other | Admitting: Occupational Therapy

## 2016-08-21 DIAGNOSIS — R278 Other lack of coordination: Secondary | ICD-10-CM | POA: Diagnosis not present

## 2016-08-21 DIAGNOSIS — M6281 Muscle weakness (generalized): Secondary | ICD-10-CM | POA: Diagnosis not present

## 2016-08-21 NOTE — Therapy (Signed)
[place] MAIN [facility] SERVICES [address] [place], [place], [address] Phone: [phone]   Fax:  [phone]  Occupational Therapy Treatment  Patient Details  Name: Phillip Fitzgerald MRN: [id number] Date of Birth: 11/04/1950 Referring Provider: Dr. Manuella Ghazi  Encounter Date: 08/21/2016      OT End of Session - 08/21/16 1330    Visit Number 12   Number of Visits 24   Date for OT Re-Evaluation 09/25/16   Authorization Type Medicare G code 12   OT Start Time 1300   OT Stop Time 1345   OT Time Calculation (min) 45 min   Activity Tolerance Patient tolerated treatment well   Behavior During Therapy [facility] for tasks assessed/performed      Past Medical History:  Diagnosis Date  . CIDP (chronic inflammatory demyelinating polyneuropathy) ([place])   . CIDP (chronic inflammatory demyelinating polyneuropathy) ([place])   . Diabetes mellitus without complication ([place])   . Elevated lipids   . Hypertension   . Sleep apnea     Past Surgical History:  Procedure Laterality Date  . APPENDECTOMY    . COLONOSCOPY  2011   cleared for 5 yrs- Dr Allen Norris  . KNEE SURGERY Right    scope  . PORTACATH PLACEMENT Right 09/16/2015   Procedure: INSERTION PORT-A-CATH;  Surgeon: Leonie Green, MD;  Location: [facility] ORS;  Service: General;  Laterality: Right;  . TONSILLECTOMY      There were no vitals filed for this visit.      Subjective Assessment - 08/21/16 1325    Subjective  Pt. reports he has been having some plumbing work done.    Pertinent History Pt. is a 66 y.o. who was initially diagnosed with CIDP in February of 2013. Pt. has recently experienced weakness in his right hand functioning limiting his ability to complete ADL, and IADL tasks.    Currently in Pain? No/denies                      OT Treatments/Exercises (OP) - 08/21/16 1346      Fine Motor Coordination   Other Fine Motor Exercises Pt. worked on grasping, flipping, and turning  pegs on the [employer]. Pt. worked on grasping, and removing 1"  Resistive cubes on a vertical board. Pt. worked on grasping 1/8" beads, and placed them on a mini dowel. Pt. worked on grasping 1/4" objects using tweezers. Pt. worked on removing them while alternating thumb opposition to 2nd through 5th digits.                OT Education - 08/21/16 1329    Education provided Yes   Education Details [facility]   Person(s) Educated Patient   Methods Demonstration;Explanation   Comprehension Verbalized understanding;Returned demonstration;Verbal cues required             OT Long Term Goals - 08/13/16 1402      OT LONG TERM GOAL #1   Title Pt. will increase RUE strength by 2 mm grades to assist with ADL/IADLs   Baseline limited RUE strength   Time 12   Period Weeks   Status On-going     OT LONG TERM GOAL #2   Title Pt. will increase right grip strength by 10# to be able to hold and fill dog bowls.   Baseline Right grip 39#, Left Grip 66#   Time 12   Period Weeks   Status On-going     OT LONG TERM GOAL #3  Title Pt. will improve right hand coordination to be able to independnetly and efficiently turn pages in a magazine   Baseline Pt. has difficulty   Time 12   Period Weeks   Status On-going     OT LONG TERM GOAL #4   Title Pt. will improve right pinch lateral strength by 3# to tbe able to use a key.    Baseline Right; 15#, Left 23#   Time 12   Period Weeks   Status On-going     OT LONG TERM GOAL #5   Title Pt. will improve 3 point pinch to be able to manage zippers.   Baseline right: 9#, Left: 16#    Time 12   Period Weeks   Status On-going     OT LONG TERM GOAL #6   Title Pt. will improve independently write a 3 sentance paragraph efficiently with 100% legibility.   Baseline 50% legibility   Time 12   Period Weeks   Status On-going               Plan - 08/21/16 1331    Clinical Impression Statement Pt. reports he has been having some  plumbing issues with his kitchen renovations. Pt. continues to work on improving [facility] skills, speed, dexterity, and translatory movements of the hand.   Rehab Potential Good   OT Frequency 2x / week   OT Duration 12 weeks   OT Treatment/Interventions Moist Heat;Patient/family education;Therapeutic exercises;Therapeutic activities;Energy conservation;Visual/perceptual remediation/compensation;Neuromuscular education;Passive range of motion;Therapeutic exercise;Self-care/ADL training      Patient will benefit from skilled therapeutic intervention in order to improve the following deficits and impairments:  Decreased coordination, Decreased strength, Decreased mobility, Decreased balance, Impaired UE functional use, Decreased activity tolerance  Visit Diagnosis: Other lack of coordination    Problem List Patient Active Problem List   Diagnosis Date Noted  . Hyperlipidemia 12/28/2014  . Adiposity 11/02/2014  . Chronic inflammatory demyelinating polyneuritis ([place]) 09/10/2013  . Diabetes ([place]) 09/10/2013  . Essential hypertension 09/10/2013  . Obstructive apnea 09/10/2013  . Allergic rhinitis, seasonal 09/10/2013    Harrel Carina, MS, OTR/L 08/21/2016, 2:44 PM  [place] MAIN [facility] SERVICES [address] [place], [place], [address] Phone: [phone]   Fax:  [phone]  Name: Phillip Fitzgerald MRN: [id number] Date of Birth: Feb 22, 1951

## 2016-08-21 NOTE — Patient Instructions (Signed)
OT TREATMENT     Neuro muscular re-education:  Pt. worked on grasping, flipping, and turning pegs on the [employer]. Pt. worked on grasping, and removing 1"  Resistive cubes on a vertical board. Pt. worked on grasping 1/8" beads, and placed them on a mini dowel. Pt. worked on grasping 1/4" objects using tweezers. Pt. worked on removing them while alternating thumb opposition to 2nd through 5th digits.  Therapeutic Exercise:  Selfcare:  Manual Therapy:

## 2016-08-23 ENCOUNTER — Ambulatory Visit: Payer: Medicare Other | Attending: Neurology | Admitting: Occupational Therapy

## 2016-08-23 DIAGNOSIS — M6281 Muscle weakness (generalized): Secondary | ICD-10-CM | POA: Insufficient documentation

## 2016-08-23 DIAGNOSIS — R278 Other lack of coordination: Secondary | ICD-10-CM | POA: Diagnosis not present

## 2016-08-23 NOTE — Patient Instructions (Signed)
OT TREATMENT     Neuro muscular re-education:  Pt. worked on tasks to sustain lateral pinch on resistive tweezers while grasping and moving 2" toothpick sticks from a horizontal flat position to a vertical position in order to place it in the holder. Pt. was able to sustain grasp while positioning and extending the wrist/hand in the necessary alignment needed to place the stick through the top of the holder. Pt. worked on grasping 2" sticks, and removing them with tweezers. Pt. worked using a Engineer, production to remove glasses screws. Pt. had difficulty using a miniature screwdriver. Pt. worked on Nurse, children's.  Therapeutic Exercise:  Selfcare:  Manual Therapy:

## 2016-08-23 NOTE — Therapy (Addendum)
[place] MAIN [facility] SERVICES [address] [place], [place], [address] Phone: [phone]   Fax:  [phone]  Occupational Therapy Treatment  Patient Details  Name: Phillip Fitzgerald MRN: [id number] Date of Birth: 03-26-1951 Referring Provider: Dr. Manuella Ghazi  Encounter Date: 08/23/2016      OT End of Session - 08/23/16 0942    Visit Number 13   Number of Visits 24   Date for OT Re-Evaluation 09/25/16   Authorization Type Medicare G code 13   OT Start Time 0930   OT Stop Time 1011   OT Time Calculation (min) 41 min   Activity Tolerance Patient tolerated treatment well   Behavior During Therapy [facility] for tasks assessed/performed      Past Medical History:  Diagnosis Date  . CIDP (chronic inflammatory demyelinating polyneuropathy) ([place])   . CIDP (chronic inflammatory demyelinating polyneuropathy) ([place])   . Diabetes mellitus without complication ([place])   . Elevated lipids   . Hypertension   . Sleep apnea     Past Surgical History:  Procedure Laterality Date  . APPENDECTOMY    . COLONOSCOPY  2011   cleared for 5 yrs- Dr Allen Norris  . KNEE SURGERY Right    scope  . PORTACATH PLACEMENT Right 09/16/2015   Procedure: INSERTION PORT-A-CATH;  Surgeon: Leonie Green, MD;  Location: [facility] ORS;  Service: General;  Laterality: Right;  . TONSILLECTOMY      There were no vitals filed for this visit.      Subjective Assessment - 08/23/16 0936    Subjective  Pt. reports they completed the plumbing work.   Pertinent History Pt. is a 66 y.o. who was initially diagnosed with CIDP in February of 2013. Pt. has recently experienced weakness in his right hand functioning limiting his ability to complete ADL, and IADL tasks.    Patient Stated Goals To be able to use his right hand, and index finger again.   Currently in Pain? No/denies                      OT Treatments/Exercises (OP) - 08/23/16 0001      Fine Motor Coordination   Other Fine Motor Exercises Pt. worked on tasks to sustain lateral pinch on resistive tweezers while grasping and moving 2" toothpick sticks from a horizontal flat position to a vertical position in order to place it in the holder. Pt. was able to sustain grasp while positioning and extending the wrist/hand in the necessary alignment needed to place the stick through the top of the holder. Pt. worked on grasping 2" sticks, and removing them with tweezers. Pt. worked using a Engineer, production to remove glasses screws. Pt. had difficulty using a miniature screwdriver. Pt. worked on Nurse, children's.                OT Education - 08/23/16 [phone]    Education provided Yes   Education Details [facility] skills   Person(s) Educated Patient   Methods Explanation;Demonstration   Comprehension Verbalized understanding;Returned demonstration;Verbal cues required             OT Long Term Goals - 08/13/16 1402      OT LONG TERM GOAL #1   Title Pt. will increase RUE strength by 2 mm grades to assist with ADL/IADLs   Baseline limited RUE strength   Time 12   Period Weeks   Status On-going     OT LONG TERM GOAL #2  Title Pt. will increase right grip strength by 10# to be able to hold and fill dog bowls.   Baseline Right grip 39#, Left Grip 66#   Time 12   Period Weeks   Status On-going     OT LONG TERM GOAL #3   Title Pt. will improve right hand coordination to be able to independnetly and efficiently turn pages in a magazine   Baseline Pt. has difficulty   Time 12   Period Weeks   Status On-going     OT LONG TERM GOAL #4   Title Pt. will improve right pinch lateral strength by 3# to tbe able to use a key.    Baseline Right; 15#, Left 23#   Time 12   Period Weeks   Status On-going     OT LONG TERM GOAL #5   Title Pt. will improve 3 point pinch to be able to manage zippers.   Baseline right: 9#, Left: 16#    Time 12   Period Weeks   Status On-going     OT LONG TERM  GOAL #6   Title Pt. will improve independently write a 3 sentance paragraph efficiently with 100% legibility.   Baseline 50% legibility   Time 12   Period Weeks   Status On-going               Plan - 08/23/16 0943    Clinical Impression Statement Pt. reports not feeling great today. Pt. reports he feels like he has a stomach bug, and was almost not going to come to therapy today.  Pt. continues to work on improving RUE functioning, and coordination for use during ADLs, and IADLs.   Rehab Potential Good   OT Frequency 2x / week   OT Duration 12 weeks   OT Treatment/Interventions Moist Heat;Patient/family education;Therapeutic exercises;Therapeutic activities;Energy conservation;Visual/perceptual remediation/compensation;Neuromuscular education;Passive range of motion;Therapeutic exercise;Self-care/ADL training   Consulted and Agree with Plan of Care Patient      Patient will benefit from skilled therapeutic intervention in order to improve the following deficits and impairments:  Decreased coordination, Decreased strength, Decreased mobility, Decreased balance, Impaired UE functional use, Decreased activity tolerance  Visit Diagnosis: Muscle weakness (generalized)  Other lack of coordination    Problem List Patient Active Problem List   Diagnosis Date Noted  . Hyperlipidemia 12/28/2014  . Adiposity 11/02/2014  . Chronic inflammatory demyelinating polyneuritis ([place]) 09/10/2013  . Diabetes ([place]) 09/10/2013  . Essential hypertension 09/10/2013  . Obstructive apnea 09/10/2013  . Allergic rhinitis, seasonal 09/10/2013    Harrel Carina, MS, OTR/L 08/23/2016, 11:25 AM  [place] MAIN [facility] SERVICES [address] [place], [place], [address] Phone: [phone]   Fax:  [phone]  Name: Phillip Fitzgerald MRN: [id number] Date of Birth: 03/31/1951

## 2016-08-28 ENCOUNTER — Ambulatory Visit: Payer: Medicare Other | Admitting: Occupational Therapy

## 2016-08-28 DIAGNOSIS — R278 Other lack of coordination: Secondary | ICD-10-CM

## 2016-08-28 DIAGNOSIS — M6281 Muscle weakness (generalized): Secondary | ICD-10-CM | POA: Diagnosis not present

## 2016-08-28 NOTE — Patient Instructions (Signed)
OT TREATMENT     Neuro muscular re-education:  Pt. worked on grasping coins from a tabletop surface, placing them into a resistive container, and pushing them through the slot while isolating his 2nd digit. Pt. performed [facility] tasks using the grooved pegboard. Pt. worked on grasping the grooved pegs from a horizontal position, and moving the pegs to a vertical position in the hand to prepare for placing them in the grooved slot. Pt. worked on grasping 1" resistive cubes from vertical angle, and pressing them into place alternating isolated 2nd, and 3rd digit. Pt. Worked on placing standard clothespins using his 2nd, and 3rd digits individually with his thumb.  Therapeutic Exercise:  Pt. worked on the [employer] for 8 min. With constant monitoring of the BUEs. Pt. worked on changing, and alternating forward reverse position every 2 min. Rest breaks were required.  Pt. worked on the digiflex.1.5   Selfcare:  Manual Therapy:

## 2016-08-28 NOTE — Therapy (Addendum)
[place] MAIN [facility] SERVICES [address] [place], [place], [address] Phone: [phone]   Fax:  [phone]  Occupational Therapy Treatment  Patient Details  Name: Phillip Fitzgerald MRN: [id number] Date of Birth: [birth year] Referring Provider: Dr. Manuella Ghazi  Encounter Date: 08/28/2016      OT End of Session - 08/28/16 1741    Visit Number 14   Number of Visits 24   Date for OT Re-Evaluation 09/25/16   Authorization Type Medicare G code 14   OT Start Time 1300   OT Stop Time 1340   OT Time Calculation (min) 40 min   Activity Tolerance Patient tolerated treatment well   Behavior During Therapy [facility] for tasks assessed/performed      Past Medical History:  Diagnosis Date  . CIDP (chronic inflammatory demyelinating polyneuropathy) ([place])   . CIDP (chronic inflammatory demyelinating polyneuropathy) ([place])   . Diabetes mellitus without complication ([place])   . Elevated lipids   . Hypertension   . Sleep apnea     Past Surgical History:  Procedure Laterality Date  . APPENDECTOMY    . COLONOSCOPY  2011   cleared for 5 yrs- Dr Allen Norris  . KNEE SURGERY Right    scope  . PORTACATH PLACEMENT Right 09/16/2015   Procedure: INSERTION PORT-A-CATH;  Surgeon: Leonie Green, MD;  Location: [facility] ORS;  Service: General;  Laterality: Right;  . TONSILLECTOMY      There were no vitals filed for this visit.      Subjective Assessment - 08/28/16 1739    Subjective  Pt. reports having allergies from the pollen.   Pertinent History Pt. is a 66 y.o. who was initially diagnosed with CIDP in February of 2013. Pt. has recently experienced weakness in his right hand functioning limiting his ability to complete ADL, and IADL tasks.    Currently in Pain? No/denies                      OT Treatments/Exercises (OP) - 08/28/16 1340      Fine Motor Coordination   Other Fine Motor Exercises Pt. worked on grasping coins from a tabletop surface,  placing them into a resistive container, and pushing them through the slot while isolating his 2nd digit. Pt. performed [facility] tasks using the grooved pegboard. Pt. worked on grasping the grooved pegs from a horizontal position, and moving the pegs to a vertical position in the hand to prepare for placing them in the grooved slot. Pt. worked on grasping 1" resistive cubes from vertical angle, and pressing them into place alternating isolated 2nd, and 3rd digit. Pt. Worked on placing standard clothespins using his 2nd, and 3rd digits individually with his thumb.     Neurological Re-education Exercises   Other Exercises 1 Pt. worked on the [place] for 4 min. With constant monitoring of the BUEs. Pt. worked on changing, and alternating forward reverse position every 2 min. Rest breaks were required.  Pt. worked on the digiflex 1.5                OT Education - 08/28/16 1740    Education provided Yes   Education Details [facility]   Person(s) Educated Patient   Methods Explanation;Demonstration   Comprehension Verbalized understanding;Returned demonstration;Verbal cues required             OT Long Term Goals - 08/13/16 1402      OT LONG TERM GOAL #1   Title Pt. will increase RUE  strength by 2 mm grades to assist with ADL/IADLs   Baseline limited RUE strength   Time 12   Period Weeks   Status On-going     OT LONG TERM GOAL #2   Title Pt. will increase right grip strength by 10# to be able to hold and fill dog bowls.   Baseline Right grip 39#, Left Grip 66#   Time 12   Period Weeks   Status On-going     OT LONG TERM GOAL #3   Title Pt. will improve right hand coordination to be able to independnetly and efficiently turn pages in a magazine   Baseline Pt. has difficulty   Time 12   Period Weeks   Status On-going     OT LONG TERM GOAL #4   Title Pt. will improve right pinch lateral strength by 3# to tbe able to use a key.    Baseline Right; 15#, Left 23#   Time 12   Period Weeks    Status On-going     OT LONG TERM GOAL #5   Title Pt. will improve 3 point pinch to be able to manage zippers.   Baseline right: 9#, Left: 16#    Time 12   Period Weeks   Status On-going     OT LONG TERM GOAL #6   Title Pt. will improve independently write a 3 sentance paragraph efficiently with 100% legibility.   Baseline 50% legibility   Time 12   Period Weeks   Status On-going               Plan - 08/28/16 1741    Clinical Impression Statement Pt. continues to not feel well from his allergies. Pt. is still having kitchen renovations. Pt. continues to work on improving [facility] skills to improve hand function during ADLs, and IADLs,   Rehab Potential Good   OT Frequency 2x / week   OT Duration 12 weeks   OT Treatment/Interventions Moist Heat;Patient/family education;Therapeutic exercises;Therapeutic activities;Energy conservation;Visual/perceptual remediation/compensation;Neuromuscular education;Passive range of motion;Therapeutic exercise;Self-care/ADL training   Consulted and Agree with Plan of Care Patient      Patient will benefit from skilled therapeutic intervention in order to improve the following deficits and impairments:  Decreased coordination, Decreased strength, Decreased mobility, Decreased balance, Impaired UE functional use, Decreased activity tolerance  Visit Diagnosis: Other lack of coordination  Muscle weakness (generalized)    Problem List Patient Active Problem List   Diagnosis Date Noted  . Hyperlipidemia 12/28/2014  . Adiposity 11/02/2014  . Chronic inflammatory demyelinating polyneuritis ([place]) 09/10/2013  . Diabetes ([place]) 09/10/2013  . Essential hypertension 09/10/2013  . Obstructive apnea 09/10/2013  . Allergic rhinitis, seasonal 09/10/2013    Harrel Carina, MS, OTR/L 08/28/2016, 5:46 PM  [place] MAIN [facility] SERVICES [address] [place], [place], [address] Phone: [phone]   Fax:   [phone]  Name: Phillip Fitzgerald MRN: [id number] Date of Birth: [birth year]

## 2016-08-30 ENCOUNTER — Ambulatory Visit: Payer: Medicare Other | Admitting: Occupational Therapy

## 2016-08-30 DIAGNOSIS — M6281 Muscle weakness (generalized): Secondary | ICD-10-CM

## 2016-08-30 DIAGNOSIS — R278 Other lack of coordination: Secondary | ICD-10-CM | POA: Diagnosis not present

## 2016-08-30 NOTE — Patient Instructions (Signed)
OT TREATMENT     Neuro muscular re-education:  [place] Pegboard test was administered. Pt. Average score for right hand: 9.6, Left Hand: 11.3, Both hands: 8, Right/left/both: 29, As.sembly: 16. Pt. Reports right hand fatigue following session. Pt. Required visual demonstration, and verbal cues.Pt. worked on challenging right hand [place] skills in standing. Pt. dropped multiple pegs.  Therapeutic Exercise:  Selfcare:  Manual Therapy:

## 2016-08-30 NOTE — Therapy (Addendum)
[place] MAIN [facility] SERVICES [address] [place], [place], [address] Phone: [phone]   Fax:  [phone]  Occupational Therapy Treatment  Patient Details  Name: Phillip Fitzgerald MRN: [id number] Date of Birth: 23-Jan-1951 Referring Provider: Dr. Manuella Ghazi  Encounter Date: 08/30/2016      OT End of Session - 08/30/16 1201    Visit Number 15   Number of Visits 24   Date for OT Re-Evaluation 09/25/16   Authorization Type Medicare G code 15   OT Start Time 1100   OT Stop Time 1145   OT Time Calculation (min) 45 min   Activity Tolerance Patient tolerated treatment well   Behavior During Therapy [facility] for tasks assessed/performed      Past Medical History:  Diagnosis Date  . CIDP (chronic inflammatory demyelinating polyneuropathy) ([place])   . CIDP (chronic inflammatory demyelinating polyneuropathy) ([place])   . Diabetes mellitus without complication ([place])   . Elevated lipids   . Hypertension   . Sleep apnea     Past Surgical History:  Procedure Laterality Date  . APPENDECTOMY    . COLONOSCOPY  2011   cleared for 5 yrs- Dr Allen Norris  . KNEE SURGERY Right    scope  . PORTACATH PLACEMENT Right 09/16/2015   Procedure: INSERTION PORT-A-CATH;  Surgeon: Leonie Green, MD;  Location: [facility] ORS;  Service: General;  Laterality: Right;  . TONSILLECTOMY      There were no vitals filed for this visit.      Subjective Assessment - 08/30/16 1201    Subjective  Pt. reports he can now breathe better.   Pertinent History Pt. is a 66 y.o. who was initially diagnosed with CIDP in February of 2013. Pt. has recently experienced weakness in his right hand functioning limiting his ability to complete ADL, and IADL tasks.    Currently in Pain? No/denies                      OT Treatments/Exercises (OP) - 08/30/16 1212      Fine Motor Coordination   Other Fine Motor Exercises [place] Pegboard test was administered. Pt. Average score for right  hand: 9.6, Left Hand: 11.3, Both hands: 8, Right/left/both: 29, As.sembly: 16. Pt. Reports right hand fatigue following session. Pt. Required visual demonstration, and verbal cues.Pt. worked on challenging right hand [place] skills in standing. Pt. dropped multiple pegs.                OT Education - 08/30/16 1201    Education provided Yes   Education Details [facility]   Person(s) Educated Patient   Methods Explanation;Demonstration   Comprehension Verbalized understanding;Returned demonstration;Verbal cues required             OT Long Term Goals - 08/13/16 1402      OT LONG TERM GOAL #1   Title Pt. will increase RUE strength by 2 mm grades to assist with ADL/IADLs   Baseline limited RUE strength   Time 12   Period Weeks   Status On-going     OT LONG TERM GOAL #2   Title Pt. will increase right grip strength by 10# to be able to hold and fill dog bowls.   Baseline Right grip 39#, Left Grip 66#   Time 12   Period Weeks   Status On-going     OT LONG TERM GOAL #3   Title Pt. will improve right hand coordination to be able to independnetly and efficiently  turn pages in a magazine   Baseline Pt. has difficulty   Time 12   Period Weeks   Status On-going     OT LONG TERM GOAL #4   Title Pt. will improve right pinch lateral strength by 3# to tbe able to use a key.    Baseline Right; 15#, Left 23#   Time 12   Period Weeks   Status On-going     OT LONG TERM GOAL #5   Title Pt. will improve 3 point pinch to be able to manage zippers.   Baseline right: 9#, Left: 16#    Time 12   Period Weeks   Status On-going     OT LONG TERM GOAL #6   Title Pt. will improve independently write a 3 sentance paragraph efficiently with 100% legibility.   Baseline 50% legibility   Time 12   Period Weeks   Status On-going               Plan - 08/30/16 1202    Clinical Impression Statement Pt. continues to work on improving right hand speed, coordination, manipulation, dexterity,  bilateral hand coordination skills, and challenging [facility] skills in multiple contexts, and positions in standing while reaching.   Rehab Potential Good   OT Frequency 2x / week   OT Duration 12 weeks   OT Treatment/Interventions Moist Heat;Patient/family education;Therapeutic exercises;Therapeutic activities;Energy conservation;Visual/perceptual remediation/compensation;Neuromuscular education;Passive range of motion;Therapeutic exercise;Self-care/ADL training   Consulted and Agree with Plan of Care Patient      Patient will benefit from skilled therapeutic intervention in order to improve the following deficits and impairments:  Decreased coordination, Decreased strength, Decreased mobility, Decreased balance, Impaired UE functional use, Decreased activity tolerance  Visit Diagnosis: Muscle weakness (generalized)    Problem List Patient Active Problem List   Diagnosis Date Noted  . Hyperlipidemia 12/28/2014  . Adiposity 11/02/2014  . Chronic inflammatory demyelinating polyneuritis ([place]) 09/10/2013  . Diabetes ([place]) 09/10/2013  . Essential hypertension 09/10/2013  . Obstructive apnea 09/10/2013  . Allergic rhinitis, seasonal 09/10/2013    Luberta Mutter, OTR/L 08/30/2016, 12:13 PM  [place] MAIN [facility] SERVICES [address] [place], [place], [address] Phone: [phone]   Fax:  [phone]  Name: Phillip Fitzgerald MRN: [id number] Date of Birth: 1950/07/24

## 2016-09-05 ENCOUNTER — Ambulatory Visit: Payer: Medicare Other | Attending: Neurology | Admitting: Occupational Therapy

## 2016-09-05 DIAGNOSIS — M6281 Muscle weakness (generalized): Secondary | ICD-10-CM | POA: Diagnosis not present

## 2016-09-05 DIAGNOSIS — R278 Other lack of coordination: Secondary | ICD-10-CM | POA: Diagnosis not present

## 2016-09-05 NOTE — Therapy (Signed)
[place] MAIN [facility] SERVICES [address] [place], [place], [address] Phone: [phone]   Fax:  [phone]  Occupational Therapy Treatment  Patient Details  Name: Phillip Fitzgerald MRN: [id number] Date of Birth: [birth year] Referring Provider: Dr. Manuella Ghazi  Encounter Date: 09/05/2016      OT End of Session - 09/05/16 1344    Visit Number 16   Number of Visits 24   Date for OT Re-Evaluation 09/25/16   Authorization Type Medicare G code 16   OT Start Time 1950   OT Stop Time 1345   OT Time Calculation (min) 42 min   Activity Tolerance Patient tolerated treatment well   Behavior During Therapy [facility] for tasks assessed/performed      Past Medical History:  Diagnosis Date  . CIDP (chronic inflammatory demyelinating polyneuropathy) ([place])   . CIDP (chronic inflammatory demyelinating polyneuropathy) ([place])   . Diabetes mellitus without complication ([place])   . Elevated lipids   . Hypertension   . Sleep apnea     Past Surgical History:  Procedure Laterality Date  . APPENDECTOMY    . COLONOSCOPY  2011   cleared for 5 yrs- Dr Allen Norris  . KNEE SURGERY Right    scope  . PORTACATH PLACEMENT Right 09/16/2015   Procedure: INSERTION PORT-A-CATH;  Surgeon: Leonie Green, MD;  Location: [facility] ORS;  Service: General;  Laterality: Right;  . TONSILLECTOMY      There were no vitals filed for this visit.      Subjective Assessment - 09/05/16 1339    Subjective  Pt. reports his hand felt fine after performing [facility] tasks after last session.                      OT Treatments/Exercises (OP) - 09/05/16 0001      ADLs   ADL Comments Pt. Worked on Estate agent. Pt. writes legibly in larger text., however when challenged to smaller writing, his writing becomes significantly less legible.      Fine Motor Coordination   Other Fine Motor Exercises Pt. worked on [facility] in standing to grasping, flipping, and stacking pegs on the [employer]. Pt.  Dropped multiple pegs. Pt. worked on grasping, storing washers, and placing them on a dowel while reaching up with his RUE into shoulder flexion, and abduction to place the washers on a dowel positioned horizontally.                      OT Long Term Goals - 09/05/16 1356      OT LONG TERM GOAL #1   Title Pt. will increase RUE strength by 2 mm grades to assist with ADL/IADLs   Baseline limited RUE strength   Time 12   Period Weeks   Status On-going     OT LONG TERM GOAL #2   Title Pt. will increase right grip strength by 10# to be able to hold and fill dog bowls.   Baseline Right grip 39#, Left Grip 66#   Time 12   Period Weeks   Status On-going     OT LONG TERM GOAL #3   Title Pt. will improve right hand coordination to be able to independnetly and efficiently turn pages in a magazine   Baseline Pt. has difficulty   Time 12   Period Weeks   Status On-going     OT LONG TERM GOAL #4   Title Pt. will improve right pinch lateral strength by  3# to tbe able to use a key.    Baseline Right; 15#, Left 23#   Time 12   Period Weeks   Status On-going     OT LONG TERM GOAL #5   Title Pt. will improve 3 point pinch to be able to manage zippers.   Baseline right: 9#, Left: 16#    Time 12   Period Weeks   Status On-going     OT LONG TERM GOAL #6   Title Pt. will improve independently write a 3 sentance paragraph efficiently with 100% legibility.   Baseline 50% legibility   Time 12   Period Weeks   Status On-going               Plan - 09/05/16 1350    Clinical Impression Statement Pt. is making progress, however pt. legibility decreased when challenged with smaller writing. Pt. continues to work on improving right hand strength, and coordination skills for improved engagement in ADL, and IADL tasks.    Rehab Potential Good   OT Frequency 2x / week   OT Duration 12 weeks   OT Treatment/Interventions Moist Heat;Patient/family education;Therapeutic  exercises;Therapeutic activities;Energy conservation;Visual/perceptual remediation/compensation;Neuromuscular education;Passive range of motion;Therapeutic exercise;Self-care/ADL training   Consulted and Agree with Plan of Care Patient      Patient will benefit from skilled therapeutic intervention in order to improve the following deficits and impairments:  Decreased coordination, Decreased strength, Decreased mobility, Decreased balance, Impaired UE functional use, Decreased activity tolerance  Visit Diagnosis: Muscle weakness (generalized)  Other lack of coordination    Problem List Patient Active Problem List   Diagnosis Date Noted  . Hyperlipidemia 12/28/2014  . Adiposity 11/02/2014  . Chronic inflammatory demyelinating polyneuritis ([place]) 09/10/2013  . Diabetes ([place]) 09/10/2013  . Essential hypertension 09/10/2013  . Obstructive apnea 09/10/2013  . Allergic rhinitis, seasonal 09/10/2013    Harrel Carina, MS, OTR/L 09/05/2016, 5:55 PM  [place] MAIN [facility] SERVICES [address] [place], [place], [address] Phone: [phone]   Fax:  [phone]  Name: SOFIA JAQUITH MRN: [id number] Date of Birth: 07-Sep-1950

## 2016-09-05 NOTE — Patient Instructions (Addendum)
OT TREATMENT     Neuro muscular re-education:  Pt. worked on [facility] in standing to grasping, flipping, and stacking pegs on the [employer]. Pt. Dropped multiple pegs. Pt. worked on grasping, storing washers, and placing them on a dowel while reaching up with his RUE into shoulder flexion, and abduction to place the washers on a dowel positioned horizontally.    Therapeutic Exercise:  Selfcare: Pt. Worked on Estate agent. Pt. writes legibly in larger text., however when challenged to smaller writing, his writing becomes significantly less legible.   Manual Therapy:

## 2016-09-07 ENCOUNTER — Encounter: Payer: Self-pay | Admitting: Occupational Therapy

## 2016-09-07 ENCOUNTER — Ambulatory Visit: Payer: Medicare Other | Admitting: Occupational Therapy

## 2016-09-07 DIAGNOSIS — M6281 Muscle weakness (generalized): Secondary | ICD-10-CM

## 2016-09-07 DIAGNOSIS — R278 Other lack of coordination: Secondary | ICD-10-CM | POA: Diagnosis not present

## 2016-09-07 NOTE — Therapy (Signed)
[place] MAIN [facility] SERVICES [address] [place], [place], [address] Phone: [phone]   Fax:  [phone]  Occupational Therapy Treatment  Patient Details  Name: Phillip Fitzgerald MRN: [id number] Date of Birth: 05/24/1950 Referring Provider: Dr. Manuella Ghazi  Encounter Date: 09/07/2016      OT End of Session - 09/07/16 1123    Visit Number 17   Number of Visits 24   Date for OT Re-Evaluation 09/25/16   Authorization Type Medicare G code 71   OT Start Time 1100   OT Stop Time 1145   OT Time Calculation (min) 45 min   Activity Tolerance Patient tolerated treatment well   Behavior During Therapy [facility] for tasks assessed/performed      Past Medical History:  Diagnosis Date  . CIDP (chronic inflammatory demyelinating polyneuropathy) ([place])   . CIDP (chronic inflammatory demyelinating polyneuropathy) ([place])   . Diabetes mellitus without complication ([place])   . Elevated lipids   . Hypertension   . Sleep apnea     Past Surgical History:  Procedure Laterality Date  . APPENDECTOMY    . COLONOSCOPY  2011   cleared for 5 yrs- Dr Allen Norris  . KNEE SURGERY Right    scope  . PORTACATH PLACEMENT Right 09/16/2015   Procedure: INSERTION PORT-A-CATH;  Surgeon: Leonie Green, MD;  Location: [facility] ORS;  Service: General;  Laterality: Right;  . TONSILLECTOMY      There were no vitals filed for this visit.      Subjective Assessment - 09/07/16 1119    Subjective  Pt. reports his right hand is improving.   Pertinent History Pt. is a 66 y.o. who was initially diagnosed with CIDP in February of 2013. Pt. has recently experienced weakness in his right hand functioning limiting his ability to complete ADL, and IADL tasks.    Patient Stated Goals To be able to use his right hand, and index finger again.   Currently in Pain? No/denies                      OT Treatments/Exercises (OP) - 09/07/16 0001      Fine Motor Coordination   Other Fine  Motor Exercises Pt. worked on grasping, flipping, and manipulating [place] manual dexterity pegs with his right hand performed in sitting, and standing. Pt. worked on on grasping 1/4" pegs with his right hand, after working on grasping, and storing them in his hand.  Pt. worked on Occupational hygienist removing the pegs controlling, and using long nosed tweezers. Pt. worked on grasping 1/8" objects, removing them from shallow small shallow dish, and placing them on a tiny dowel.                OT Education - 09/07/16 1122    Education provided Yes   Education Details [facility]   Person(s) Educated Patient   Methods Explanation;Demonstration   Comprehension Verbalized understanding             OT Long Term Goals - 09/05/16 1356      OT LONG TERM GOAL #1   Title Pt. will increase RUE strength by 2 mm grades to assist with ADL/IADLs   Baseline limited RUE strength   Time 12   Period Weeks   Status On-going     OT LONG TERM GOAL #2   Title Pt. will increase right grip strength by 10# to be able to hold and fill dog bowls.   Baseline Right  grip 39#, Left Grip 66#   Time 12   Period Weeks   Status On-going     OT LONG TERM GOAL #3   Title Pt. will improve right hand coordination to be able to independnetly and efficiently turn pages in a magazine   Baseline Pt. has difficulty   Time 12   Period Weeks   Status On-going     OT LONG TERM GOAL #4   Title Pt. will improve right pinch lateral strength by 3# to tbe able to use a key.    Baseline Right; 15#, Left 23#   Time 12   Period Weeks   Status On-going     OT LONG TERM GOAL #5   Title Pt. will improve 3 point pinch to be able to manage zippers.   Baseline right: 9#, Left: 16#    Time 12   Period Weeks   Status On-going     OT LONG TERM GOAL #6   Title Pt. will improve independently write a 3 sentance paragraph efficiently with 100% legibility.   Baseline 50% legibility   Time 12   Period Weeks   Status On-going                Plan - 09/07/16 1123    Clinical Impression Statement Pt. is making progress overall. Pt. reports his hands tend to get sweaty, and objects stick to his hand  increasing the difficulty of them moving within his hand. Pt. continues to work on challenging  [place] in sitting, and standing. Pt. continues to work on improving right hand coordination, manipulation, speed, and dexterity for improved control, and use druing ADLs, and IADLs.   Rehab Potential Good   OT Frequency 2x / week   OT Duration 12 weeks   OT Treatment/Interventions Moist Heat;Patient/family education;Therapeutic exercises;Therapeutic activities;Energy conservation;Visual/perceptual remediation/compensation;Neuromuscular education;Passive range of motion;Therapeutic exercise;Self-care/ADL training   Consulted and Agree with Plan of Care Patient      Patient will benefit from skilled therapeutic intervention in order to improve the following deficits and impairments:  Decreased coordination, Decreased strength, Decreased mobility, Decreased balance, Impaired UE functional use, Decreased activity tolerance  Visit Diagnosis: Muscle weakness (generalized)  Other lack of coordination    Problem List Patient Active Problem List   Diagnosis Date Noted  . Hyperlipidemia 12/28/2014  . Adiposity 11/02/2014  . Chronic inflammatory demyelinating polyneuritis ([place]) 09/10/2013  . Diabetes ([place]) 09/10/2013  . Essential hypertension 09/10/2013  . Obstructive apnea 09/10/2013  . Allergic rhinitis, seasonal 09/10/2013    Harrel Carina, MS, OTR/L 09/07/2016, 11:46 AM  [place] MAIN [facility] SERVICES [address] [place], [place], [address] Phone: [phone]   Fax:  [phone]  Name: ELIOT POPPER MRN: [id number] Date of Birth: [birth year]

## 2016-09-07 NOTE — Patient Instructions (Signed)
OT TREATMENT     Neuro muscular re-education:  Pt. worked on grasping, flipping, and manipulating [place] manual dexterity pegs with his right hand performed in sitting, and standing. Pt. worked on on grasping 1/4" pegs with his right hand, after working on grasping, and storing them in his hand.  Pt. worked on Occupational hygienist removing the pegs controlling, and using long nosed tweezers.   Therapeutic Exercise:  Selfcare:  Manual Therapy:

## 2016-09-11 ENCOUNTER — Ambulatory Visit: Payer: Medicare Other

## 2016-09-11 ENCOUNTER — Encounter: Payer: Self-pay | Admitting: Occupational Therapy

## 2016-09-11 ENCOUNTER — Ambulatory Visit: Payer: Medicare Other | Admitting: Oncology

## 2016-09-11 ENCOUNTER — Ambulatory Visit: Payer: Medicare Other | Admitting: Occupational Therapy

## 2016-09-11 DIAGNOSIS — R278 Other lack of coordination: Secondary | ICD-10-CM

## 2016-09-11 DIAGNOSIS — M6281 Muscle weakness (generalized): Secondary | ICD-10-CM

## 2016-09-11 NOTE — Patient Instructions (Signed)
OT TREATMENT     Neuro muscular re-education:  Pt. worked on tasks to sustain lateral pinch on resistive tweezers while grasping and moving 2" toothpick sticks from a horizontal flat position to a vertical position in order to place it in the holder. Pt. was able to sustain grasp while positioning and extending the wrist/hand in the necessary alignment needed to place the stick through the top of the holder. Pt. worked on grasping single wide clothespins of varying sizes with his right hand. Pt. Worked on reaching in various planes with his right UE. Pt. worked on Chief Financial Officer, storing them in his palm, moving them from his palm to the tip of his 2nd digit and thumb, and placing them on a tiny horizontal dowel. Pt. worked on removing them one by one with his 2nd digit, and thumb. Pt. Worked on grasping 1/4" collars. Pt. Worked on [facility] activities while in standing.  Therapeutic Exercise:  Selfcare:  Manual Therapy:

## 2016-09-11 NOTE — Therapy (Signed)
[place] MAIN [facility] SERVICES [address] [place], [place], [address] Phone: [phone]   Fax:  [phone]  Occupational Therapy Treatment  Patient Details  Name: Phillip Fitzgerald MRN: [id number] Date of Birth: 10-13-1950 Referring Provider: Dr. Manuella Ghazi  Encounter Date: 09/11/2016      OT End of Session - 09/11/16 1527    Visit Number 18   Number of Visits 24   Date for OT Re-Evaluation 09/25/16   Authorization Type Medicare G code 18   OT Start Time 7253   OT Stop Time 1430   OT Time Calculation (min) 45 min   Activity Tolerance Patient tolerated treatment well   Behavior During Therapy [facility] for tasks assessed/performed      Past Medical History:  Diagnosis Date  . CIDP (chronic inflammatory demyelinating polyneuropathy) ([place])   . CIDP (chronic inflammatory demyelinating polyneuropathy) ([place])   . Diabetes mellitus without complication ([place])   . Elevated lipids   . Hypertension   . Sleep apnea     Past Surgical History:  Procedure Laterality Date  . APPENDECTOMY    . COLONOSCOPY  2011   cleared for 5 yrs- Dr Allen Norris  . KNEE SURGERY Right    scope  . PORTACATH PLACEMENT Right 09/16/2015   Procedure: INSERTION PORT-A-CATH;  Surgeon: Leonie Green, MD;  Location: [facility] ORS;  Service: General;  Laterality: Right;  . TONSILLECTOMY      There were no vitals filed for this visit.      Subjective Assessment - 09/11/16 1411    Subjective  Pt. reports being tired from moving furniture for having floors installed.   Pertinent History Pt. is a 66 y.o. who was initially diagnosed with CIDP in February of 2013. Pt. has recently experienced weakness in his right hand functioning limiting his ability to complete ADL, and IADL tasks.    Currently in Pain? No/denies                      OT Treatments/Exercises (OP) - 09/11/16 1544      Fine Motor Coordination   Other Fine Motor Exercises Pt. worked on tasks to sustain  lateral pinch on resistive tweezers while grasping and moving 2" toothpick sticks from a horizontal flat position to a vertical position in order to place it in the holder. Pt. was able to sustain grasp while positioning and extending the wrist/hand in the necessary alignment needed to place the stick through the top of the holder. Pt. worked on grasping single wide clothespins of varying sizes with his right hand. Pt. Worked on reaching in various planes with his right UE. Pt. worked on Chief Financial Officer, storing them in his palm, moving them from his palm to the tip of his 2nd digit and thumb, and placing them on a tiny horizontal dowel. Pt. worked on removing them one by one with his 2nd digit, and thumb. Pt. Worked on grasping 1/4" collars. Pt. Worked on [facility] activities while in standing.                OT Education - 09/11/16 1544    Education provided Yes   Education Details [facility]   Person(s) Educated Patient   Methods Explanation;Demonstration   Comprehension Verbalized understanding             OT Long Term Goals - 09/05/16 1356      OT LONG TERM GOAL #1   Title Pt. will increase RUE strength by 2  mm grades to assist with ADL/IADLs   Baseline limited RUE strength   Time 12   Period Weeks   Status On-going     OT LONG TERM GOAL #2   Title Pt. will increase right grip strength by 10# to be able to hold and fill dog bowls.   Baseline Right grip 39#, Left Grip 66#   Time 12   Period Weeks   Status On-going     OT LONG TERM GOAL #3   Title Pt. will improve right hand coordination to be able to independnetly and efficiently turn pages in a magazine   Baseline Pt. has difficulty   Time 12   Period Weeks   Status On-going     OT LONG TERM GOAL #4   Title Pt. will improve right pinch lateral strength by 3# to tbe able to use a key.    Baseline Right; 15#, Left 23#   Time 12   Period Weeks   Status On-going     OT LONG TERM GOAL #5   Title Pt. will improve 3  point pinch to be able to manage zippers.   Baseline right: 9#, Left: 16#    Time 12   Period Weeks   Status On-going     OT LONG TERM GOAL #6   Title Pt. will improve independently write a 3 sentance paragraph efficiently with 100% legibility.   Baseline 50% legibility   Time 12   Period Weeks   Status On-going               Plan - 09/11/16 1528    Clinical Impression Statement Pt. is making excellent progress overall. Pt. reports he is using his right hand for more tasks, and more efficiently now. Pt. worked on Charity fundraiser [facility] skills with UE's in various positions. Pt. continues to work towards improving [facility] skills for use during ADL, and IADL functioning.    Rehab Potential Good   OT Frequency 2x / week   OT Duration 12 weeks   OT Treatment/Interventions Moist Heat;Patient/family education;Therapeutic exercises;Therapeutic activities;Energy conservation;Visual/perceptual remediation/compensation;Neuromuscular education;Passive range of motion;Therapeutic exercise;Self-care/ADL training   Consulted and Agree with Plan of Care Patient      Patient will benefit from skilled therapeutic intervention in order to improve the following deficits and impairments:  Decreased coordination, Decreased strength, Decreased mobility, Decreased balance, Impaired UE functional use, Decreased activity tolerance  Visit Diagnosis: Muscle weakness (generalized)  Other lack of coordination    Problem List Patient Active Problem List   Diagnosis Date Noted  . Hyperlipidemia 12/28/2014  . Adiposity 11/02/2014  . Chronic inflammatory demyelinating polyneuritis ([place]) 09/10/2013  . Diabetes ([place]) 09/10/2013  . Essential hypertension 09/10/2013  . Obstructive apnea 09/10/2013  . Allergic rhinitis, seasonal 09/10/2013    Harrel Carina, MS, OTR/L 09/11/2016, 3:45 PM  [place] MAIN [facility] SERVICES [address] [place], [place], [address] Phone:  [phone]   Fax:  [phone]  Name: FISHER HARGADON MRN: [id number] Date of Birth: [birth year]

## 2016-09-14 ENCOUNTER — Encounter: Payer: Self-pay | Admitting: Occupational Therapy

## 2016-09-14 ENCOUNTER — Ambulatory Visit: Payer: Medicare Other | Admitting: Occupational Therapy

## 2016-09-14 DIAGNOSIS — R278 Other lack of coordination: Secondary | ICD-10-CM

## 2016-09-14 DIAGNOSIS — M6281 Muscle weakness (generalized): Secondary | ICD-10-CM

## 2016-09-14 NOTE — Patient Instructions (Signed)
OT TREATMENT     Neuro muscular re-education:  Pt. worked on using pushpins into a bulletin board with his right hand in sitting, and standing. Pt. worked on the finger ladder in standing with shoulder flexion, and abduction. Pt. Worked on grasping 1/8" extra small beads, and placing them on a tiny dowel.  Therapeutic Exercise:  Pt. worked on the [employer] for 4 min. with constant monitoring of the BUEs. Pt. worked on changing, and alternating forward reverse position every 2 min. Rest breaks were required.  Pt. worked on pinch strengthening in the left hand for lateral, and 3pt. pinch using yellow, red, green, and blue resistive clips. Pt. worked on placing the clips at various vertical and horizontal angles. Tactile and verbal cues were required for eliciting the desired movement.   Selfcare:  Manual Therapy:

## 2016-09-14 NOTE — Therapy (Signed)
[place] MAIN [facility] SERVICES [address] [place], [place], [address] Phone: [phone]   Fax:  [phone]  Occupational Therapy Treatment  Patient Details  Name: Phillip Fitzgerald MRN: [id number] Date of Birth: February 04, 1951 Referring Provider: Dr. Manuella Ghazi  Encounter Date: 09/14/2016      OT End of Session - 09/14/16 1755    Visit Number 19   Number of Visits 24   Date for OT Re-Evaluation 09/25/16   Authorization Type Medicare G code 18   OT Start Time 1300   OT Stop Time 1345   OT Time Calculation (min) 45 min   Activity Tolerance Patient tolerated treatment well   Behavior During Therapy [facility] for tasks assessed/performed      Past Medical History:  Diagnosis Date  . CIDP (chronic inflammatory demyelinating polyneuropathy) ([place])   . CIDP (chronic inflammatory demyelinating polyneuropathy) ([place])   . Diabetes mellitus without complication ([place])   . Elevated lipids   . Hypertension   . Sleep apnea     Past Surgical History:  Procedure Laterality Date  . APPENDECTOMY    . COLONOSCOPY  2011   cleared for 5 yrs- Dr Allen Norris  . KNEE SURGERY Right    scope  . PORTACATH PLACEMENT Right 09/16/2015   Procedure: INSERTION PORT-A-CATH;  Surgeon: Leonie Green, MD;  Location: [facility] ORS;  Service: General;  Laterality: Right;  . TONSILLECTOMY      There were no vitals filed for this visit.      Subjective Assessment - 09/14/16 1339    Subjective  Pt. reports trying to screw, or nails in the wall    Pertinent History Pt. is a 66 y.o. who was initially diagnosed with CIDP in February of 2013. Pt. has recently experienced weakness in his right hand functioning limiting his ability to complete ADL, and IADL tasks.    Patient Stated Goals To be able to use his right hand, and index finger again.   Currently in Pain? No/denies   Pain Score 3                       OT Treatments/Exercises (OP) - 09/14/16 1400      Fine  Motor Coordination   Other Fine Motor Exercises Pt. worked on using pushpins into a bulletin board with his right hand in sitting, and standing. Pt. worked on the finger ladder in standing with shoulder flexion, and abduction. Pt. Worked on grasping 1/8" extra small beads, and placing them on a tiny dowel.     Neurological Re-education Exercises   Other Exercises 1 Pt. worked on the [place] for 4 min. with constant monitoring of the BUEs. Pt. worked on changing, and alternating forward reverse position every 2 min. Rest breaks were required.  Pt. worked on pinch strengthening in the left hand for lateral, and 3pt. pinch using yellow, red, green, and blue resistive clips. Pt. worked on placing the clips at various vertical and horizontal angles. Tactile and verbal cues were required for eliciting the desired movement.                      OT Long Term Goals - 09/05/16 1356      OT LONG TERM GOAL #1   Title Pt. will increase RUE strength by 2 mm grades to assist with ADL/IADLs   Baseline limited RUE strength   Time 12   Period Weeks   Status On-going  OT LONG TERM GOAL #2   Title Pt. will increase right grip strength by 10# to be able to hold and fill dog bowls.   Baseline Right grip 39#, Left Grip 66#   Time 12   Period Weeks   Status On-going     OT LONG TERM GOAL #3   Title Pt. will improve right hand coordination to be able to independnetly and efficiently turn pages in a magazine   Baseline Pt. has difficulty   Time 12   Period Weeks   Status On-going     OT LONG TERM GOAL #4   Title Pt. will improve right pinch lateral strength by 3# to tbe able to use a key.    Baseline Right; 15#, Left 23#   Time 12   Period Weeks   Status On-going     OT LONG TERM GOAL #5   Title Pt. will improve 3 point pinch to be able to manage zippers.   Baseline right: 9#, Left: 16#    Time 12   Period Weeks   Status On-going     OT LONG TERM GOAL #6   Title Pt. will improve  independently write a 3 sentance paragraph efficiently with 100% legibility.   Baseline 50% legibility   Time 12   Period Weeks   Status On-going               Plan - 09/14/16 1756    Clinical Impression Statement Pt. reports he is still doing work at home, as he is having renovations done to his home. Pt. continues to work on improving his right hand [facility] skills  for improved functional use during ADLs, and IADL tasks.   Rehab Potential Good   OT Frequency 2x / week   OT Duration 12 weeks   OT Treatment/Interventions Moist Heat;Patient/family education;Therapeutic exercises;Therapeutic activities;Energy conservation;Visual/perceptual remediation/compensation;Neuromuscular education;Passive range of motion;Therapeutic exercise;Self-care/ADL training   Consulted and Agree with Plan of Care Patient      Patient will benefit from skilled therapeutic intervention in order to improve the following deficits and impairments:     Visit Diagnosis: Muscle weakness (generalized)  Other lack of coordination    Problem List Patient Active Problem List   Diagnosis Date Noted  . Hyperlipidemia 12/28/2014  . Adiposity 11/02/2014  . Chronic inflammatory demyelinating polyneuritis ([place]) 09/10/2013  . Diabetes ([place]) 09/10/2013  . Essential hypertension 09/10/2013  . Obstructive apnea 09/10/2013  . Allergic rhinitis, seasonal 09/10/2013    Harrel Carina, MS, OTR/L 09/14/2016, 6:00 PM  [place] MAIN [facility] SERVICES [address] [place], [place], [address] Phone: [phone]   Fax:  [phone]  Name: Phillip Fitzgerald MRN: [id number] Date of Birth: 1950/05/26

## 2016-09-15 ENCOUNTER — Inpatient Hospital Stay: Payer: Medicare Other | Attending: Oncology

## 2016-09-15 DIAGNOSIS — Z452 Encounter for adjustment and management of vascular access device: Secondary | ICD-10-CM | POA: Insufficient documentation

## 2016-09-15 DIAGNOSIS — G6181 Chronic inflammatory demyelinating polyneuritis: Secondary | ICD-10-CM | POA: Diagnosis not present

## 2016-09-15 DIAGNOSIS — Z95828 Presence of other vascular implants and grafts: Secondary | ICD-10-CM

## 2016-09-15 MED ORDER — HEPARIN SOD (PORK) LOCK FLUSH 100 UNIT/ML IV SOLN
INTRAVENOUS | Status: AC
  Filled 2016-09-15: qty 5

## 2016-09-15 MED ORDER — SODIUM CHLORIDE 0.9% FLUSH
10.0000 mL | INTRAVENOUS | Status: DC | PRN
  Administered 2016-09-15: 10 mL via INTRAVENOUS
  Filled 2016-09-15: qty 10

## 2016-09-15 MED ORDER — HEPARIN SOD (PORK) LOCK FLUSH 100 UNIT/ML IV SOLN
500.0000 [IU] | Freq: Once | INTRAVENOUS | Status: AC
  Administered 2016-09-15: 500 [IU] via INTRAVENOUS

## 2016-09-18 ENCOUNTER — Encounter: Payer: Self-pay | Admitting: Occupational Therapy

## 2016-09-18 ENCOUNTER — Ambulatory Visit: Payer: Medicare Other | Admitting: Occupational Therapy

## 2016-09-18 DIAGNOSIS — M6281 Muscle weakness (generalized): Secondary | ICD-10-CM | POA: Diagnosis not present

## 2016-09-18 DIAGNOSIS — R278 Other lack of coordination: Secondary | ICD-10-CM

## 2016-09-18 NOTE — Therapy (Signed)
[place] MAIN [facility] SERVICES [address] [place], [place], [address] Phone: [phone]   Fax:  [phone]  Occupational Therapy Treatment  Patient Details  Name: Phillip Fitzgerald MRN: [id number] Date of Birth: 1951/04/28 Referring Provider: Dr. Manuella Ghazi  Encounter Date: 09/18/2016      OT End of Session - 09/18/16 1037    Visit Number 20   Number of Visits 24   Date for OT Re-Evaluation 09/25/16   Authorization Type Medicare G code 13   OT Start Time 0092   OT Stop Time 1100   OT Time Calculation (min) 45 min   Activity Tolerance Patient tolerated treatment well   Behavior During Therapy [facility] for tasks assessed/performed      Past Medical History:  Diagnosis Date  . CIDP (chronic inflammatory demyelinating polyneuropathy) ([place])   . CIDP (chronic inflammatory demyelinating polyneuropathy) ([place])   . Diabetes mellitus without complication ([place])   . Elevated lipids   . Hypertension   . Sleep apnea     Past Surgical History:  Procedure Laterality Date  . APPENDECTOMY    . COLONOSCOPY  2011   cleared for 5 yrs- Dr Allen Norris  . KNEE SURGERY Right    scope  . PORTACATH PLACEMENT Right 09/16/2015   Procedure: INSERTION PORT-A-CATH;  Surgeon: Leonie Green, MD;  Location: [facility] ORS;  Service: General;  Laterality: Right;  . TONSILLECTOMY      There were no vitals filed for this visit.      Subjective Assessment - 09/18/16 1034    Subjective  Pt. reports he has had a run through this weekend for an RV he bought.   Pertinent History Pt. is a 66 y.o. who was initially diagnosed with CIDP in February of 2013. Pt. has recently experienced weakness in his right hand functioning limiting his ability to complete ADL, and IADL tasks.    Patient Stated Goals To be able to use his right hand, and index finger again.   Currently in Pain? No/denies                      OT Treatments/Exercises (OP) - 09/18/16 1058      Fine  Motor Coordination   Other Fine Motor Exercises Pt. worked on tasks to sustain right hand lateral pinch on resistive tweezers while grasping and moving 2" toothpick sticks from a horizontal flat position to a vertical position in order to place it in the holder. Pt. was able to sustain grasp while positioning and extending the wrist/hand in the necessary alignment needed to place the stick through the top of the holder Pt. worked on grasping 1/8" mini beads, and placed them on a mini dowel. Pt. worked on grasping single wide clothespins.                OT Education - 09/18/16 1036    Education provided Yes   Education Details [place], UE ther. ex   Person(s) Educated Patient   Methods Explanation;Demonstration   Comprehension Verbalized understanding             OT Long Term Goals - 09/05/16 1356      OT LONG TERM GOAL #1   Title Pt. will increase RUE strength by 2 mm grades to assist with ADL/IADLs   Baseline limited RUE strength   Time 12   Period Weeks   Status On-going     OT LONG TERM GOAL #2   Title Pt. will  increase right grip strength by 10# to be able to hold and fill dog bowls.   Baseline Right grip 39#, Left Grip 66#   Time 12   Period Weeks   Status On-going     OT LONG TERM GOAL #3   Title Pt. will improve right hand coordination to be able to independnetly and efficiently turn pages in a magazine   Baseline Pt. has difficulty   Time 12   Period Weeks   Status On-going     OT LONG TERM GOAL #4   Title Pt. will improve right pinch lateral strength by 3# to tbe able to use a key.    Baseline Right; 15#, Left 23#   Time 12   Period Weeks   Status On-going     OT LONG TERM GOAL #5   Title Pt. will improve 3 point pinch to be able to manage zippers.   Baseline right: 9#, Left: 16#    Time 12   Period Weeks   Status On-going     OT LONG TERM GOAL #6   Title Pt. will improve independently write a 3 sentance paragraph efficiently with 100% legibility.    Baseline 50% legibility   Time 12   Period Weeks   Status On-going               Plan - 09/18/16 1039    Clinical Impression Statement Pt. 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 Pt. continues to work on improving right dominant hand [facility] skills for use during ADLs, and  IADLs.    Rehab Potential Good   OT Frequency 2x / week   OT Duration 12 weeks   OT Treatment/Interventions Moist Heat;Patient/family education;Therapeutic exercises;Therapeutic activities;Energy conservation;Visual/perceptual remediation/compensation;Neuromuscular education;Passive range of motion;Therapeutic exercise;Self-care/ADL training   Consulted and Agree with Plan of Care Patient      Patient will benefit from skilled therapeutic intervention in order to improve the following deficits and impairments:  Decreased coordination, Decreased strength, Decreased mobility, Decreased balance, Impaired UE functional use, Decreased activity tolerance  Visit Diagnosis: Other lack of coordination    Problem List Patient Active Problem List   Diagnosis Date Noted  . Hyperlipidemia 12/28/2014  . Adiposity 11/02/2014  . Chronic inflammatory demyelinating polyneuritis ([place]) 09/10/2013  . Diabetes ([place]) 09/10/2013  . Essential hypertension 09/10/2013  . Obstructive apnea 09/10/2013  . Allergic rhinitis, seasonal 09/10/2013    Harrel Carina, MS, OTR/L 09/18/2016, 11:56 AM  [place] MAIN [facility] SERVICES [address] [place], [place], [address] Phone: [phone]   Fax:  [phone]  Name: Phillip Fitzgerald MRN: [id number] Date of Birth: 05-Dec-1950

## 2016-09-18 NOTE — Patient Instructions (Signed)
OT TREATMENT     Neuro muscular re-education:  Pt. worked on tasks to sustain right hand lateral pinch on resistive tweezers while grasping and moving 2" toothpick sticks from a horizontal flat position to a vertical position in order to place it in the holder. Pt. was able to sustain grasp while positioning and extending the wrist/hand in the necessary alignment needed to place the stick through the top of the holder Pt. worked on grasping 1/8" mini beads, and placed them on a mini dowel. Pt. worked on grasping single wide clothespins.  Therapeutic Exercise:  Selfcare:  Manual Therapy:

## 2016-09-20 ENCOUNTER — Ambulatory Visit: Payer: Medicare Other | Admitting: Occupational Therapy

## 2016-09-20 DIAGNOSIS — R278 Other lack of coordination: Secondary | ICD-10-CM | POA: Diagnosis not present

## 2016-09-20 DIAGNOSIS — M6281 Muscle weakness (generalized): Secondary | ICD-10-CM

## 2016-09-20 NOTE — Therapy (Signed)
[place] MAIN [facility] SERVICES [address] [place], [place], [address] Phone: [phone]   Fax:  [phone]  Occupational Therapy Treatment/Discharge Report  Patient Details  Name: ARDA KEADLE MRN: [id number] Date of Birth: 1950/12/13 Referring Provider: Dr. Manuella Ghazi  Encounter Date: 09/20/2016    Past Medical History:  Diagnosis Date  . CIDP (chronic inflammatory demyelinating polyneuropathy) ([place])   . CIDP (chronic inflammatory demyelinating polyneuropathy) ([place])   . Diabetes mellitus without complication ([place])   . Elevated lipids   . Hypertension   . Sleep apnea     Past Surgical History:  Procedure Laterality Date  . APPENDECTOMY    . COLONOSCOPY  2011   cleared for 5 yrs- Dr Allen Norris  . KNEE SURGERY Right    scope  . PORTACATH PLACEMENT Right 09/16/2015   Procedure: INSERTION PORT-A-CATH;  Surgeon: Leonie Green, MD;  Location: [facility] ORS;  Service: General;  Laterality: Right;  . TONSILLECTOMY      There were no vitals filed for this visit.          [facility] OT Assessment - 09/20/16 0001      Strength   Overall Strength Comments 5/5 overall     Hand Function   Right Hand Grip (lbs) 67   Right Hand Lateral Pinch 17 lbs   Right Hand 3 Point Pinch 8 lbs       OT TREATMENT    Neuro muscular re-education:  Goals were reviewed with pt., and measurements were obtained. Pt. performed [facility] skills training to improve speed and dexterity needed for ADL tasks and writing. Pt. demonstrated grasping 1 inch sticks,  inch cylindrical collars, and  inch flat washers on the [employer] pegboard. Pt. performed grasping, and assembling structures on the [employer] pegboard with bilateral hand coordination.                              OT Long Term Goals - 09/20/16 1148      OT LONG TERM GOAL #1   Title Pt. will increase RUE strength by 2 mm grades to assist with ADL/IADLs   Baseline limited RUE strength   Time 12   Period Weeks   Status On-going     OT LONG TERM GOAL #2   Title Pt. will increase right grip strength by 10# to be able to hold and fill dog bowls.   Baseline Right grip 39#, Left Grip 66#   Time 12   Period Weeks   Status Achieved     OT LONG TERM GOAL #3   Title Pt. will improve right hand coordination to be able to independnetly and efficiently turn pages in a magazine   Baseline Pt. has difficulty   Time 12   Period Weeks   Status Achieved     OT LONG TERM GOAL #4   Title Pt. will improve right pinch lateral strength by 3# to tbe able to use a key.    Baseline Right; 15#, Left 23#   Time 12   Period Weeks   Status Achieved     OT LONG TERM GOAL #5   Title Pt. will improve 3 point pinch to be able to manage zippers.   Baseline right: 9#, Left: 16#    Time 12   Period Weeks   Status Achieved     OT LONG TERM GOAL #6   Title Pt. will improve independently write a 3 sentance  paragraph efficiently with 100% legibility.   Baseline 50% legibility   Time 12   Period Weeks   Status Partially Met               Plan - 09/20/16 1218    Clinical Impression Statement Pt. has made excellent progress overall with his RUE strength, and coordination skills. Pt. has met goals, and is now ready for discharge from OT services.   Rehab Potential Good   OT Frequency 2x / week   OT Duration 12 weeks   OT Treatment/Interventions Moist Heat;Patient/family education;Therapeutic exercises;Therapeutic activities;Energy conservation;Visual/perceptual remediation/compensation;Neuromuscular education;Passive range of motion;Therapeutic exercise;Self-care/ADL training   OT Home Exercise Plan HEP for pink theraputty ex.   Consulted and Agree with Plan of Care Patient      Patient will benefit from skilled therapeutic intervention in order to improve the following deficits and impairments:  Decreased coordination, Decreased strength, Decreased mobility, Decreased balance,  Impaired UE functional use, Decreased activity tolerance  Visit Diagnosis: Muscle weakness (generalized)  Other lack of coordination    Problem List Patient Active Problem List   Diagnosis Date Noted  . Hyperlipidemia 12/28/2014  . Adiposity 11/02/2014  . Chronic inflammatory demyelinating polyneuritis ([place]) 09/10/2013  . Diabetes ([place]) 09/10/2013  . Essential hypertension 09/10/2013  . Obstructive apnea 09/10/2013  . Allergic rhinitis, seasonal 09/10/2013    Harrel Carina, MS, OTR/L 09/20/2016, 12:22 PM  [place] MAIN [facility] SERVICES [address] [place], [place], [address] Phone: [phone]   Fax:  [phone]  Name: KENDALL JUSTO MRN: [id number] Date of Birth: 01-17-1951

## 2016-09-26 ENCOUNTER — Encounter: Payer: Medicare Other | Admitting: Occupational Therapy

## 2016-09-28 ENCOUNTER — Encounter: Payer: Medicare Other | Admitting: Occupational Therapy

## 2016-10-03 ENCOUNTER — Other Ambulatory Visit: Payer: Self-pay

## 2016-10-04 ENCOUNTER — Encounter: Payer: Medicare Other | Admitting: Occupational Therapy

## 2016-10-06 ENCOUNTER — Encounter: Payer: Medicare Other | Admitting: Occupational Therapy

## 2016-10-09 ENCOUNTER — Ambulatory Visit: Payer: Self-pay | Admitting: Oncology

## 2016-10-09 ENCOUNTER — Other Ambulatory Visit: Payer: Medicare Other

## 2016-10-09 ENCOUNTER — Ambulatory Visit: Payer: Medicare Other

## 2016-10-10 ENCOUNTER — Ambulatory Visit: Payer: Medicare Other

## 2016-10-10 ENCOUNTER — Inpatient Hospital Stay: Payer: Medicare Other

## 2016-10-11 ENCOUNTER — Ambulatory Visit: Payer: Medicare Other

## 2016-10-12 ENCOUNTER — Ambulatory Visit: Payer: Medicare Other

## 2016-10-13 ENCOUNTER — Other Ambulatory Visit: Payer: Self-pay | Admitting: Oncology

## 2016-10-13 ENCOUNTER — Telehealth: Payer: Self-pay | Admitting: *Deleted

## 2016-10-13 NOTE — Telephone Encounter (Signed)
Phillip Fitzgerald is coming in next week for IVIG. Needs orders put in [facility].  Thanks.

## 2016-10-16 ENCOUNTER — Inpatient Hospital Stay: Payer: Medicare Other | Attending: Oncology | Admitting: Oncology

## 2016-10-16 ENCOUNTER — Inpatient Hospital Stay: Payer: Medicare Other

## 2016-10-16 ENCOUNTER — Encounter: Payer: Self-pay | Admitting: Oncology

## 2016-10-16 VITALS — BP 189/94 | HR 89 | Temp 97.5°F | Resp 18 | Wt 211.2 lb

## 2016-10-16 VITALS — BP 164/75 | HR 66 | Temp 96.8°F | Resp 18

## 2016-10-16 DIAGNOSIS — Z79899 Other long term (current) drug therapy: Secondary | ICD-10-CM

## 2016-10-16 DIAGNOSIS — Z87891 Personal history of nicotine dependence: Secondary | ICD-10-CM

## 2016-10-16 DIAGNOSIS — G4730 Sleep apnea, unspecified: Secondary | ICD-10-CM

## 2016-10-16 DIAGNOSIS — I10 Essential (primary) hypertension: Secondary | ICD-10-CM

## 2016-10-16 DIAGNOSIS — Z880 Allergy status to penicillin: Secondary | ICD-10-CM

## 2016-10-16 DIAGNOSIS — G6181 Chronic inflammatory demyelinating polyneuritis: Secondary | ICD-10-CM | POA: Diagnosis not present

## 2016-10-16 DIAGNOSIS — E119 Type 2 diabetes mellitus without complications: Secondary | ICD-10-CM

## 2016-10-16 DIAGNOSIS — Z794 Long term (current) use of insulin: Secondary | ICD-10-CM | POA: Diagnosis not present

## 2016-10-16 LAB — CBC WITH DIFFERENTIAL/PLATELET
BASOS ABS: 0 10*3/uL (ref 0–0.1)
Basophils Relative: 1 %
EOS PCT: 3 %
Eosinophils Absolute: 0.2 10*3/uL (ref 0–0.7)
HCT: 39.8 % — ABNORMAL LOW (ref 40.0–52.0)
HEMOGLOBIN: 13.5 g/dL (ref 13.0–18.0)
LYMPHS ABS: 2 10*3/uL (ref 1.0–3.6)
LYMPHS PCT: 25 %
MCH: 33.4 pg (ref 26.0–34.0)
MCHC: 34 g/dL (ref 32.0–36.0)
MCV: 98.3 fL (ref 80.0–100.0)
Monocytes Absolute: 0.7 10*3/uL (ref 0.2–1.0)
Monocytes Relative: 9 %
NEUTROS ABS: 4.8 10*3/uL (ref 1.4–6.5)
NEUTROS PCT: 62 %
PLATELETS: 146 10*3/uL — AB (ref 150–440)
RBC: 4.05 MIL/uL — AB (ref 4.40–5.90)
RDW: 12.3 % (ref 11.5–14.5)
WBC: 7.7 10*3/uL (ref 3.8–10.6)

## 2016-10-16 LAB — COMPREHENSIVE METABOLIC PANEL
ALK PHOS: 63 U/L (ref 38–126)
ALT: 27 U/L (ref 17–63)
AST: 36 U/L (ref 15–41)
Albumin: 4.1 g/dL (ref 3.5–5.0)
Anion gap: 14 (ref 5–15)
BUN: 21 mg/dL — AB (ref 6–20)
CALCIUM: 9 mg/dL (ref 8.9–10.3)
CHLORIDE: 89 mmol/L — AB (ref 101–111)
CO2: 30 mmol/L (ref 22–32)
CREATININE: 1.55 mg/dL — AB (ref 0.61–1.24)
GFR calc Af Amer: 52 mL/min — ABNORMAL LOW (ref >60)
GFR, EST NON AFRICAN AMERICAN: 45 mL/min — AB (ref >60)
Glucose, Bld: 438 mg/dL — ABNORMAL HIGH (ref 65–99)
Potassium: 3.8 mmol/L (ref 3.5–5.1)
Sodium: 133 mmol/L — ABNORMAL LOW (ref 135–145)
Total Bilirubin: 0.5 mg/dL (ref 0.3–1.2)
Total Protein: 7.9 g/dL (ref 6.5–8.1)

## 2016-10-16 MED ORDER — SODIUM CHLORIDE 0.9% FLUSH
3.0000 mL | Freq: Once | INTRAVENOUS | Status: DC | PRN
  Filled 2016-10-16: qty 3

## 2016-10-16 MED ORDER — HEPARIN SOD (PORK) LOCK FLUSH 100 UNIT/ML IV SOLN
500.0000 [IU] | Freq: Once | INTRAVENOUS | Status: AC | PRN
  Administered 2016-10-16: 500 [IU]

## 2016-10-16 MED ORDER — SODIUM CHLORIDE 0.9% FLUSH
10.0000 mL | INTRAVENOUS | Status: DC | PRN
  Administered 2016-10-16: 10 mL
  Filled 2016-10-16: qty 10

## 2016-10-16 MED ORDER — ACETAMINOPHEN 325 MG PO TABS
650.0000 mg | ORAL_TABLET | Freq: Once | ORAL | Status: AC
  Administered 2016-10-16: 650 mg via ORAL
  Filled 2016-10-16: qty 2

## 2016-10-16 MED ORDER — IMMUNE GLOBULIN (HUMAN) 10 GM/100ML IV SOLN
50.0000 g | Freq: Once | INTRAVENOUS | Status: AC
Start: 2016-10-16 — End: 2016-10-16
  Administered 2016-10-16: 50 g via INTRAVENOUS
  Filled 2016-10-16: qty 100

## 2016-10-16 MED ORDER — SODIUM CHLORIDE 0.9% FLUSH
10.0000 mL | INTRAVENOUS | Status: DC | PRN
Start: 2016-10-16 — End: 2016-10-16
  Filled 2016-10-16: qty 10

## 2016-10-16 MED ORDER — ALTEPLASE 2 MG IJ SOLR: 2.0000 mg | Freq: Once | INTRAMUSCULAR | Status: DC | PRN

## 2016-10-16 MED ORDER — HEPARIN SOD (PORK) LOCK FLUSH 100 UNIT/ML IV SOLN: 250.0000 [IU] | Freq: Once | INTRAVENOUS | Status: DC | PRN

## 2016-10-16 MED ORDER — DEXAMETHASONE SODIUM PHOSPHATE 10 MG/ML IJ SOLN: 10.0000 mg | Freq: Once | INTRAMUSCULAR | Status: DC

## 2016-10-16 MED ORDER — FAMOTIDINE IN NACL 20-0.9 MG/50ML-% IV SOLN: 20.0000 mg | Freq: Once | INTRAVENOUS | Status: DC

## 2016-10-16 MED ORDER — SODIUM CHLORIDE 0.9% FLUSH
10.0000 mL | INTRAVENOUS | Status: DC | PRN
  Filled 2016-10-16: qty 10

## 2016-10-16 MED ORDER — SODIUM CHLORIDE 0.9 % IV SOLN
Freq: Once | INTRAVENOUS | Status: AC
  Administered 2016-10-16: 09:00:00 via INTRAVENOUS
  Filled 2016-10-16: qty 1000

## 2016-10-16 MED ORDER — SODIUM CHLORIDE 0.9 % IV SOLN: 10.0000 mg | Freq: Once | INTRAVENOUS | Status: DC

## 2016-10-16 MED ORDER — HEPARIN SOD (PORK) LOCK FLUSH 100 UNIT/ML IV SOLN
500.0000 [IU] | Freq: Once | INTRAVENOUS | Status: DC | PRN
  Filled 2016-10-16: qty 5

## 2016-10-16 MED ORDER — HEPARIN SOD (PORK) LOCK FLUSH 100 UNIT/ML IV SOLN: 500.0000 [IU] | Freq: Once | INTRAVENOUS | Status: DC | PRN

## 2016-10-16 MED ORDER — DIPHENHYDRAMINE HCL 25 MG PO TABS
25.0000 mg | ORAL_TABLET | Freq: Once | ORAL | Status: AC
  Administered 2016-10-16: 25 mg via ORAL
  Filled 2016-10-16: qty 1

## 2016-10-16 NOTE — Progress Notes (Signed)
Here for f/u. Stated he saw an OT for therapy for right hand-3 fingers. No longer going. Was effective per pt  bp recheck @0919  was 145/75, p-85

## 2016-10-16 NOTE — Progress Notes (Signed)
Hematology/Oncology Consult note [facility]  Telephone:([phone] [phone]  Patient Care Team: Juline Patch, MD as PCP - General (Family Medicine)   Name of the patient: Phillip Fitzgerald  [id number]  12-Aug-1950   Date of visit: 10/16/16  Diagnosis- IVIG for CIDP  Chief complaint/ Reason for visit- routine f/u for cycle 2 of IVIG  Heme/Onc history: patient is a 66 year old male who was diagnosed with chronic inflammation in demyelinating polyneuropathy (CIDP) in 2012. Dr. Jennings Books is his outpatient neurologist and he has been getting IVIG infusion at home 2 mg/kg over 4 days every 3 months maintenance regimen. His last dose was in October 2017 and he was due for a repeat dose in January 2018. However insurance denied home infusions and he is required to receive infusions at the infusion center and hence has been referred to [place] for the same. Patient's last visit with Dr. Manuella Ghazi was in July 2017. Patient states that he has been tolerating his IVIG well without any significant side effects. At one point in the past he did have a rash in his palms as well as developed a headache when it was given too fast. He has not had any side effects recently. He has responded well to IVIG and is now able to walk with a cane. When he was initially diagnosed in 2012 he was unable to lift his whole leg up and also had difficulty with his hands.[facility] EMG in April 2012 showed abnormal electrodiagnostic study consistent with generalized severe acquired and sensory motor polyneuropathy with neuropathic changes on the legs and all limbs. Lumbar puncture showed mildly basic protein was 0.6 normal IgG synthesis rate was -8.8 normal he has 0 on oligoclonal bands in his CSF. He was also seen at [facility] for second opinion and diagnosis was confirmed. He does have a port in place to receive IVIG.  Interval history- no issues with IVIG so far. He has received it for 5 years now. Strength in  his extremities is improving    Review of systems- Review of Systems  Constitutional: Negative for chills, fever, malaise/fatigue and weight loss.  HENT: Negative for congestion, ear discharge and nosebleeds.   Eyes: Negative for blurred vision.  Respiratory: Negative for cough, hemoptysis, sputum production, shortness of breath and wheezing.   Cardiovascular: Negative for chest pain, palpitations, orthopnea and claudication.  Gastrointestinal: Negative for abdominal pain, blood in stool, constipation, diarrhea, heartburn, melena, nausea and vomiting.  Genitourinary: Negative for dysuria, flank pain, frequency, hematuria and urgency.  Musculoskeletal: Negative for back pain, joint pain and myalgias.  Skin: Negative for rash.  Neurological: Negative for dizziness, tingling, focal weakness, seizures, weakness and headaches.  Endo/Heme/Allergies: Does not bruise/bleed easily.  Psychiatric/Behavioral: Negative for depression and suicidal ideas. The patient does not have insomnia.        Allergies  Allergen Reactions  . Penicillins Other (See Comments)    Unknown reaction from chilhood     Past Medical History:  Diagnosis Date  . CIDP (chronic inflammatory demyelinating polyneuropathy) ([place])   . CIDP (chronic inflammatory demyelinating polyneuropathy) ([place])   . Diabetes mellitus without complication ([place])   . Elevated lipids   . Hypertension   . Sleep apnea      Past Surgical History:  Procedure Laterality Date  . APPENDECTOMY    . COLONOSCOPY  2011   cleared for 5 yrs- Dr Allen Norris  . KNEE SURGERY Right    scope  . PORTACATH PLACEMENT Right 09/16/2015  Procedure: INSERTION PORT-A-CATH;  Surgeon: Leonie Green, MD;  Location: [facility] ORS;  Service: General;  Laterality: Right;  . TONSILLECTOMY      Social History   Social History  . Marital status: Married    Spouse name: N/A  . Number of children: N/A  . Years of education: N/A   Occupational History  . Not on  file.   Social History Main Topics  . Smoking status: Former Smoker    Quit date: 05/08/1978  . Smokeless tobacco: Never Used  . Alcohol use 1.8 oz/week    3 Cans of beer per week     Comment: none since tuesday 5/9, usually a glass of wine or 2 at dinner daily  . Drug use: No  . Sexual activity: Yes   Other Topics Concern  . Not on file   Social History Narrative  . No narrative on file    Family History  Problem Relation Age of Onset  . Diabetes Father   . Heart disease Father   . Angina Mother   . Macular degeneration Mother   . Lung cancer Sister      Current Outpatient Prescriptions:  .  amLODipine (NORVASC) 10 MG tablet, TAKE 1 TABLET BY MOUTH DAILY, Disp: 90 tablet, Rfl: 0 .  carvedilol (COREG) 12.5 MG tablet, Take 2 tablets (25 mg total) by mouth 2 (two) times daily with a meal. TAKE 1 TABLET (12.5 MG TOTAL) BY MOUTH 2 (TWO) TIMES DAILY WITH A MEAL., Disp: 60 tablet, Rfl: 6 .  Immune Globulin 10% (GAMUNEX-C) 10 GM/100ML SOLN, Inject into the vein every 3 (three) months. 50 grams- Dr Manuella Ghazi, Disp: , Rfl:  .  Insulin Glargine (LANTUS SOLOSTAR) 100 UNIT/ML Solostar Pen, INJECT 46 UNITS UNDER THE SKIN AT BEDTIME- Dr Maretta Bees, Disp: 15 mL, Rfl: 4 .  insulin lispro (HUMALOG) 100 UNIT/ML injection, Inject into the skin 3 (three) times daily before meals. 6-12 units with meals- Dr Maretta Bees, Disp: , Rfl:  .  loratadine (CLARITIN) 10 MG tablet, Take 1 tablet (10 mg total) by mouth daily., Disp: 30 tablet, Rfl: 11 .  metFORMIN (GLUCOPHAGE) 500 MG tablet, Take 1 tablet (500 mg total) by mouth 2 (two) times daily., Disp: 60 tablet, Rfl: 6 .  rosuvastatin (CRESTOR) 20 MG tablet, Take 1 tablet (20 mg total) by mouth at bedtime. Dr Maretta Bees, Disp: 30 tablet, Rfl: 6 .  telmisartan-hydrochlorothiazide (MICARDIS HCT) 80-25 MG tablet, Take 1 tablet by mouth daily., Disp: 30 tablet, Rfl: 6 .  venlafaxine (EFFEXOR) 75 MG tablet, Take 1 tablet by mouth 2 (two) times daily. Dr Manuella Ghazi, Disp: , Rfl:   Physical  exam:  Vitals:   10/16/16 0857  BP: (!) 189/94  Pulse: 89  Resp: 18  Temp: 97.5 F (36.4 C)  TempSrc: Tympanic  Weight: 211 lb 3.2 oz (95.8 kg)   Physical Exam  Constitutional: He is oriented to person, place, and time and well-developed, well-nourished, and in no distress.  HENT:  Head: Normocephalic and atraumatic.  Eyes: EOM are normal. Pupils are equal, round, and reactive to light.  Neck: Normal range of motion.  Cardiovascular: Normal rate, regular rhythm and normal heart sounds.   Pulmonary/Chest: Effort normal and breath sounds normal.  Abdominal: Soft. Bowel sounds are normal.  Neurological: He is alert and oriented to person, place, and time.  Skin: Skin is warm and dry.     CMP Latest Ref Rng & Units 06/22/2016  Glucose 65 - 99 mg/dL -  BUN 6 -  20 mg/dL -  Creatinine 0.61 - 1.24 mg/dL 1.39(H)  Sodium 135 - 145 mmol/L -  Potassium 3.5 - 5.1 mmol/L -  Chloride 101 - 111 mmol/L -  CO2 22 - 32 mmol/L -  Calcium 8.9 - 10.3 mg/dL -  Total Protein 6.5 - 8.1 g/dL -  Total Bilirubin 0.3 - 1.2 mg/dL -  Alkaline Phos 38 - 126 U/L -  AST 15 - 41 U/L -  ALT 17 - 63 U/L -   CBC Latest Ref Rng & Units 06/19/2016  WBC 3.8 - 10.6 K/uL 8.9  Hemoglobin 13.0 - 18.0 g/dL 13.9  Hematocrit 40.0 - 52.0 % 40.6  Platelets 150 - 440 K/uL 166      Assessment and plan- Patient is a 66 y.o. male with a h/o CIDP diagnosed in 2012 and is on maintenance IVIG every 3 months  Patient has received IVIG in the past and curerntly getting his infusions through [place]. His 1st cycle of IVIG through [place] was in feb 2018. This is his cycle 2. He will be getting 50 gm daily for 4 days. He has tolerated IVIG well in the past without any complaints. He will proceed with cycle 2 starting today and I will see him back for his next cycle of IVIG  HTN- will repeat it again today. He needs to discuss this with his PCP. BP in our office has always been high. Repeat BP check in the office showed SBP in  140's  RTC in 3 months for next ccyle of IVIG with labs   Visit Diagnosis 1. Chronic inflammatory demyelinating polyneuritis (HCC)   2. Long-term current use of intravenous immunoglobulin (IVIG)      Dr. Randa Evens, MD, MPH [place] [facility] Pager- [phone] 10/16/2016  9:18 AM

## 2016-10-17 ENCOUNTER — Other Ambulatory Visit: Payer: Self-pay | Admitting: Oncology

## 2016-10-17 ENCOUNTER — Inpatient Hospital Stay: Payer: Medicare Other

## 2016-10-17 VITALS — BP 144/82 | HR 62 | Temp 97.3°F | Resp 20

## 2016-10-17 DIAGNOSIS — G6181 Chronic inflammatory demyelinating polyneuritis: Secondary | ICD-10-CM

## 2016-10-17 DIAGNOSIS — G4730 Sleep apnea, unspecified: Secondary | ICD-10-CM | POA: Diagnosis not present

## 2016-10-17 DIAGNOSIS — Z794 Long term (current) use of insulin: Secondary | ICD-10-CM | POA: Diagnosis not present

## 2016-10-17 DIAGNOSIS — Z79899 Other long term (current) drug therapy: Secondary | ICD-10-CM | POA: Diagnosis not present

## 2016-10-17 DIAGNOSIS — E119 Type 2 diabetes mellitus without complications: Secondary | ICD-10-CM | POA: Diagnosis not present

## 2016-10-17 DIAGNOSIS — I10 Essential (primary) hypertension: Secondary | ICD-10-CM | POA: Diagnosis not present

## 2016-10-17 MED ORDER — HEPARIN SOD (PORK) LOCK FLUSH 100 UNIT/ML IV SOLN
500.0000 [IU] | Freq: Once | INTRAVENOUS | Status: AC | PRN
  Administered 2016-10-17: 500 [IU]
  Filled 2016-10-17: qty 5

## 2016-10-17 MED ORDER — SODIUM CHLORIDE 0.9 % IV SOLN
Freq: Once | INTRAVENOUS | Status: AC
  Administered 2016-10-17: 10:00:00 via INTRAVENOUS
  Filled 2016-10-17: qty 1000

## 2016-10-17 MED ORDER — DIPHENHYDRAMINE HCL 25 MG PO TABS: 25.0000 mg | ORAL_TABLET | Freq: Once | ORAL | Status: DC

## 2016-10-17 MED ORDER — DIPHENHYDRAMINE HCL 25 MG PO CAPS
25.0000 mg | ORAL_CAPSULE | Freq: Once | ORAL | Status: AC
  Administered 2016-10-17: 25 mg via ORAL
  Filled 2016-10-17: qty 1

## 2016-10-17 MED ORDER — SODIUM CHLORIDE 0.9% FLUSH
10.0000 mL | INTRAVENOUS | Status: DC | PRN
  Filled 2016-10-17: qty 10

## 2016-10-17 MED ORDER — IMMUNE GLOBULIN (HUMAN) 20 GM/200ML IV SOLN
50.0000 g | INTRAVENOUS | Status: DC
  Administered 2016-10-17: 10:00:00 50 g via INTRAVENOUS
  Filled 2016-10-17: qty 100

## 2016-10-17 MED ORDER — ACETAMINOPHEN 325 MG PO TABS
650.0000 mg | ORAL_TABLET | Freq: Once | ORAL | Status: AC
  Administered 2016-10-17: 650 mg via ORAL
  Filled 2016-10-17: qty 2

## 2016-10-18 ENCOUNTER — Inpatient Hospital Stay: Payer: Medicare Other

## 2016-10-18 VITALS — BP 136/77 | HR 82 | Temp 97.4°F | Resp 19

## 2016-10-18 DIAGNOSIS — Z79899 Other long term (current) drug therapy: Secondary | ICD-10-CM | POA: Diagnosis not present

## 2016-10-18 DIAGNOSIS — G4730 Sleep apnea, unspecified: Secondary | ICD-10-CM | POA: Diagnosis not present

## 2016-10-18 DIAGNOSIS — Z794 Long term (current) use of insulin: Secondary | ICD-10-CM | POA: Diagnosis not present

## 2016-10-18 DIAGNOSIS — G6181 Chronic inflammatory demyelinating polyneuritis: Secondary | ICD-10-CM

## 2016-10-18 DIAGNOSIS — E119 Type 2 diabetes mellitus without complications: Secondary | ICD-10-CM | POA: Diagnosis not present

## 2016-10-18 DIAGNOSIS — I10 Essential (primary) hypertension: Secondary | ICD-10-CM | POA: Diagnosis not present

## 2016-10-18 MED ORDER — SODIUM CHLORIDE 0.9 % IV SOLN
Freq: Once | INTRAVENOUS | Status: AC
  Administered 2016-10-18: 09:00:00 via INTRAVENOUS
  Filled 2016-10-18: qty 1000

## 2016-10-18 MED ORDER — SODIUM CHLORIDE 0.9% FLUSH
3.0000 mL | Freq: Once | INTRAVENOUS | Status: DC | PRN
  Filled 2016-10-18: qty 3

## 2016-10-18 MED ORDER — HEPARIN SOD (PORK) LOCK FLUSH 100 UNIT/ML IV SOLN
250.0000 [IU] | Freq: Once | INTRAVENOUS | Status: AC | PRN
  Administered 2016-10-18: 250 [IU]
  Filled 2016-10-18 (×2): qty 5

## 2016-10-18 MED ORDER — ACETAMINOPHEN 325 MG PO TABS
650.0000 mg | ORAL_TABLET | Freq: Once | ORAL | Status: AC
  Administered 2016-10-18: 650 mg via ORAL
  Filled 2016-10-18: qty 2

## 2016-10-18 MED ORDER — DIPHENHYDRAMINE HCL 25 MG PO TABS
25.0000 mg | ORAL_TABLET | Freq: Once | ORAL | Status: AC
  Administered 2016-10-18: 25 mg via ORAL
  Filled 2016-10-18: qty 1

## 2016-10-18 MED ORDER — IMMUNE GLOBULIN (HUMAN) 20 GM/200ML IV SOLN
50.0000 g | Freq: Once | INTRAVENOUS | Status: AC
  Administered 2016-10-18: 50 g via INTRAVENOUS
  Filled 2016-10-18: qty 100

## 2016-10-18 MED ORDER — DIPHENHYDRAMINE HCL 25 MG PO CAPS
ORAL_CAPSULE | ORAL | Status: AC
  Filled 2016-10-18: qty 1

## 2016-10-19 ENCOUNTER — Inpatient Hospital Stay: Payer: Medicare Other

## 2016-10-19 VITALS — BP 107/68 | HR 75 | Temp 98.8°F | Resp 18

## 2016-10-19 DIAGNOSIS — Z79899 Other long term (current) drug therapy: Secondary | ICD-10-CM | POA: Diagnosis not present

## 2016-10-19 DIAGNOSIS — G4730 Sleep apnea, unspecified: Secondary | ICD-10-CM | POA: Diagnosis not present

## 2016-10-19 DIAGNOSIS — I10 Essential (primary) hypertension: Secondary | ICD-10-CM | POA: Diagnosis not present

## 2016-10-19 DIAGNOSIS — Z794 Long term (current) use of insulin: Secondary | ICD-10-CM | POA: Diagnosis not present

## 2016-10-19 DIAGNOSIS — G6181 Chronic inflammatory demyelinating polyneuritis: Secondary | ICD-10-CM | POA: Diagnosis not present

## 2016-10-19 DIAGNOSIS — E119 Type 2 diabetes mellitus without complications: Secondary | ICD-10-CM | POA: Diagnosis not present

## 2016-10-19 MED ORDER — ACETAMINOPHEN 325 MG PO TABS
650.0000 mg | ORAL_TABLET | Freq: Once | ORAL | Status: AC
  Administered 2016-10-19: 650 mg via ORAL

## 2016-10-19 MED ORDER — IMMUNE GLOBULIN (HUMAN) 20 GM/200ML IV SOLN
50.0000 g | INTRAVENOUS | Status: DC
  Administered 2016-10-19: 10:00:00 50 g via INTRAVENOUS
  Filled 2016-10-19: qty 100

## 2016-10-19 MED ORDER — HEPARIN SOD (PORK) LOCK FLUSH 100 UNIT/ML IV SOLN
500.0000 [IU] | Freq: Once | INTRAVENOUS | Status: AC
  Administered 2016-10-19: 500 [IU] via INTRAVENOUS
  Filled 2016-10-19: qty 5

## 2016-10-19 MED ORDER — DIPHENHYDRAMINE HCL 25 MG PO CAPS
25.0000 mg | ORAL_CAPSULE | Freq: Once | ORAL | Status: AC
  Administered 2016-10-19: 25 mg via ORAL

## 2016-10-19 MED ORDER — DEXTROSE 5 % IV SOLN
Freq: Once | INTRAVENOUS | Status: AC
  Administered 2016-10-19: 10:00:00 via INTRAVENOUS
  Filled 2016-10-19: qty 1000

## 2016-10-19 MED ORDER — SODIUM CHLORIDE 0.9 % IV SOLN
INTRAVENOUS | Status: DC
  Administered 2016-10-19: 09:00:00 via INTRAVENOUS
  Filled 2016-10-19: qty 1000

## 2016-10-19 MED ORDER — SODIUM CHLORIDE 0.9% FLUSH
10.0000 mL | INTRAVENOUS | Status: DC | PRN
  Administered 2016-10-19: 10 mL via INTRAVENOUS
  Filled 2016-10-19: qty 10

## 2016-11-30 ENCOUNTER — Inpatient Hospital Stay: Payer: Medicare Other | Attending: Oncology

## 2016-11-30 DIAGNOSIS — Z452 Encounter for adjustment and management of vascular access device: Secondary | ICD-10-CM | POA: Diagnosis not present

## 2016-11-30 DIAGNOSIS — Z95828 Presence of other vascular implants and grafts: Secondary | ICD-10-CM

## 2016-11-30 DIAGNOSIS — G6181 Chronic inflammatory demyelinating polyneuritis: Secondary | ICD-10-CM | POA: Insufficient documentation

## 2016-11-30 MED ORDER — HEPARIN SOD (PORK) LOCK FLUSH 100 UNIT/ML IV SOLN
500.0000 [IU] | Freq: Once | INTRAVENOUS | Status: AC
  Administered 2016-11-30: 500 [IU] via INTRAVENOUS

## 2016-11-30 MED ORDER — SODIUM CHLORIDE 0.9% FLUSH
10.0000 mL | INTRAVENOUS | Status: DC | PRN
  Administered 2016-11-30: 10 mL via INTRAVENOUS
  Filled 2016-11-30: qty 10

## 2016-12-07 ENCOUNTER — Encounter: Payer: Self-pay | Admitting: Family Medicine

## 2016-12-07 ENCOUNTER — Ambulatory Visit (INDEPENDENT_AMBULATORY_CARE_PROVIDER_SITE_OTHER): Payer: Medicare Other | Admitting: Family Medicine

## 2016-12-07 VITALS — BP 140/98 | HR 64 | Ht 74.0 in | Wt 206.0 lb

## 2016-12-07 DIAGNOSIS — I10 Essential (primary) hypertension: Secondary | ICD-10-CM

## 2016-12-07 DIAGNOSIS — E119 Type 2 diabetes mellitus without complications: Secondary | ICD-10-CM

## 2016-12-07 DIAGNOSIS — Z794 Long term (current) use of insulin: Secondary | ICD-10-CM | POA: Diagnosis not present

## 2016-12-07 DIAGNOSIS — E782 Mixed hyperlipidemia: Secondary | ICD-10-CM

## 2016-12-07 DIAGNOSIS — F331 Major depressive disorder, recurrent, moderate: Secondary | ICD-10-CM | POA: Diagnosis not present

## 2016-12-07 MED ORDER — TELMISARTAN-HCTZ 80-25 MG PO TABS: 1.0000 | ORAL_TABLET | Freq: Every day | ORAL | 6 refills | Status: DC

## 2016-12-07 MED ORDER — VENLAFAXINE HCL 100 MG PO TABS: 100.0000 mg | ORAL_TABLET | Freq: Two times a day (BID) | ORAL | 6 refills | Status: DC

## 2016-12-07 MED ORDER — CARVEDILOL 12.5 MG PO TABS: 25.0000 mg | ORAL_TABLET | Freq: Two times a day (BID) | ORAL | 6 refills | Status: DC

## 2016-12-07 MED ORDER — AMLODIPINE BESYLATE 10 MG PO TABS: 10.0000 mg | ORAL_TABLET | Freq: Every day | ORAL | 1 refills | Status: DC

## 2016-12-07 MED ORDER — ROSUVASTATIN CALCIUM 20 MG PO TABS: 20.0000 mg | ORAL_TABLET | Freq: Every day | ORAL | 6 refills | Status: DC

## 2016-12-07 MED ORDER — INSULIN PEN NEEDLE 31G X 8 MM MISC: 6 refills | Status: AC

## 2016-12-07 MED ORDER — INSULIN LISPRO 100 UNIT/ML ~~LOC~~ SOLN: 6.0000 [IU] | Freq: Three times a day (TID) | SUBCUTANEOUS | 6 refills | Status: DC

## 2016-12-07 MED ORDER — INSULIN GLARGINE 100 UNIT/ML SOLOSTAR PEN: PEN_INJECTOR | SUBCUTANEOUS | 6 refills | Status: DC

## 2016-12-07 MED ORDER — METFORMIN HCL 500 MG PO TABS: 500.0000 mg | ORAL_TABLET | Freq: Two times a day (BID) | ORAL | 6 refills | Status: AC

## 2016-12-07 NOTE — Progress Notes (Signed)
Name: Phillip Fitzgerald   MRN: [id number]    DOB: [birth year]   Date:12/07/2016       Progress Note  Subjective  Chief Complaint  Chief Complaint  Patient presents with  . Hyperlipidemia  . Hypertension    off micardis x 2 days  . Diabetes    Dr Maretta Bees left and he's been off Lantus x 2 days  . Allergic Reaction    Hyperlipidemia  This is a chronic problem. The current episode started more than 1 year ago. The problem is controlled. Recent lipid tests were reviewed and are normal. Exacerbating diseases include diabetes and obesity. He has no history of chronic renal disease, hypothyroidism, liver disease or nephrotic syndrome. Factors aggravating his hyperlipidemia include beta blockers and thiazides. Pertinent negatives include no chest pain, focal sensory loss, focal weakness, leg pain, myalgias or shortness of breath. Current antihyperlipidemic treatment includes statins. The current treatment provides no improvement of lipids. There are no compliance problems.  Risk factors for coronary artery disease include diabetes mellitus, hypertension, obesity and male sex.  Hypertension  This is a chronic problem. The current episode started more than 1 year ago. The problem has been waxing and waning since onset. The problem is controlled. Pertinent negatives include no anxiety, blurred vision, chest pain, headaches, malaise/fatigue, neck pain, orthopnea, palpitations, peripheral edema, PND, shortness of breath or sweats. Risk factors for coronary artery disease include diabetes mellitus and dyslipidemia. Past treatments include angiotensin blockers, calcium channel blockers, diuretics and beta blockers. The current treatment provides moderate improvement. There are no compliance problems.  There is no history of angina, kidney disease, CAD/MI, CVA, heart failure, left ventricular hypertrophy, PVD or retinopathy. There is no history of chronic renal disease, a hypertension causing med or renovascular  disease.  Diabetes  He presents for his follow-up diabetic visit. He has type 2 diabetes mellitus. His disease course has been stable. Pertinent negatives for hypoglycemia include no confusion, dizziness, headaches, hunger, mood changes, nervousness/anxiousness, pallor, seizures, sleepiness, speech difficulty, sweats or tremors. Pertinent negatives for diabetes include no blurred vision, no chest pain, no foot paresthesias, no foot ulcerations, no polydipsia, no polyphagia, no polyuria and no weight loss. Symptoms are stable. Pertinent negatives for diabetic complications include no CVA, PVD or retinopathy. Current diabetic treatment includes insulin injections.  Allergic Reaction  Pertinent negatives include no abdominal pain, chest pain, coughing, diarrhea, rash or wheezing.    No problem-specific Assessment & Plan notes found for this encounter.   Past Medical History:  Diagnosis Date  . CIDP (chronic inflammatory demyelinating polyneuropathy) ([place])   . CIDP (chronic inflammatory demyelinating polyneuropathy) ([place])   . Diabetes mellitus without complication ([place])   . Elevated lipids   . Hypertension   . Sleep apnea     Past Surgical History:  Procedure Laterality Date  . APPENDECTOMY    . COLONOSCOPY  2011   cleared for 5 yrs- Dr Allen Norris  . KNEE SURGERY Right    scope  . PORTACATH PLACEMENT Right 09/16/2015   Procedure: INSERTION PORT-A-CATH;  Surgeon: Leonie Green, MD;  Location: [facility] ORS;  Service: General;  Laterality: Right;  . TONSILLECTOMY      Family History  Problem Relation Age of Onset  . Diabetes Father   . Heart disease Father   . Angina Mother   . Macular degeneration Mother   . Lung cancer Sister     Social History   Social History  . Marital status: Married    Spouse name: N/A  .  Number of children: N/A  . Years of education: N/A   Occupational History  . Not on file.   Social History Main Topics  . Smoking status: Former Smoker    Quit date:  05/08/1978  . Smokeless tobacco: Never Used  . Alcohol use 1.8 oz/week    3 Cans of beer per week     Comment: none since tuesday 5/9, usually a glass of wine or 2 at dinner daily  . Drug use: No  . Sexual activity: Yes   Other Topics Concern  . Not on file   Social History Narrative  . No narrative on file    Allergies  Allergen Reactions  . Penicillins Other (See Comments)    Unknown reaction from chilhood    Outpatient Medications Prior to Visit  Medication Sig Dispense Refill  . Immune Globulin 10% (GAMUNEX-C) 10 GM/100ML SOLN Inject into the vein every 4 (four) months. 50 grams- Dr Manuella Ghazi    . loratadine (CLARITIN) 10 MG tablet Take 1 tablet (10 mg total) by mouth daily. 30 tablet 11  . amLODipine (NORVASC) 10 MG tablet TAKE 1 TABLET BY MOUTH DAILY 90 tablet 0  . carvedilol (COREG) 12.5 MG tablet Take 2 tablets (25 mg total) by mouth 2 (two) times daily with a meal. TAKE 1 TABLET (12.5 MG TOTAL) BY MOUTH 2 (TWO) TIMES DAILY WITH A MEAL. 60 tablet 6  . Insulin Glargine (LANTUS SOLOSTAR) 100 UNIT/ML Solostar Pen INJECT 46 UNITS UNDER THE SKIN AT BEDTIME- Dr Maretta Bees 15 mL 4  . insulin lispro (HUMALOG) 100 UNIT/ML injection Inject into the skin 3 (three) times daily before meals. 6-12 units with meals- Dr Maretta Bees    . Insulin Pen Needle (FIFTY50 PEN NEEDLES) 31G X 8 MM MISC Use 4 (four) times daily. DX: E11.8    . metFORMIN (GLUCOPHAGE) 500 MG tablet Take 1 tablet (500 mg total) by mouth 2 (two) times daily. 60 tablet 6  . rosuvastatin (CRESTOR) 20 MG tablet Take 1 tablet (20 mg total) by mouth at bedtime. Dr Maretta Bees 30 tablet 6  . telmisartan-hydrochlorothiazide (MICARDIS HCT) 80-25 MG tablet Take 1 tablet by mouth daily. 30 tablet 6  . venlafaxine (EFFEXOR) 75 MG tablet Take 1 tablet by mouth 2 (two) times daily. Dr Manuella Ghazi     No facility-administered medications prior to visit.     Review of Systems  Constitutional: Negative for chills, fever, malaise/fatigue and weight loss.  HENT:  Negative for ear discharge, ear pain and sore throat.   Eyes: Negative for blurred vision.  Respiratory: Negative for cough, sputum production, shortness of breath and wheezing.   Cardiovascular: Negative for chest pain, palpitations, orthopnea, leg swelling and PND.  Gastrointestinal: Negative for abdominal pain, blood in stool, constipation, diarrhea, heartburn, melena and nausea.  Genitourinary: Negative for dysuria, frequency, hematuria and urgency.  Musculoskeletal: Negative for back pain, joint pain, myalgias and neck pain.  Skin: Negative for pallor and rash.  Neurological: Negative for dizziness, tingling, tremors, sensory change, focal weakness, seizures, speech difficulty and headaches.  Endo/Heme/Allergies: Negative for environmental allergies, polydipsia and polyphagia. Does not bruise/bleed easily.  Psychiatric/Behavioral: Negative for confusion, depression and suicidal ideas. The patient is not nervous/anxious and does not have insomnia.      Objective  Vitals:   12/07/16 0916  BP: (!) 140/98  Pulse: 64  Weight: 206 lb (93.4 kg)  Height: 6\' 2"  (1.88 m)    Physical Exam  Constitutional: He is oriented to person, place, and time and well-developed,  well-nourished, and in no distress.  HENT:  Head: Normocephalic.  Right Ear: External ear normal.  Left Ear: External ear normal.  Nose: Nose normal.  Mouth/Throat: Oropharynx is clear and moist.  Eyes: Pupils are equal, round, and reactive to light. Conjunctivae and EOM are normal. Right eye exhibits no discharge. Left eye exhibits no discharge. No scleral icterus.  Neck: Normal range of motion. Neck supple. No JVD present. No tracheal deviation present. No thyromegaly present.  Cardiovascular: Normal rate, regular rhythm, normal heart sounds and intact distal pulses.  Exam reveals no gallop and no friction rub.   No murmur heard. Pulmonary/Chest: Breath sounds normal. No respiratory distress. He has no wheezes. He has no  rales.  Abdominal: Soft. Bowel sounds are normal. He exhibits no mass. There is no hepatosplenomegaly. There is no tenderness. There is no rebound, no guarding and no CVA tenderness.  Musculoskeletal: Normal range of motion. He exhibits no edema or tenderness.  Lymphadenopathy:    He has no cervical adenopathy.  Neurological: He is alert and oriented to person, place, and time. He has normal sensation, normal strength, normal reflexes and intact cranial nerves. No cranial nerve deficit.  Skin: Skin is warm. No rash noted.  Psychiatric: Mood and affect normal.  Nursing note and vitals reviewed.     Assessment & Plan  Problem List Items Addressed This Visit      Cardiovascular and Mediastinum   Essential hypertension   Relevant Medications   amLODipine (NORVASC) 10 MG tablet   telmisartan-hydrochlorothiazide (MICARDIS HCT) 80-25 MG tablet   carvedilol (COREG) 12.5 MG tablet   rosuvastatin (CRESTOR) 20 MG tablet     Endocrine   Diabetes (HCC)   Relevant Medications   telmisartan-hydrochlorothiazide (MICARDIS HCT) 80-25 MG tablet   rosuvastatin (CRESTOR) 20 MG tablet   Insulin Glargine (LANTUS SOLOSTAR) 100 UNIT/ML Solostar Pen   metFORMIN (GLUCOPHAGE) 500 MG tablet   Insulin Pen Needle (FIFTY50 PEN NEEDLES) 31G X 8 MM MISC   insulin lispro (HUMALOG) 100 UNIT/ML injection     Other   Hyperlipidemia   Relevant Medications   amLODipine (NORVASC) 10 MG tablet   telmisartan-hydrochlorothiazide (MICARDIS HCT) 80-25 MG tablet   carvedilol (COREG) 12.5 MG tablet   rosuvastatin (CRESTOR) 20 MG tablet    Other Visit Diagnoses    Moderate episode of recurrent major depressive disorder (HCC)    -  Primary   Relevant Medications   venlafaxine (EFFEXOR) 100 MG tablet      Meds ordered this encounter  Medications  . amLODipine (NORVASC) 10 MG tablet    Sig: Take 1 tablet (10 mg total) by mouth daily.    Dispense:  90 tablet    Refill:  1    May have 90 days, BUT needs appt this  month- last time filling  . telmisartan-hydrochlorothiazide (MICARDIS HCT) 80-25 MG tablet    Sig: Take 1 tablet by mouth daily.    Dispense:  30 tablet    Refill:  6  . carvedilol (COREG) 12.5 MG tablet    Sig: Take 2 tablets (25 mg total) by mouth 2 (two) times daily with a meal. TAKE 1 TABLET (12.5 MG TOTAL) BY MOUTH 2 (TWO) TIMES DAILY WITH A MEAL.    Dispense:  60 tablet    Refill:  6  . rosuvastatin (CRESTOR) 20 MG tablet    Sig: Take 1 tablet (20 mg total) by mouth at bedtime. Dr Maretta Bees    Dispense:  30 tablet    Refill:  6  . Insulin Glargine (LANTUS SOLOSTAR) 100 UNIT/ML Solostar Pen    Sig: INJECT 46 UNITS UNDER THE SKIN AT BEDTIME- Dr Maretta Bees    Dispense:  15 mL    Refill:  6  . metFORMIN (GLUCOPHAGE) 500 MG tablet    Sig: Take 1 tablet (500 mg total) by mouth 2 (two) times daily.    Dispense:  60 tablet    Refill:  6  . Insulin Pen Needle (FIFTY50 PEN NEEDLES) 31G X 8 MM MISC    Sig: Use 4 (four) times daily. DX: E11.8    Dispense:  100 each    Refill:  6  . insulin lispro (HUMALOG) 100 UNIT/ML injection    Sig: Inject 0.06 mLs (6 Units total) into the skin 3 (three) times daily before meals. 6-12 units with meals- Dr Maretta Bees    Dispense:  10 mL    Refill:  6  . venlafaxine (EFFEXOR) 100 MG tablet    Sig: Take 1 tablet (100 mg total) by mouth 2 (two) times daily.    Dispense:  60 tablet    Refill:  6      Dr. Otilio Miu [facility]  12/07/16

## 2016-12-11 ENCOUNTER — Other Ambulatory Visit: Payer: Self-pay

## 2016-12-11 DIAGNOSIS — I10 Essential (primary) hypertension: Secondary | ICD-10-CM

## 2016-12-11 MED ORDER — LOSARTAN POTASSIUM-HCTZ 100-25 MG PO TABS: 1.0000 | ORAL_TABLET | Freq: Every day | ORAL | 1 refills | Status: DC

## 2016-12-14 DIAGNOSIS — G6181 Chronic inflammatory demyelinating polyneuritis: Secondary | ICD-10-CM | POA: Diagnosis not present

## 2016-12-14 DIAGNOSIS — Z9989 Dependence on other enabling machines and devices: Secondary | ICD-10-CM | POA: Diagnosis not present

## 2016-12-14 DIAGNOSIS — G4733 Obstructive sleep apnea (adult) (pediatric): Secondary | ICD-10-CM | POA: Diagnosis not present

## 2016-12-15 ENCOUNTER — Other Ambulatory Visit: Payer: Self-pay

## 2016-12-15 MED ORDER — INSULIN ASPART 100 UNIT/ML ~~LOC~~ SOLN: 6.0000 [IU] | Freq: Three times a day (TID) | SUBCUTANEOUS | 2 refills | Status: DC

## 2016-12-19 ENCOUNTER — Other Ambulatory Visit: Payer: Self-pay

## 2017-01-11 ENCOUNTER — Other Ambulatory Visit: Payer: Self-pay

## 2017-01-12 ENCOUNTER — Other Ambulatory Visit: Payer: Medicare Other

## 2017-01-12 DIAGNOSIS — Z794 Long term (current) use of insulin: Principal | ICD-10-CM

## 2017-01-12 DIAGNOSIS — E119 Type 2 diabetes mellitus without complications: Secondary | ICD-10-CM

## 2017-01-12 MED ORDER — INSULIN ASPART 100 UNIT/ML FLEXPEN: 6.0000 [IU] | PEN_INJECTOR | Freq: Three times a day (TID) | SUBCUTANEOUS | 5 refills | Status: DC

## 2017-01-15 ENCOUNTER — Encounter: Payer: Self-pay | Admitting: Oncology

## 2017-01-15 ENCOUNTER — Inpatient Hospital Stay: Payer: Medicare Other

## 2017-01-15 ENCOUNTER — Other Ambulatory Visit: Payer: Self-pay | Admitting: *Deleted

## 2017-01-15 ENCOUNTER — Other Ambulatory Visit
Admission: RE | Admit: 2017-01-15 | Discharge: 2017-01-15 | Disposition: A | Discharge status: Home / Self Care | Payer: Medicare Other | Source: Ambulatory Visit | Attending: Family Medicine | Admitting: Family Medicine

## 2017-01-15 ENCOUNTER — Inpatient Hospital Stay: Payer: Medicare Other | Attending: Oncology | Admitting: Oncology

## 2017-01-15 VITALS — BP 176/108 | HR 100 | Temp 97.6°F | Resp 18 | Wt 205.4 lb

## 2017-01-15 VITALS — BP 156/81 | HR 91 | Temp 96.5°F | Resp 18

## 2017-01-15 DIAGNOSIS — Z794 Long term (current) use of insulin: Secondary | ICD-10-CM | POA: Diagnosis not present

## 2017-01-15 DIAGNOSIS — R531 Weakness: Secondary | ICD-10-CM | POA: Diagnosis not present

## 2017-01-15 DIAGNOSIS — Z87891 Personal history of nicotine dependence: Secondary | ICD-10-CM | POA: Diagnosis not present

## 2017-01-15 DIAGNOSIS — Z880 Allergy status to penicillin: Secondary | ICD-10-CM | POA: Diagnosis not present

## 2017-01-15 DIAGNOSIS — I10 Essential (primary) hypertension: Secondary | ICD-10-CM

## 2017-01-15 DIAGNOSIS — Z5111 Encounter for antineoplastic chemotherapy: Secondary | ICD-10-CM | POA: Insufficient documentation

## 2017-01-15 DIAGNOSIS — G6181 Chronic inflammatory demyelinating polyneuritis: Secondary | ICD-10-CM

## 2017-01-15 DIAGNOSIS — E119 Type 2 diabetes mellitus without complications: Secondary | ICD-10-CM | POA: Insufficient documentation

## 2017-01-15 DIAGNOSIS — Z79899 Other long term (current) drug therapy: Secondary | ICD-10-CM | POA: Diagnosis not present

## 2017-01-15 DIAGNOSIS — I129 Hypertensive chronic kidney disease with stage 1 through stage 4 chronic kidney disease, or unspecified chronic kidney disease: Secondary | ICD-10-CM

## 2017-01-15 DIAGNOSIS — E876 Hypokalemia: Secondary | ICD-10-CM

## 2017-01-15 DIAGNOSIS — D631 Anemia in chronic kidney disease: Secondary | ICD-10-CM

## 2017-01-15 DIAGNOSIS — G4730 Sleep apnea, unspecified: Secondary | ICD-10-CM

## 2017-01-15 DIAGNOSIS — N189 Chronic kidney disease, unspecified: Secondary | ICD-10-CM

## 2017-01-15 DIAGNOSIS — E1122 Type 2 diabetes mellitus with diabetic chronic kidney disease: Secondary | ICD-10-CM | POA: Diagnosis not present

## 2017-01-15 LAB — COMPREHENSIVE METABOLIC PANEL
ALK PHOS: 69 U/L (ref 38–126)
ALT: 28 U/L (ref 17–63)
ANION GAP: 15 (ref 5–15)
AST: 37 U/L (ref 15–41)
Albumin: 4.6 g/dL (ref 3.5–5.0)
BILIRUBIN TOTAL: 0.6 mg/dL (ref 0.3–1.2)
BUN: 26 mg/dL — ABNORMAL HIGH (ref 6–20)
CALCIUM: 9.7 mg/dL (ref 8.9–10.3)
CO2: 29 mmol/L (ref 22–32)
CREATININE: 1.67 mg/dL — AB (ref 0.61–1.24)
Chloride: 88 mmol/L — ABNORMAL LOW (ref 101–111)
GFR calc non Af Amer: 41 mL/min — ABNORMAL LOW (ref >60)
GFR, EST AFRICAN AMERICAN: 48 mL/min — AB (ref >60)
Glucose, Bld: 239 mg/dL — ABNORMAL HIGH (ref 65–99)
Potassium: 3.3 mmol/L — ABNORMAL LOW (ref 3.5–5.1)
Sodium: 132 mmol/L — ABNORMAL LOW (ref 135–145)
TOTAL PROTEIN: 8.6 g/dL — AB (ref 6.5–8.1)

## 2017-01-15 LAB — CBC WITH DIFFERENTIAL/PLATELET
BASOS PCT: 1 %
Basophils Absolute: 0.1 10*3/uL (ref 0–0.1)
EOS ABS: 0.3 10*3/uL (ref 0–0.7)
EOS PCT: 3 %
HCT: 43.1 % (ref 40.0–52.0)
Hemoglobin: 15 g/dL (ref 13.0–18.0)
LYMPHS ABS: 3.2 10*3/uL (ref 1.0–3.6)
Lymphocytes Relative: 32 %
MCH: 33.8 pg (ref 26.0–34.0)
MCHC: 34.8 g/dL (ref 32.0–36.0)
MCV: 97.1 fL (ref 80.0–100.0)
MONO ABS: 0.9 10*3/uL (ref 0.2–1.0)
MONOS PCT: 9 %
Neutro Abs: 5.7 10*3/uL (ref 1.4–6.5)
Neutrophils Relative %: 55 %
Platelets: 188 10*3/uL (ref 150–440)
RBC: 4.44 MIL/uL (ref 4.40–5.90)
RDW: 12.4 % (ref 11.5–14.5)
WBC: 10.2 10*3/uL (ref 3.8–10.6)

## 2017-01-15 MED ORDER — DIPHENHYDRAMINE HCL 25 MG PO CAPS
ORAL_CAPSULE | ORAL | Status: AC
  Filled 2017-01-15: qty 1

## 2017-01-15 MED ORDER — HEPARIN SOD (PORK) LOCK FLUSH 100 UNIT/ML IV SOLN
500.0000 [IU] | Freq: Once | INTRAVENOUS | Status: AC
  Administered 2017-01-15: 500 [IU] via INTRAVENOUS
  Filled 2017-01-15: qty 5

## 2017-01-15 MED ORDER — ACETAMINOPHEN 325 MG PO TABS
650.0000 mg | ORAL_TABLET | Freq: Once | ORAL | Status: AC
  Administered 2017-01-15: 650 mg via ORAL
  Filled 2017-01-15: qty 2

## 2017-01-15 MED ORDER — SODIUM CHLORIDE 0.9 % IV SOLN
Freq: Once | INTRAVENOUS | Status: AC
  Administered 2017-01-15: 10:00:00 via INTRAVENOUS
  Filled 2017-01-15: qty 1000

## 2017-01-15 MED ORDER — IMMUNE GLOBULIN (HUMAN) 20 GM/200ML IV SOLN
50.0000 g | Freq: Every day | INTRAVENOUS | Status: DC
Start: 2017-01-15 — End: 2017-01-15
  Administered 2017-01-15: 10:00:00 50 g via INTRAVENOUS
  Filled 2017-01-15: qty 100

## 2017-01-15 MED ORDER — SODIUM CHLORIDE 0.9% FLUSH
10.0000 mL | INTRAVENOUS | Status: DC | PRN
  Administered 2017-01-15: 10 mL via INTRAVENOUS
  Filled 2017-01-15: qty 10

## 2017-01-15 MED ORDER — POTASSIUM CHLORIDE ER 20 MEQ PO TBCR: 20.0000 meq | EXTENDED_RELEASE_TABLET | Freq: Every day | ORAL | 0 refills | Status: DC

## 2017-01-15 MED ORDER — ALTEPLASE 2 MG IJ SOLR: 2.0000 mg | Freq: Once | INTRAMUSCULAR | Status: DC | PRN

## 2017-01-15 MED ORDER — DIPHENHYDRAMINE HCL 25 MG PO TABS
25.0000 mg | ORAL_TABLET | Freq: Once | ORAL | Status: AC
  Administered 2017-01-15: 25 mg via ORAL
  Filled 2017-01-15: qty 1

## 2017-01-15 NOTE — Patient Instructions (Signed)

## 2017-01-15 NOTE — Progress Notes (Signed)
Here for follow up. Labs this am and willhave infusion today. BP elevated at 176/108,175,107. Asymptomatic. Stated bp " always highwhen I go to theDr" will inform chemo staff and Dr Janese Banks  Added has been dizzy over last week but denies that today.

## 2017-01-15 NOTE — Progress Notes (Signed)
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 Dr. Jennings Books is his outpatient neurologist and he has been getting IVIG infusion at home 2 mg/kg over 4 days every 3 months maintenance regimen. His last dose was in October 2017 and he was due for a repeat dose in January 2018. However insurance denied home infusions and he is required to receive infusions at the infusion center and hence has been referred to [place] for the same. Patient's last visit with Dr. Manuella Ghazi was inJuly 2017. Patient states that he has been tolerating his IVIG well without any significant side effects. At one point in the past he did have a rash in his palms as well as developed a headache when it was given too fast. He has not had any side effects recently. He has responded well to IVIG and is now able to walk with a cane. When he was initially diagnosed in 2012 he was unable to lift his whole leg up and also had difficulty with his hands.Marland Kitchen EMG in April 2012 showed abnormal electrodiagnostic study consistent with generalized severe acquired and sensory motor polyneuropathy with neuropathic changes on the legs and all limbs. Lumbar puncture showed mildly basic protein was 0.6 normal IgG synthesis rate was -8.8 normal he has 0 on oligoclonal bands in his CSF. He was also seen at [facility] for second opinion and diagnosis was confirmed. He does have a port in place to receive IVIG.  1st dose of IVIG restarted at [facility] in  feb 2018  Interval history- tolerated IVIG well. No side effects. He does feel like it is helping him. Over last 1 week he feels weak in his proximal legs which makes him feel unsteady. Denies any lightheadedness or vertigo  ECOG PS- 1 Pain scale- 0   Review of systems- Review of Systems  Constitutional: Negative for chills, fever, malaise/fatigue and weight loss.  HENT: Negative for congestion, ear discharge and nosebleeds.   Eyes: Negative for blurred vision.  Respiratory: Negative for cough, hemoptysis, sputum production, shortness of breath and wheezing.   Cardiovascular: Negative for chest pain, palpitations, orthopnea and claudication.  Gastrointestinal: Negative for abdominal pain, blood in stool, constipation, diarrhea, heartburn, melena, nausea and vomiting.  Genitourinary: Negative for dysuria, flank pain, frequency, hematuria and urgency.  Musculoskeletal: Negative for back pain, joint pain and myalgias.  Skin: Negative for rash.  Neurological: Negative for dizziness, tingling, focal weakness, seizures, weakness and headaches.  Endo/Heme/Allergies: Does not bruise/bleed easily.  Psychiatric/Behavioral: Negative for depression and suicidal ideas. The patient does not have insomnia.      Current treatment- IVIG  Allergies  Allergen Reactions  . Penicillins Other (See Comments)    Unknown reaction from chilhood     Past Medical History:  Diagnosis Date  . CIDP (chronic inflammatory demyelinating polyneuropathy) ([place])   . CIDP (chronic inflammatory demyelinating polyneuropathy) ([place])   . Diabetes mellitus without complication ([place])   . Elevated lipids   . Hypertension   . Sleep apnea  Past Surgical History:  Procedure Laterality Date  . APPENDECTOMY    . COLONOSCOPY  2011   cleared for 5 yrs- Dr Allen Norris  . KNEE SURGERY Right    scope  . PORTACATH PLACEMENT Right 09/16/2015   Procedure: INSERTION PORT-A-CATH;  Surgeon: Leonie Green, MD;  Location: [facility]  ORS;  Service: General;  Laterality: Right;  . TONSILLECTOMY      Social History   Social History  . Marital status: Married    Spouse name: N/A  . Number of children: N/A  . Years of education: N/A   Occupational History  . Not on file.   Social History Main Topics  . Smoking status: Former Smoker    Quit date: 05/08/1978  . Smokeless tobacco: Never Used  . Alcohol use 1.8 oz/week    3 Cans of beer per week     Comment: none since tuesday 5/9, usually a glass of wine or 2 at dinner daily  . Drug use: No  . Sexual activity: Yes   Other Topics Concern  . Not on file   Social History Narrative  . No narrative on file    Family History  Problem Relation Age of Onset  . Diabetes Father   . Heart disease Father   . Angina Mother   . Macular degeneration Mother   . Lung cancer Sister      Current Outpatient Prescriptions:  .  amLODipine (NORVASC) 10 MG tablet, Take 1 tablet (10 mg total) by mouth daily., Disp: 90 tablet, Rfl: 1 .  carvedilol (COREG) 12.5 MG tablet, Take 2 tablets (25 mg total) by mouth 2 (two) times daily with a meal. TAKE 1 TABLET (12.5 MG TOTAL) BY MOUTH 2 (TWO) TIMES DAILY WITH A MEAL., Disp: 60 tablet, Rfl: 6 .  Immune Globulin 10% (GAMUNEX-C) 10 GM/100ML SOLN, Inject into the vein every 4 (four) months. 50 grams- Dr Manuella Ghazi, Disp: , Rfl:  .  insulin aspart (NOVOLOG FLEXPEN) 100 UNIT/ML FlexPen, Inject 6 Units into the skin 3 (three) times daily with meals. May adjust up to 12 units 3 times daily if needed, Disp: 15 mL, Rfl: 5 .  Insulin Glargine (LANTUS SOLOSTAR) 100 UNIT/ML Solostar Pen, INJECT 46 UNITS UNDER THE SKIN AT BEDTIME- Dr Maretta Bees, Disp: 15 mL, Rfl: 6 .  Insulin Pen Needle (FIFTY50 PEN NEEDLES) 31G X 8 MM MISC, Use 4 (four) times daily. DX: E11.8, Disp: 100 each, Rfl: 6 .  loratadine (CLARITIN) 10 MG tablet, Take 1 tablet (10 mg total) by mouth daily., Disp: 30 tablet, Rfl: 11 .  losartan-hydrochlorothiazide (HYZAAR) 100-25 MG tablet, Take 1 tablet  by mouth daily., Disp: 90 tablet, Rfl: 1 .  metFORMIN (GLUCOPHAGE) 500 MG tablet, Take 1 tablet (500 mg total) by mouth 2 (two) times daily., Disp: 60 tablet, Rfl: 6 .  rosuvastatin (CRESTOR) 20 MG tablet, Take 1 tablet (20 mg total) by mouth at bedtime. Dr Maretta Bees, Disp: 30 tablet, Rfl: 6 .  venlafaxine (EFFEXOR) 100 MG tablet, Take 1 tablet (100 mg total) by mouth 2 (two) times daily., Disp: 60 tablet, Rfl: 6  Physical exam:  Vitals:   01/15/17 0928  BP: (!) 176/108  Pulse: 100  Resp: 18  Temp: 97.6 F (36.4 C)  TempSrc: Tympanic  Weight: 205 lb 5.7 oz (93.2 kg)   Physical Exam  Constitutional: He is oriented to person, place, and time and well-developed, well-nourished, and in no distress.  HENT:  Head: Normocephalic and atraumatic.  Eyes: Pupils are equal,  round, and reactive to light. EOM are normal.  Neck: Normal range of motion.  Cardiovascular: Normal rate, regular rhythm and normal heart sounds.   Pulmonary/Chest: Effort normal and breath sounds normal.  Abdominal: Soft. Bowel sounds are normal.  Neurological: He is alert and oriented to person, place, and time.  Skin: Skin is warm and dry.     CMP Latest Ref Rng & Units 01/15/2017  Glucose 65 - 99 mg/dL 239(H)  BUN 6 - 20 mg/dL 26(H)  Creatinine 0.61 - 1.24 mg/dL 1.67(H)  Sodium 135 - 145 mmol/L 132(L)  Potassium 3.5 - 5.1 mmol/L 3.3(L)  Chloride 101 - 111 mmol/L 88(L)  CO2 22 - 32 mmol/L 29  Calcium 8.9 - 10.3 mg/dL 9.7  Total Protein 6.5 - 8.1 g/dL 8.6(H)  Total Bilirubin 0.3 - 1.2 mg/dL 0.6  Alkaline Phos 38 - 126 U/L 69  AST 15 - 41 U/L 37  ALT 17 - 63 U/L 28   CBC Latest Ref Rng & Units 01/15/2017  WBC 3.8 - 10.6 K/uL 10.2  Hemoglobin 13.0 - 18.0 g/dL 15.0  Hematocrit 40.0 - 52.0 % 43.1  Platelets 150 - 440 K/uL 188      Assessment and plan- Patient is a 66 y.o. male who is here to receive IVIG for CIDP  Counts ok to proceed with next dose of IVIG today. He gets 50 gm daily for 4 days starting today.  His CKD is stable but needs to be followed by PCP and neurology since he is getting IVIG. Last seen by Dr. Manuella Ghazi last month. Plan is to receive IVIG today and then shift to Q5 months. However patient plans to speak to Dr. Manuella Ghazi and try toget next dose in 3 months.  He has tolerated IVIG well without any significant side effects. rtc in 3 months with cbc, cmp  Hypokalemia- will start him on 20 meq of oral K and repeat Bmp next week  CKD- needs to eb followed by dr. Manuella Ghazi and Dr. Ronnald Ramp  Uncontrolled HTN- onm repeat check systoloic BP was 160. Patient reports his BP is 140's at home. This needs to be followed by PCP   Visit Diagnosis 1. Chronic inflammatory demyelinating polyneuritis (HCC)   2. Long-term current use of intravenous immunoglobulin (IVIG)   3. Anemia in chronic kidney disease, unspecified CKD stage   4. Uncontrolled hypertension   5. Hypokalemia      Dr. Randa Evens, MD, MPH [facility] at [facility] Pager- [phone] 01/15/2017 10:43 AM

## 2017-01-16 ENCOUNTER — Inpatient Hospital Stay: Payer: Medicare Other

## 2017-01-16 ENCOUNTER — Other Ambulatory Visit: Payer: Self-pay

## 2017-01-16 VITALS — BP 145/82 | HR 98 | Temp 97.7°F | Resp 18

## 2017-01-16 DIAGNOSIS — Z5111 Encounter for antineoplastic chemotherapy: Secondary | ICD-10-CM | POA: Diagnosis not present

## 2017-01-16 DIAGNOSIS — N189 Chronic kidney disease, unspecified: Secondary | ICD-10-CM | POA: Diagnosis not present

## 2017-01-16 DIAGNOSIS — Z794 Long term (current) use of insulin: Principal | ICD-10-CM

## 2017-01-16 DIAGNOSIS — E876 Hypokalemia: Secondary | ICD-10-CM | POA: Diagnosis not present

## 2017-01-16 DIAGNOSIS — G6181 Chronic inflammatory demyelinating polyneuritis: Secondary | ICD-10-CM | POA: Diagnosis not present

## 2017-01-16 DIAGNOSIS — I129 Hypertensive chronic kidney disease with stage 1 through stage 4 chronic kidney disease, or unspecified chronic kidney disease: Secondary | ICD-10-CM | POA: Diagnosis not present

## 2017-01-16 DIAGNOSIS — E1122 Type 2 diabetes mellitus with diabetic chronic kidney disease: Secondary | ICD-10-CM | POA: Diagnosis not present

## 2017-01-16 DIAGNOSIS — E119 Type 2 diabetes mellitus without complications: Secondary | ICD-10-CM

## 2017-01-16 LAB — HEMOGLOBIN A1C
HEMOGLOBIN A1C: 11.7 % — AB (ref 4.8–5.6)
MEAN PLASMA GLUCOSE: 289 mg/dL

## 2017-01-16 MED ORDER — ACETAMINOPHEN 325 MG PO TABS
650.0000 mg | ORAL_TABLET | Freq: Once | ORAL | Status: AC
  Administered 2017-01-16: 650 mg via ORAL
  Filled 2017-01-16: qty 2

## 2017-01-16 MED ORDER — DIPHENHYDRAMINE HCL 25 MG PO CAPS
25.0000 mg | ORAL_CAPSULE | Freq: Once | ORAL | Status: AC
  Administered 2017-01-16: 25 mg via ORAL
  Filled 2017-01-16: qty 1

## 2017-01-16 MED ORDER — INSULIN GLARGINE 100 UNIT/ML SOLOSTAR PEN: PEN_INJECTOR | SUBCUTANEOUS | 6 refills | Status: AC

## 2017-01-16 MED ORDER — IMMUNE GLOBULIN (HUMAN) 10 GM/100ML IV SOLN
50.0000 g | Freq: Every day | INTRAVENOUS | Status: DC
  Administered 2017-01-16: 50 g via INTRAVENOUS
  Filled 2017-01-16: qty 100

## 2017-01-16 MED ORDER — SODIUM CHLORIDE 0.9% FLUSH
10.0000 mL | INTRAVENOUS | Status: DC | PRN
  Administered 2017-01-16: 10 mL
  Filled 2017-01-16: qty 10

## 2017-01-16 MED ORDER — HEPARIN SOD (PORK) LOCK FLUSH 100 UNIT/ML IV SOLN
500.0000 [IU] | Freq: Once | INTRAVENOUS | Status: AC | PRN
  Administered 2017-01-16: 500 [IU]
  Filled 2017-01-16: qty 5

## 2017-01-17 ENCOUNTER — Inpatient Hospital Stay: Payer: Medicare Other

## 2017-01-17 VITALS — BP 145/77 | HR 82 | Temp 97.0°F | Resp 18

## 2017-01-17 DIAGNOSIS — G6181 Chronic inflammatory demyelinating polyneuritis: Secondary | ICD-10-CM

## 2017-01-17 DIAGNOSIS — N189 Chronic kidney disease, unspecified: Secondary | ICD-10-CM | POA: Diagnosis not present

## 2017-01-17 DIAGNOSIS — E876 Hypokalemia: Secondary | ICD-10-CM | POA: Diagnosis not present

## 2017-01-17 DIAGNOSIS — I129 Hypertensive chronic kidney disease with stage 1 through stage 4 chronic kidney disease, or unspecified chronic kidney disease: Secondary | ICD-10-CM | POA: Diagnosis not present

## 2017-01-17 DIAGNOSIS — Z5111 Encounter for antineoplastic chemotherapy: Secondary | ICD-10-CM | POA: Diagnosis not present

## 2017-01-17 DIAGNOSIS — E1122 Type 2 diabetes mellitus with diabetic chronic kidney disease: Secondary | ICD-10-CM | POA: Diagnosis not present

## 2017-01-17 MED ORDER — IMMUNE GLOBULIN (HUMAN) 10 GM/100ML IV SOLN
50.0000 g | Freq: Every day | INTRAVENOUS | Status: DC
  Administered 2017-01-17: 09:00:00 50 g via INTRAVENOUS
  Filled 2017-01-17: qty 100

## 2017-01-17 MED ORDER — ACETAMINOPHEN 325 MG PO TABS
650.0000 mg | ORAL_TABLET | Freq: Every day | ORAL | Status: DC
  Administered 2017-01-17: 650 mg via ORAL

## 2017-01-17 MED ORDER — ACETAMINOPHEN 325 MG PO TABS
ORAL_TABLET | ORAL | Status: AC
Start: 2017-01-17 — End: 2017-01-17
  Filled 2017-01-17: qty 2

## 2017-01-17 MED ORDER — DIPHENHYDRAMINE HCL 25 MG PO CAPS
ORAL_CAPSULE | ORAL | Status: AC
  Filled 2017-01-17: qty 1

## 2017-01-17 MED ORDER — SODIUM CHLORIDE 0.9% FLUSH
10.0000 mL | INTRAVENOUS | Status: DC | PRN
  Administered 2017-01-17: 10 mL via INTRAVENOUS
  Filled 2017-01-17: qty 10

## 2017-01-17 MED ORDER — DIPHENHYDRAMINE HCL 25 MG PO CAPS
25.0000 mg | ORAL_CAPSULE | Freq: Every day | ORAL | Status: DC
  Administered 2017-01-17: 25 mg via ORAL

## 2017-01-17 MED ORDER — HEPARIN SOD (PORK) LOCK FLUSH 100 UNIT/ML IV SOLN
500.0000 [IU] | Freq: Once | INTRAVENOUS | Status: AC
  Administered 2017-01-17: 500 [IU] via INTRAVENOUS
  Filled 2017-01-17: qty 5

## 2017-01-17 MED ORDER — SODIUM CHLORIDE 0.9 % IV SOLN
Freq: Once | INTRAVENOUS | Status: AC
  Administered 2017-01-17: 09:00:00 via INTRAVENOUS
  Filled 2017-01-17: qty 1000

## 2017-01-18 ENCOUNTER — Inpatient Hospital Stay: Payer: Medicare Other

## 2017-01-18 VITALS — BP 138/81 | HR 87 | Temp 97.2°F | Resp 18

## 2017-01-18 DIAGNOSIS — E876 Hypokalemia: Secondary | ICD-10-CM | POA: Insufficient documentation

## 2017-01-18 DIAGNOSIS — E1122 Type 2 diabetes mellitus with diabetic chronic kidney disease: Secondary | ICD-10-CM

## 2017-01-18 DIAGNOSIS — N189 Chronic kidney disease, unspecified: Secondary | ICD-10-CM

## 2017-01-18 DIAGNOSIS — G6181 Chronic inflammatory demyelinating polyneuritis: Secondary | ICD-10-CM

## 2017-01-18 DIAGNOSIS — Z5111 Encounter for antineoplastic chemotherapy: Secondary | ICD-10-CM | POA: Diagnosis not present

## 2017-01-18 DIAGNOSIS — I129 Hypertensive chronic kidney disease with stage 1 through stage 4 chronic kidney disease, or unspecified chronic kidney disease: Secondary | ICD-10-CM | POA: Diagnosis not present

## 2017-01-18 MED ORDER — DIPHENHYDRAMINE HCL 25 MG PO CAPS
25.0000 mg | ORAL_CAPSULE | Freq: Every day | ORAL | Status: DC
  Administered 2017-01-18: 25 mg via ORAL

## 2017-01-18 MED ORDER — SODIUM CHLORIDE 0.9% FLUSH
10.0000 mL | INTRAVENOUS | Status: DC | PRN
  Administered 2017-01-18: 10 mL via INTRAVENOUS
  Filled 2017-01-18: qty 10

## 2017-01-18 MED ORDER — IMMUNE GLOBULIN (HUMAN) 20 GM/200ML IV SOLN
50.0000 g | Freq: Every day | INTRAVENOUS | Status: DC
  Administered 2017-01-18: 50 g via INTRAVENOUS
  Filled 2017-01-18: qty 100

## 2017-01-18 MED ORDER — DEXTROSE 5 % IV SOLN
Freq: Once | INTRAVENOUS | Status: AC
  Administered 2017-01-18: 10:00:00 via INTRAVENOUS
  Filled 2017-01-18: qty 1000

## 2017-01-18 MED ORDER — HEPARIN SOD (PORK) LOCK FLUSH 100 UNIT/ML IV SOLN
500.0000 [IU] | Freq: Once | INTRAVENOUS | Status: AC
Start: 2017-01-18 — End: 2017-01-18
  Administered 2017-01-18: 500 [IU] via INTRAVENOUS
  Filled 2017-01-18: qty 5

## 2017-01-18 MED ORDER — ACETAMINOPHEN 325 MG PO TABS
650.0000 mg | ORAL_TABLET | Freq: Every day | ORAL | Status: DC
  Administered 2017-01-18: 650 mg via ORAL

## 2017-01-22 ENCOUNTER — Ambulatory Visit: Payer: Medicare Other | Admitting: Family Medicine

## 2017-01-22 ENCOUNTER — Inpatient Hospital Stay: Payer: Medicare Other

## 2017-01-22 DIAGNOSIS — G6181 Chronic inflammatory demyelinating polyneuritis: Secondary | ICD-10-CM | POA: Diagnosis not present

## 2017-01-22 DIAGNOSIS — E876 Hypokalemia: Secondary | ICD-10-CM | POA: Diagnosis not present

## 2017-01-22 DIAGNOSIS — Z5111 Encounter for antineoplastic chemotherapy: Secondary | ICD-10-CM | POA: Diagnosis not present

## 2017-01-22 DIAGNOSIS — I129 Hypertensive chronic kidney disease with stage 1 through stage 4 chronic kidney disease, or unspecified chronic kidney disease: Secondary | ICD-10-CM | POA: Diagnosis not present

## 2017-01-22 DIAGNOSIS — E1122 Type 2 diabetes mellitus with diabetic chronic kidney disease: Secondary | ICD-10-CM | POA: Diagnosis not present

## 2017-01-22 DIAGNOSIS — N189 Chronic kidney disease, unspecified: Secondary | ICD-10-CM | POA: Diagnosis not present

## 2017-01-22 LAB — BASIC METABOLIC PANEL
ANION GAP: 12 (ref 5–15)
BUN: 25 mg/dL — ABNORMAL HIGH (ref 6–20)
CALCIUM: 9.7 mg/dL (ref 8.9–10.3)
CO2: 32 mmol/L (ref 22–32)
Chloride: 92 mmol/L — ABNORMAL LOW (ref 101–111)
Creatinine, Ser: 1.36 mg/dL — ABNORMAL HIGH (ref 0.61–1.24)
GFR, EST NON AFRICAN AMERICAN: 53 mL/min — AB (ref >60)
GLUCOSE: 190 mg/dL — AB (ref 65–99)
POTASSIUM: 3.8 mmol/L (ref 3.5–5.1)
SODIUM: 136 mmol/L (ref 135–145)

## 2017-01-25 DIAGNOSIS — E1159 Type 2 diabetes mellitus with other circulatory complications: Secondary | ICD-10-CM | POA: Diagnosis not present

## 2017-01-25 DIAGNOSIS — I10 Essential (primary) hypertension: Secondary | ICD-10-CM | POA: Diagnosis not present

## 2017-01-25 DIAGNOSIS — E1142 Type 2 diabetes mellitus with diabetic polyneuropathy: Secondary | ICD-10-CM | POA: Diagnosis not present

## 2017-01-25 DIAGNOSIS — Z794 Long term (current) use of insulin: Secondary | ICD-10-CM | POA: Diagnosis not present

## 2017-01-25 DIAGNOSIS — E785 Hyperlipidemia, unspecified: Secondary | ICD-10-CM | POA: Diagnosis not present

## 2017-01-25 DIAGNOSIS — E1169 Type 2 diabetes mellitus with other specified complication: Secondary | ICD-10-CM | POA: Diagnosis not present

## 2017-04-16 ENCOUNTER — Ambulatory Visit: Payer: Medicare Other

## 2017-04-16 ENCOUNTER — Ambulatory Visit: Payer: Medicare Other | Admitting: Oncology

## 2017-04-16 ENCOUNTER — Other Ambulatory Visit: Payer: Medicare Other

## 2017-04-17 ENCOUNTER — Ambulatory Visit: Payer: Medicare Other

## 2017-04-18 ENCOUNTER — Ambulatory Visit: Payer: Medicare Other

## 2017-04-19 ENCOUNTER — Ambulatory Visit: Payer: Medicare Other

## 2017-05-14 ENCOUNTER — Inpatient Hospital Stay: Payer: Medicare Other | Attending: Oncology | Admitting: Oncology

## 2017-05-14 ENCOUNTER — Other Ambulatory Visit: Payer: Self-pay

## 2017-05-14 ENCOUNTER — Inpatient Hospital Stay: Payer: Medicare Other

## 2017-05-14 ENCOUNTER — Other Ambulatory Visit: Payer: Self-pay | Admitting: *Deleted

## 2017-05-14 ENCOUNTER — Encounter: Payer: Self-pay | Admitting: Oncology

## 2017-05-14 VITALS — BP 148/74 | HR 94 | Temp 98.2°F | Resp 18

## 2017-05-14 VITALS — BP 190/99 | HR 108 | Temp 97.6°F | Resp 18 | Wt 206.2 lb

## 2017-05-14 DIAGNOSIS — G6181 Chronic inflammatory demyelinating polyneuritis: Secondary | ICD-10-CM | POA: Diagnosis not present

## 2017-05-14 DIAGNOSIS — Z79899 Other long term (current) drug therapy: Secondary | ICD-10-CM | POA: Diagnosis not present

## 2017-05-14 DIAGNOSIS — Z87891 Personal history of nicotine dependence: Secondary | ICD-10-CM | POA: Insufficient documentation

## 2017-05-14 DIAGNOSIS — I10 Essential (primary) hypertension: Secondary | ICD-10-CM

## 2017-05-14 DIAGNOSIS — Z95828 Presence of other vascular implants and grafts: Secondary | ICD-10-CM

## 2017-05-14 DIAGNOSIS — N189 Chronic kidney disease, unspecified: Secondary | ICD-10-CM | POA: Diagnosis not present

## 2017-05-14 DIAGNOSIS — E876 Hypokalemia: Secondary | ICD-10-CM | POA: Insufficient documentation

## 2017-05-14 LAB — COMPREHENSIVE METABOLIC PANEL
ALBUMIN: 4.6 g/dL (ref 3.5–5.0)
ALT: 33 U/L (ref 17–63)
AST: 42 U/L — AB (ref 15–41)
Alkaline Phosphatase: 71 U/L (ref 38–126)
Anion gap: 16 — ABNORMAL HIGH (ref 5–15)
BUN: 21 mg/dL — AB (ref 6–20)
CHLORIDE: 89 mmol/L — AB (ref 101–111)
CO2: 29 mmol/L (ref 22–32)
Calcium: 9.5 mg/dL (ref 8.9–10.3)
Creatinine, Ser: 1.42 mg/dL — ABNORMAL HIGH (ref 0.61–1.24)
GFR calc Af Amer: 58 mL/min — ABNORMAL LOW (ref >60)
GFR calc non Af Amer: 50 mL/min — ABNORMAL LOW (ref >60)
GLUCOSE: 269 mg/dL — AB (ref 65–99)
POTASSIUM: 3.3 mmol/L — AB (ref 3.5–5.1)
Sodium: 134 mmol/L — ABNORMAL LOW (ref 135–145)
Total Bilirubin: 0.7 mg/dL (ref 0.3–1.2)
Total Protein: 8.8 g/dL — ABNORMAL HIGH (ref 6.5–8.1)

## 2017-05-14 LAB — CBC WITH DIFFERENTIAL/PLATELET
Basophils Absolute: 0.1 10*3/uL (ref 0–0.1)
Basophils Relative: 1 %
EOS PCT: 3 %
Eosinophils Absolute: 0.3 10*3/uL (ref 0–0.7)
HCT: 42.6 % (ref 40.0–52.0)
Hemoglobin: 15 g/dL (ref 13.0–18.0)
LYMPHS PCT: 31 %
Lymphs Abs: 3.1 10*3/uL (ref 1.0–3.6)
MCH: 33.5 pg (ref 26.0–34.0)
MCHC: 35.2 g/dL (ref 32.0–36.0)
MCV: 95.3 fL (ref 80.0–100.0)
MONO ABS: 0.7 10*3/uL (ref 0.2–1.0)
Monocytes Relative: 8 %
Neutro Abs: 5.6 10*3/uL (ref 1.4–6.5)
Neutrophils Relative %: 57 %
PLATELETS: 213 10*3/uL (ref 150–440)
RBC: 4.47 MIL/uL (ref 4.40–5.90)
RDW: 12.5 % (ref 11.5–14.5)
WBC: 9.9 10*3/uL (ref 3.8–10.6)

## 2017-05-14 MED ORDER — HEPARIN SOD (PORK) LOCK FLUSH 100 UNIT/ML IV SOLN
500.0000 [IU] | Freq: Once | INTRAVENOUS | Status: AC
  Administered 2017-05-14: 500 [IU] via INTRAVENOUS

## 2017-05-14 MED ORDER — DIPHENHYDRAMINE HCL 25 MG PO CAPS
25.0000 mg | ORAL_CAPSULE | Freq: Once | ORAL | Status: AC
  Administered 2017-05-14: 25 mg via ORAL
  Filled 2017-05-14: qty 1

## 2017-05-14 MED ORDER — IMMUNE GLOBULIN (HUMAN) 10 GM/100ML IV SOLN
50.0000 g | Freq: Once | INTRAVENOUS | Status: AC
  Administered 2017-05-14: 50 g via INTRAVENOUS
  Filled 2017-05-14: qty 100

## 2017-05-14 MED ORDER — ACETAMINOPHEN 325 MG PO TABS
650.0000 mg | ORAL_TABLET | Freq: Once | ORAL | Status: AC
  Administered 2017-05-14: 650 mg via ORAL
  Filled 2017-05-14: qty 2

## 2017-05-14 MED ORDER — IMMUNE GLOBULIN (HUMAN) 20 GM/200ML IV SOLN
50.0000 g | INTRAVENOUS | Status: DC
  Filled 2017-05-14: qty 100

## 2017-05-14 MED ORDER — SODIUM CHLORIDE 0.9% FLUSH
10.0000 mL | INTRAVENOUS | Status: DC | PRN
  Administered 2017-05-14: 10 mL via INTRAVENOUS
  Filled 2017-05-14: qty 10

## 2017-05-14 MED ORDER — POTASSIUM CHLORIDE ER 20 MEQ PO TBCR: 20.0000 meq | EXTENDED_RELEASE_TABLET | Freq: Every day | ORAL | 0 refills | Status: DC

## 2017-05-14 MED ORDER — IMMUNE GLOBULIN (HUMAN) 20 GM/200ML IV SOLN: 50.0000 g | INTRAVENOUS | Status: DC

## 2017-05-14 MED ORDER — SODIUM CHLORIDE 0.9 % IV SOLN
Freq: Once | INTRAVENOUS | Status: AC
  Administered 2017-05-14: 10:00:00 via INTRAVENOUS
  Filled 2017-05-14: qty 1000

## 2017-05-14 NOTE — Progress Notes (Signed)
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 Dr. Jennings Books is his outpatient neurologist and he has been getting IVIG infusion at home 2 mg/kg over 4 days every 3 months maintenance regimen. His last dose was in October 2017 and he was due for a repeat dose in January 2018. However insurance denied home infusions and he is required to receive infusions at the infusion center and hence has been referred to [place] for the same. Patient's last visit with Dr. Manuella Ghazi was inJuly 2017. Patient states that he has been tolerating his IVIG well without any significant side effects. At one point in the past he did have a rash in his palms as well as developed a headache when it was given too fast. He has not had any side effects recently. He has responded well to IVIG and is now able to walk with a cane. When he was initially diagnosed in 2012 he was unable to lift his whole leg up and also had difficulty with his hands.Marland Kitchen EMG in April 2012 showed abnormal electrodiagnostic study consistent with generalized severe acquired and sensory motor polyneuropathy with neuropathic changes on the legs and all limbs. Lumbar puncture showed mildly basic protein was 0.6 normal IgG synthesis rate was -8.8 normal he has 0 on oligoclonal bands in his CSF. He was also seen at [facility] for second opinion and diagnosis was confirmed. He does have a port in place to receive IVIG.    Interval history- reports that  his extremity weakness has stabilized after IVIG. It is not better or worse  ECOG PS- 1 Pain scale- 0  Review of systems- Review of Systems  Constitutional: Negative for chills, fever, malaise/fatigue and weight loss.  HENT: Negative for congestion, ear discharge and nosebleeds.   Eyes: Negative for blurred vision.  Respiratory: Negative for cough, hemoptysis, sputum production, shortness of breath and wheezing.   Cardiovascular: Negative for chest pain, palpitations, orthopnea and claudication.  Gastrointestinal: Negative for abdominal pain, blood in stool, constipation, diarrhea, heartburn, melena, nausea and vomiting.  Genitourinary: Negative for dysuria, flank pain, frequency, hematuria and urgency.  Musculoskeletal: Negative for back pain, joint pain and myalgias.  Skin: Negative for rash.  Neurological: Negative for dizziness, tingling, focal weakness, seizures, weakness and headaches.  Endo/Heme/Allergies: Does not bruise/bleed easily.  Psychiatric/Behavioral: Negative for depression and suicidal ideas. The patient does not have insomnia.      Allergies  Allergen Reactions  . Penicillins Other (See Comments)    Unknown reaction from chilhood     Past Medical History:  Diagnosis Date  . CIDP (chronic inflammatory demyelinating polyneuropathy) ([place])   . CIDP (chronic inflammatory demyelinating polyneuropathy) ([place])   . Diabetes mellitus without complication ([place])   . Elevated lipids   . Hypertension   . Sleep apnea      Past Surgical History:  Procedure Laterality Date  . APPENDECTOMY    . COLONOSCOPY  2011   cleared for 5 yrs- Dr Allen Norris  . KNEE SURGERY Right  scope  . PORTACATH PLACEMENT Right 09/16/2015   Procedure: INSERTION PORT-A-CATH;  Surgeon: Leonie Green, MD;  Location: [facility] ORS;  Service: General;  Laterality: Right;  . TONSILLECTOMY      Social History   Socioeconomic History  . Marital status: Married    Spouse name: Not on file  . Number  of children: Not on file  . Years of education: Not on file  . Highest education level: Not on file  Social Needs  . Financial resource strain: Not on file  . Food insecurity - worry: Not on file  . Food insecurity - inability: Not on file  . Transportation needs - medical: Not on file  . Transportation needs - non-medical: Not on file  Occupational History  . Not on file  Tobacco Use  . Smoking status: Former Smoker    Last attempt to quit: 05/08/1978    Years since quitting: 39.0  . Smokeless tobacco: Never Used  Substance and Sexual Activity  . Alcohol use: Yes    Alcohol/week: 1.8 oz    Types: 3 Cans of beer per week    Comment: none since tuesday 5/9, usually a glass of wine or 2 at dinner daily  . Drug use: No  . Sexual activity: Yes  Other Topics Concern  . Not on file  Social History Narrative  . Not on file    Family History  Problem Relation Age of Onset  . Diabetes Father   . Heart disease Father   . Angina Mother   . Macular degeneration Mother   . Lung cancer Sister      Current Outpatient Medications:  .  amLODipine (NORVASC) 10 MG tablet, Take 1 tablet (10 mg total) by mouth daily., Disp: 90 tablet, Rfl: 1 .  carvedilol (COREG) 12.5 MG tablet, Take 2 tablets (25 mg total) by mouth 2 (two) times daily with a meal. TAKE 1 TABLET (12.5 MG TOTAL) BY MOUTH 2 (TWO) TIMES DAILY WITH A MEAL., Disp: 60 tablet, Rfl: 6 .  Immune Globulin 10% (GAMUNEX-C) 10 GM/100ML SOLN, Inject into the vein every 4 (four) months. 50 grams- Dr Manuella Ghazi, Disp: , Rfl:  .  Insulin Glargine (LANTUS SOLOSTAR) 100 UNIT/ML Solostar Pen, INJECT 52 UNITS UNDER THE SKIN AT BEDTIME- Dr Maretta Bees, Disp: 15 mL, Rfl: 6 .  Insulin Pen Needle (FIFTY50 PEN NEEDLES) 31G X 8 MM MISC, Use 4 (four) times daily. DX: E11.8, Disp: 100 each, Rfl: 6 .  loratadine (CLARITIN) 10 MG tablet, Take 1 tablet (10 mg total) by mouth daily., Disp: 30 tablet, Rfl: 11 .  losartan-hydrochlorothiazide (HYZAAR) 100-25 MG tablet, Take  1 tablet by mouth daily., Disp: 90 tablet, Rfl: 1 .  metFORMIN (GLUCOPHAGE) 500 MG tablet, Take 1 tablet (500 mg total) by mouth 2 (two) times daily., Disp: 60 tablet, Rfl: 6 .  NOVOLOG FLEXPEN 100 UNIT/ML FlexPen, , Disp: , Rfl:  .  potassium chloride 20 MEQ TBCR, Take 20 mEq by mouth daily., Disp: 7 tablet, Rfl: 0 .  rosuvastatin (CRESTOR) 20 MG tablet, Take 1 tablet (20 mg total) by mouth at bedtime. Dr Maretta Bees, Disp: 30 tablet, Rfl: 6 .  venlafaxine (EFFEXOR) 100 MG tablet, Take 1 tablet (100 mg total) by mouth 2 (two) times daily., Disp: 60 tablet, Rfl: 6  Physical exam:  Vitals:   05/14/17 0905  BP: (!) 190/99  Pulse: (!) 108  Resp: 18  Temp: 97.6 F (36.4 C)  TempSrc: Tympanic  Weight: 206 lb 3.9 oz (93.6  kg)   Physical Exam  Constitutional: He is oriented to person, place, and time and well-developed, well-nourished, and in no distress.  HENT:  Head: Normocephalic and atraumatic.  Eyes: EOM are normal. Pupils are equal, round, and reactive to light.  Neck: Normal range of motion.  Cardiovascular: Regular rhythm and normal heart sounds.  tachycardic  Pulmonary/Chest: Effort normal and breath sounds normal.  Abdominal: Soft. Bowel sounds are normal.  Neurological: He is alert and oriented to person, place, and time.  Skin: Skin is warm and dry.     CMP Latest Ref Rng & Units 01/22/2017  Glucose 65 - 99 mg/dL 190(H)  BUN 6 - 20 mg/dL 25(H)  Creatinine 0.61 - 1.24 mg/dL 1.36(H)  Sodium 135 - 145 mmol/L 136  Potassium 3.5 - 5.1 mmol/L 3.8  Chloride 101 - 111 mmol/L 92(L)  CO2 22 - 32 mmol/L 32  Calcium 8.9 - 10.3 mg/dL 9.7  Total Protein 6.5 - 8.1 g/dL -  Total Bilirubin 0.3 - 1.2 mg/dL -  Alkaline Phos 38 - 126 U/L -  AST 15 - 41 U/L -  ALT 17 - 63 U/L -   CBC Latest Ref Rng & Units 01/15/2017  WBC 3.8 - 10.6 K/uL 10.2  Hemoglobin 13.0 - 18.0 g/dL 15.0  Hematocrit 40.0 - 52.0 % 43.1  Platelets 150 - 440 K/uL 188      Assessment and plan- Patient is a 67 y.o.  male is here to receive IVIG for CIDP  Counts ok to proceed with next dose of IVIG today. He gets 50 gm daily for 4 days starting today.  he now gets his IVIG every 4 months. He has tolerated IVIG well without any significant side effects. rtc in 4 months with cbc, cmp  Hypokalemia- will start 20 meq K for a week and repeat bmp next week  CKD- followed by Dr. Ronnald Ramp. Currently stable  Uncontrolled HTN- on repeat check systoloic BP was 150/80.     Visit Diagnosis 1. Chronic inflammatory demyelinating polyneuritis (HCC)   2. Long-term current use of intravenous immunoglobulin (IVIG)      Dr. Randa Evens, MD, MPH [place] [facility] Pager- [phone] 05/14/2017 9:26 AM

## 2017-05-14 NOTE — Patient Instructions (Signed)

## 2017-05-14 NOTE — Addendum Note (Signed)
Addended by: Luella Cook on: 05/14/2017 09:40 AM   Modules accepted: Orders

## 2017-05-14 NOTE — Progress Notes (Signed)
Here for follow up BP 190/99 -will repeat  DR Janese Banks informed

## 2017-05-15 ENCOUNTER — Inpatient Hospital Stay: Payer: Medicare Other

## 2017-05-15 VITALS — BP 136/71 | HR 92 | Temp 98.6°F | Resp 20

## 2017-05-15 DIAGNOSIS — Z87891 Personal history of nicotine dependence: Secondary | ICD-10-CM | POA: Diagnosis not present

## 2017-05-15 DIAGNOSIS — E876 Hypokalemia: Secondary | ICD-10-CM | POA: Diagnosis not present

## 2017-05-15 DIAGNOSIS — N189 Chronic kidney disease, unspecified: Secondary | ICD-10-CM | POA: Diagnosis not present

## 2017-05-15 DIAGNOSIS — G6181 Chronic inflammatory demyelinating polyneuritis: Secondary | ICD-10-CM | POA: Diagnosis not present

## 2017-05-15 MED ORDER — ACETAMINOPHEN 325 MG PO TABS
650.0000 mg | ORAL_TABLET | Freq: Every day | ORAL | Status: DC
  Administered 2017-05-15: 650 mg via ORAL
  Filled 2017-05-15: qty 2

## 2017-05-15 MED ORDER — SODIUM CHLORIDE 0.9% FLUSH
10.0000 mL | INTRAVENOUS | Status: DC | PRN
  Administered 2017-05-15: 10 mL via INTRAVENOUS
  Filled 2017-05-15: qty 10

## 2017-05-15 MED ORDER — DIPHENHYDRAMINE HCL 25 MG PO CAPS
ORAL_CAPSULE | ORAL | Status: AC
  Filled 2017-05-15: qty 1

## 2017-05-15 MED ORDER — SODIUM CHLORIDE 0.9 % IV SOLN
Freq: Once | INTRAVENOUS | Status: AC
  Administered 2017-05-15: 09:00:00 via INTRAVENOUS
  Filled 2017-05-15: qty 1000

## 2017-05-15 MED ORDER — IMMUNE GLOBULIN (HUMAN) 20 GM/200ML IV SOLN
50.0000 g | Freq: Every day | INTRAVENOUS | Status: DC
  Filled 2017-05-15: qty 100

## 2017-05-15 MED ORDER — DIPHENHYDRAMINE HCL 25 MG PO CAPS
25.0000 mg | ORAL_CAPSULE | Freq: Every day | ORAL | Status: DC
  Administered 2017-05-15: 25 mg via ORAL
  Filled 2017-05-15: qty 1

## 2017-05-15 MED ORDER — IMMUNE GLOBULIN (HUMAN) 20 GM/200ML IV SOLN
50.0000 g | Freq: Every day | INTRAVENOUS | Status: DC
  Administered 2017-05-15: 50 g via INTRAVENOUS
  Filled 2017-05-15: qty 100

## 2017-05-15 MED ORDER — HEPARIN SOD (PORK) LOCK FLUSH 100 UNIT/ML IV SOLN
500.0000 [IU] | Freq: Once | INTRAVENOUS | Status: AC
  Administered 2017-05-15: 500 [IU] via INTRAVENOUS

## 2017-05-15 NOTE — Patient Instructions (Signed)

## 2017-05-16 ENCOUNTER — Inpatient Hospital Stay: Payer: Medicare Other

## 2017-05-16 VITALS — BP 173/93 | HR 102 | Temp 98.6°F | Resp 18

## 2017-05-16 DIAGNOSIS — G6181 Chronic inflammatory demyelinating polyneuritis: Secondary | ICD-10-CM | POA: Diagnosis not present

## 2017-05-16 DIAGNOSIS — Z87891 Personal history of nicotine dependence: Secondary | ICD-10-CM | POA: Diagnosis not present

## 2017-05-16 DIAGNOSIS — E876 Hypokalemia: Secondary | ICD-10-CM | POA: Diagnosis not present

## 2017-05-16 DIAGNOSIS — N189 Chronic kidney disease, unspecified: Secondary | ICD-10-CM | POA: Diagnosis not present

## 2017-05-16 MED ORDER — HEPARIN SOD (PORK) LOCK FLUSH 100 UNIT/ML IV SOLN
500.0000 [IU] | Freq: Once | INTRAVENOUS | Status: AC
  Administered 2017-05-16: 500 [IU] via INTRAVENOUS

## 2017-05-16 MED ORDER — DIPHENHYDRAMINE HCL 25 MG PO CAPS
25.0000 mg | ORAL_CAPSULE | Freq: Every day | ORAL | Status: DC
  Administered 2017-05-16: 25 mg via ORAL
  Filled 2017-05-16: qty 1

## 2017-05-16 MED ORDER — SODIUM CHLORIDE 0.9% FLUSH
10.0000 mL | INTRAVENOUS | Status: DC | PRN
  Administered 2017-05-16: 10 mL via INTRAVENOUS
  Filled 2017-05-16: qty 10

## 2017-05-16 MED ORDER — ACETAMINOPHEN 325 MG PO TABS
650.0000 mg | ORAL_TABLET | Freq: Every day | ORAL | Status: DC
  Administered 2017-05-16: 650 mg via ORAL
  Filled 2017-05-16: qty 2

## 2017-05-17 ENCOUNTER — Inpatient Hospital Stay: Payer: Medicare Other

## 2017-05-17 ENCOUNTER — Ambulatory Visit: Payer: Medicare Other

## 2017-05-17 VITALS — BP 149/85 | HR 94 | Temp 97.8°F | Resp 18

## 2017-05-17 DIAGNOSIS — G6181 Chronic inflammatory demyelinating polyneuritis: Secondary | ICD-10-CM

## 2017-05-17 DIAGNOSIS — E876 Hypokalemia: Secondary | ICD-10-CM | POA: Diagnosis not present

## 2017-05-17 DIAGNOSIS — N189 Chronic kidney disease, unspecified: Secondary | ICD-10-CM | POA: Diagnosis not present

## 2017-05-17 DIAGNOSIS — Z87891 Personal history of nicotine dependence: Secondary | ICD-10-CM | POA: Diagnosis not present

## 2017-05-17 MED ORDER — DIPHENHYDRAMINE HCL 25 MG PO CAPS
25.0000 mg | ORAL_CAPSULE | Freq: Every day | ORAL | Status: DC
  Administered 2017-05-17: 25 mg via ORAL
  Filled 2017-05-17: qty 1

## 2017-05-17 MED ORDER — IMMUNE GLOBULIN (HUMAN) 20 GM/200ML IV SOLN
50.0000 g | Freq: Every day | INTRAVENOUS | Status: DC
  Administered 2017-05-17: 09:00:00 50 g via INTRAVENOUS
  Filled 2017-05-17 (×2): qty 100

## 2017-05-17 MED ORDER — SODIUM CHLORIDE 0.9 % IV SOLN
Freq: Once | INTRAVENOUS | Status: AC
  Administered 2017-05-17: 09:00:00 via INTRAVENOUS
  Filled 2017-05-17: qty 1000

## 2017-05-17 MED ORDER — HEPARIN SOD (PORK) LOCK FLUSH 100 UNIT/ML IV SOLN
500.0000 [IU] | Freq: Once | INTRAVENOUS | Status: AC
  Administered 2017-05-17: 500 [IU] via INTRAVENOUS
  Filled 2017-05-17: qty 5

## 2017-05-17 MED ORDER — SODIUM CHLORIDE 0.9% FLUSH
10.0000 mL | INTRAVENOUS | Status: DC | PRN
  Administered 2017-05-17: 10 mL via INTRAVENOUS
  Filled 2017-05-17: qty 10

## 2017-05-17 MED ORDER — ACETAMINOPHEN 325 MG PO TABS
650.0000 mg | ORAL_TABLET | Freq: Every day | ORAL | Status: DC
  Administered 2017-05-17: 650 mg via ORAL
  Filled 2017-05-17: qty 2

## 2017-05-17 NOTE — Patient Instructions (Signed)
Immune Globulin Injection ?y l thu?c g? IMMUNE GLOBULIN gip phng ng?a ho?c lm gi?m m?c ?? nguy k?ch c?a m?t s? b?nh nhi?m trng ? nh?ng b?nh nhn c nguy c?. Thu?c ny ???c lm t? h?n h?p mu c?a nhi?u ng??i hi?n t?ng khc nhau. N ???c dng ?? ?i?u tr? cc v?n ?? c?a h? mi?n d?ch, ch?ng gi?m ti?u c?u, v h?i ch?ng [place]. Thu?c ny c th? ???c dng cho nh?ng m?c ?ch khc; hy h?i ng??i cung c?p d?ch v? y t? ho?c d??c s? c?a mnh, n?u qu v? c th?c m?c. (CC) NHN HI?U PH? BI?N: [place], BIVIGAM, Carimune, Carimune NF, Cuvitru, Flebogamma, Flebogamma DIF, GamaSTAN S/D, Gamimune N, Gammagard, Gammagard S/D, Gammaked, Gammaplex, Gammar-P IV, Gamunex, Gamunex-C, Hizentra, Iveegam, Iveegam EN, Octagam, Panglobulin, Panglobulin NF, Polygam S/D, Privigen, Sandoglobulin, Venoglobulin-S, Vigam, Vivaglobulin Ti c?n ph?i bo cho ng??i cung c?p d?ch v? y t? c?a mnh ?i?u g tr??c khi dng thu?c ny? H? c?n bi?t li?u qu v? hi?n c b?t k? tnh tr?ng no sau ?y hay khng: -b?nh ti?u ???ng -c r?t t ho?c khng c khng th? mi?n d?ch trong mu -b?nh tim -ti?n s? c mu ?ng c?c -ch?ng t?ng prolin huy?t -nhi?m trng mu, nhi?m trng huy?t -b?nh th?n -khi ?ang dng lo?i thu?c c th? lm thay ??i ch?c n?ng th?n - hy h?i bc s? ho?c chuyn vin y t? c?a mnh v? thu?c ny -pha?n ??ng b?t th???ng ho??c d? ??ng v??i globulin mi?n d?ch c?a ng??i, albumin, maltose, sucrose, ho?c polysorbate 80 -pha?n ??ng b?t th???ng ho??c di? ??ng v??i ca?c d??c ph?m kha?c, th?c ph?m, thu?c nhu?m, ho??c ch?t ba?o qua?n -?ang c thai ho??c ??nh co? thai -?ang cho con bu? Ti nn s? d?ng thu?c ny nh? th? no? Thu?c ny ?? tim vo b?p th?t ho?c ?? truy?n vo t?nh m?ch ho?c da. Thu?c ny th??ng ???c s? d?ng b?i chuyn vin y t? trong b?nh vi?n ho?c phng m?ch. Trong nh?ng tr??ng h?p hi?m, thu?c ny c th? ???c cho dng t?i nh. Qu v? s? ???c by cch s? d?ng thu?c ny. Hy s? d?ng ?ng nh? ? ???c ch? d?n. Dng  thu?c ny vo nh?ng kho?ng th?i gian ??u nhau. Khng ???c dng thu?c ny nhi?u l?n h?n ? ???c ch? d?n. Hy bn v?i bc s? nhi khoa c?a qu v? v? vi?c dng thu?c ny ? tr? em. C th? c?n ch?m [place] ??c bi?t. Qu li?u: N?u qu v? cho r?ng mnh ? dng qu nhi?u thu?c ny, th hy lin l?c v?i trung tm ki?m sot ch?t ??c ho?c phng c?p c?u ngay l?p t?c. L?U : Thu?c ny ch? dnh ring cho qu v?. Khng chia s? thu?c ny v?i nh?ng ng??i khc. N?u ti l? qun m?t li?u th sao? ?i?u quan tr?ng l khng nn b? l? li?u thu?c no. Hy lin l?c v?i bc s? ho?c [place] vin y t? c?a mnh, n?u qu v? khng th? gi? ?ng cu?c h?n khm. N?u qu v? t? tim thu?c cho mnh v qu v? l? qun m?t li?u thu?c, th hy tim li?u thu?c ? ngay khi c th?. N?u h?u nh? ? ??n gi? dng li?u thu?c k? ti?p, th ch? dng li?u thu?c k? ti?p ?Marland Kitchen Khng ???c dng li?u g?p ?i ho?c dng thm li?u. Nh?ng g c th? t??ng tc v?i thu?c ny? -aspirin v cc thu?c gi?ng aspirin -cisplatin -cyclosporine -m?t s? thu?c dng ?? tr? b?nh nhi?m trng, ch?ng h?n nh? acyclovir, adefovir, amphotericin B, bacitracin, cidofovir, foscarnet, ganciclovir, gentamicin,  pentamidine, vancomycin -Cc thu?c khng vim khng ph?i steroid (Non steroidal anti-inflamation drug - NSAID), cc thu?c gi?m ?au v khng vim, ch?ng h?n nh? ibuprofen, naproxen -pamidronate -cc thu?c ch?ng ng?a -acid zoledronic Danh sch ny c th? khng m t? ?? h?t cc t??ng tc c th? x?y ra. Hy ??a cho ng??i cung c?p d?ch v? y t? c?a mnh danh sch t?t c? cc thu?c, th?o d??c, cc thu?c khng c?n toa, ho?c cc ch? ph?m b? sung m qu v? dng. C?ng nn bo cho h? bi?t r?ng qu v? c ht thu?c, u?ng r??u, ho?c c s? d?ng ma ty tri php hay khng. Vi th? c th? t??ng tc v?i thu?c c?a qu v?. Ti c?n ph?i theo di ?i?u g trong khi dng thu?c ny? Qu v? s? ???c theo di ch?t ch? trong khi dng thu?c ny. Thu?c ny ???c lm t? h?n h?p mu c?a nhi?u ng??i hi?n t?ng khc nhau. Ch?  ph?m ny c th? truy?n b?nh nhi?m trng. Tuy nhin, nh?ng ng??i hi?n mu ? ???c sng l?c v? b?nh nhi?m trng v t?t c? cc ch? ph?m ? ???c xt nghi?m v? HIV v vim gan. Thu?c ny ? ???c x? l ?? gi?t h?u h?t ho?c t?t c? vi khu?n v virus. Hy th?o lu?n v?i bc s? v? cc r?i ro v l?i ch c?a thu?c ny. Khng ???c ch?ng ng?a t?i thi?u 14 ngy tr??c khi dng thu?c ny, ho?c cho ??n t?i thi?u 3 thng sau khi dng thu?c ny. Ti c th? nh?n th?y nh?ng tc d?ng ph? no khi dng thu?c ny? Nh?ng tc d?ng ph? qu v? c?n ph?i bo cho bc s? ho?c chuyn vin y t? cng s?m cng t?t: -cc ph?n ?ng d? ?ng, ch?ng h?n nh? da b? m?n ??, ng?a, n?i my ?ay, s?ng ? m?t, mi, ho?c l??i -cc v?n ?? v? h h?p -?au ho?c t?c ng?c -s?t ho?c ?n l?nh -?au ??u km bu?n i, i m?a -?au c? ho?c kh c? ??ng c? -?au khi chuy?n ??ng m?t -?au, s?ng ho?c c?m th?y nng ? chn -cc v?n ?? v? gi? th?ng b?ng, ni n?ng, ?i l?i -t?ng cn ??t ng?t -s?ng ? m?t c chn, bn chn, ho?c bn tay -kh ?i ti?u ho?c thay ??i l??ng n??c ti?u ???c bi ti?t Cc tc d?ng ph? khng c?n ph?i ch?m [place] y t? (hy bo cho bc s? ho?c chuyn vin y t?, n?u cc tc d?ng ph? ny ti?p di?n ho?c gy phi?n toi): -bu?n ng?, chng m?t -?? b?ng da -ra m? hi nhi?u h?n -v?p b? ? chn -?au v nh?c c? b?p -?au ? ch? tim Danh sch ny c th? khng m t? ?? h?t cc tc d?ng ph? c th? x?y ra. Xin g?i t?i bc s? c?a mnh ?? ???c c? v?n chuyn mn v? cc tc d?ng ph?Sander Nephew v? c th? t??ng trnh cc tc d?ng ph? cho FDA theo s? [phone]. Ti nn c?t gi? thu?c c?a mnh ? ?u? ?? ngoi t?m tay tr? em. Thu?c ny th??ng ???c dng trong b?nh vi?n ho?c phng m?ch v s? khng ???c c?t gi? t?i nh. Trong nh?ng tr??ng h?p hi?m, thu?c ny c th? ???c cho dng t?i nh. N?u qu v? ?ang dng thu?c ny t?i nh, th qu v? s? ???c ch? d?n cch c?t gi? thu?c. V?t b? t?t c? thu?c ch?a dng sau ngy h?t h?n in trn nhn thu?c ho?c bao thu?c. L?U : ?y l b?n tm t?t.  N c th? khng  bao hm t?t c? thng tin c th? c. N?u qu v? th?c m?c v? thu?c ny, xin trao ??i v?i bc s?, d??c s?, ho?c ng??i cung c?p d?ch v? y t? c?a mnh.  2018 [employer]vier/Gold Standard (2016-05-25 00:00:00)

## 2017-05-21 ENCOUNTER — Inpatient Hospital Stay: Payer: Medicare Other

## 2017-05-21 DIAGNOSIS — E1169 Type 2 diabetes mellitus with other specified complication: Secondary | ICD-10-CM | POA: Diagnosis not present

## 2017-05-21 DIAGNOSIS — E1142 Type 2 diabetes mellitus with diabetic polyneuropathy: Secondary | ICD-10-CM | POA: Diagnosis not present

## 2017-05-21 DIAGNOSIS — E785 Hyperlipidemia, unspecified: Secondary | ICD-10-CM | POA: Diagnosis not present

## 2017-05-21 DIAGNOSIS — Z794 Long term (current) use of insulin: Secondary | ICD-10-CM | POA: Diagnosis not present

## 2017-05-28 DIAGNOSIS — E1169 Type 2 diabetes mellitus with other specified complication: Secondary | ICD-10-CM | POA: Diagnosis not present

## 2017-05-28 DIAGNOSIS — I10 Essential (primary) hypertension: Secondary | ICD-10-CM | POA: Diagnosis not present

## 2017-05-28 DIAGNOSIS — E1159 Type 2 diabetes mellitus with other circulatory complications: Secondary | ICD-10-CM | POA: Diagnosis not present

## 2017-05-28 DIAGNOSIS — E1142 Type 2 diabetes mellitus with diabetic polyneuropathy: Secondary | ICD-10-CM | POA: Diagnosis not present

## 2017-05-28 DIAGNOSIS — E785 Hyperlipidemia, unspecified: Secondary | ICD-10-CM | POA: Diagnosis not present

## 2017-05-28 DIAGNOSIS — Z794 Long term (current) use of insulin: Secondary | ICD-10-CM | POA: Diagnosis not present

## 2017-06-03 ENCOUNTER — Other Ambulatory Visit: Payer: Self-pay | Admitting: Family Medicine

## 2017-06-03 DIAGNOSIS — E782 Mixed hyperlipidemia: Secondary | ICD-10-CM

## 2017-06-05 ENCOUNTER — Other Ambulatory Visit: Payer: Self-pay

## 2017-07-09 ENCOUNTER — Inpatient Hospital Stay: Payer: Medicare Other | Attending: Oncology

## 2017-07-09 DIAGNOSIS — Z452 Encounter for adjustment and management of vascular access device: Secondary | ICD-10-CM | POA: Insufficient documentation

## 2017-07-09 DIAGNOSIS — G6181 Chronic inflammatory demyelinating polyneuritis: Secondary | ICD-10-CM | POA: Diagnosis not present

## 2017-07-09 DIAGNOSIS — Z95828 Presence of other vascular implants and grafts: Secondary | ICD-10-CM

## 2017-07-09 MED ORDER — SODIUM CHLORIDE 0.9% FLUSH
10.0000 mL | INTRAVENOUS | Status: DC | PRN
  Administered 2017-07-09: 10 mL via INTRAVENOUS
  Filled 2017-07-09: qty 10

## 2017-07-09 MED ORDER — HEPARIN SOD (PORK) LOCK FLUSH 100 UNIT/ML IV SOLN
500.0000 [IU] | Freq: Once | INTRAVENOUS | Status: AC
  Administered 2017-07-09: 500 [IU] via INTRAVENOUS

## 2017-08-13 DIAGNOSIS — E119 Type 2 diabetes mellitus without complications: Secondary | ICD-10-CM | POA: Diagnosis not present

## 2017-09-03 ENCOUNTER — Other Ambulatory Visit: Payer: Self-pay | Admitting: Family Medicine

## 2017-09-03 DIAGNOSIS — E782 Mixed hyperlipidemia: Secondary | ICD-10-CM

## 2017-09-04 ENCOUNTER — Other Ambulatory Visit: Payer: Self-pay

## 2017-09-04 ENCOUNTER — Other Ambulatory Visit: Payer: Self-pay | Admitting: Family Medicine

## 2017-09-04 DIAGNOSIS — I10 Essential (primary) hypertension: Secondary | ICD-10-CM

## 2017-09-04 MED ORDER — ROSUVASTATIN CALCIUM 20 MG PO TABS: 20.0000 mg | ORAL_TABLET | Freq: Every day | ORAL | 0 refills | Status: DC

## 2017-09-04 MED ORDER — LOSARTAN POTASSIUM-HCTZ 100-25 MG PO TABS: 1.0000 | ORAL_TABLET | Freq: Every day | ORAL | 0 refills | Status: DC

## 2017-09-10 ENCOUNTER — Encounter: Payer: Self-pay | Admitting: Oncology

## 2017-09-10 ENCOUNTER — Inpatient Hospital Stay: Payer: Medicare Other

## 2017-09-10 ENCOUNTER — Inpatient Hospital Stay: Payer: Medicare Other | Attending: Oncology | Admitting: Oncology

## 2017-09-10 VITALS — BP 165/93 | HR 103 | Temp 97.6°F | Resp 18 | Ht 74.0 in | Wt 210.5 lb

## 2017-09-10 VITALS — BP 177/90 | HR 94 | Temp 97.7°F | Resp 18

## 2017-09-10 DIAGNOSIS — E876 Hypokalemia: Secondary | ICD-10-CM | POA: Diagnosis not present

## 2017-09-10 DIAGNOSIS — N189 Chronic kidney disease, unspecified: Secondary | ICD-10-CM

## 2017-09-10 DIAGNOSIS — G6181 Chronic inflammatory demyelinating polyneuritis: Secondary | ICD-10-CM

## 2017-09-10 DIAGNOSIS — E119 Type 2 diabetes mellitus without complications: Secondary | ICD-10-CM

## 2017-09-10 DIAGNOSIS — I10 Essential (primary) hypertension: Secondary | ICD-10-CM | POA: Diagnosis not present

## 2017-09-10 DIAGNOSIS — Z79899 Other long term (current) drug therapy: Secondary | ICD-10-CM

## 2017-09-10 DIAGNOSIS — Z87891 Personal history of nicotine dependence: Secondary | ICD-10-CM | POA: Diagnosis not present

## 2017-09-10 DIAGNOSIS — G6289 Other specified polyneuropathies: Secondary | ICD-10-CM

## 2017-09-10 LAB — CBC WITH DIFFERENTIAL/PLATELET
BASOS ABS: 0 10*3/uL (ref 0–0.1)
Basophils Relative: 1 %
EOS ABS: 0.2 10*3/uL (ref 0–0.7)
Eosinophils Relative: 2 %
HCT: 43.3 % (ref 40.0–52.0)
HEMOGLOBIN: 15.1 g/dL (ref 13.0–18.0)
Lymphocytes Relative: 34 %
Lymphs Abs: 2.6 10*3/uL (ref 1.0–3.6)
MCH: 33.8 pg (ref 26.0–34.0)
MCHC: 34.7 g/dL (ref 32.0–36.0)
MCV: 97.3 fL (ref 80.0–100.0)
Monocytes Absolute: 0.7 10*3/uL (ref 0.2–1.0)
Monocytes Relative: 9 %
NEUTROS PCT: 54 %
Neutro Abs: 4 10*3/uL (ref 1.4–6.5)
Platelets: 180 10*3/uL (ref 150–440)
RBC: 4.45 MIL/uL (ref 4.40–5.90)
RDW: 13.3 % (ref 11.5–14.5)
WBC: 7.5 10*3/uL (ref 3.8–10.6)

## 2017-09-10 LAB — COMPREHENSIVE METABOLIC PANEL
ALT: 26 U/L (ref 17–63)
AST: 30 U/L (ref 15–41)
Albumin: 4.4 g/dL (ref 3.5–5.0)
Alkaline Phosphatase: 62 U/L (ref 38–126)
Anion gap: 14 (ref 5–15)
BUN: 27 mg/dL — ABNORMAL HIGH (ref 6–20)
CO2: 27 mmol/L (ref 22–32)
CREATININE: 1.59 mg/dL — AB (ref 0.61–1.24)
Calcium: 9.8 mg/dL (ref 8.9–10.3)
Chloride: 91 mmol/L — ABNORMAL LOW (ref 101–111)
GFR, EST AFRICAN AMERICAN: 50 mL/min — AB (ref >60)
GFR, EST NON AFRICAN AMERICAN: 43 mL/min — AB (ref >60)
Glucose, Bld: 185 mg/dL — ABNORMAL HIGH (ref 65–99)
POTASSIUM: 3.2 mmol/L — AB (ref 3.5–5.1)
SODIUM: 132 mmol/L — AB (ref 135–145)
TOTAL PROTEIN: 8.5 g/dL — AB (ref 6.5–8.1)
Total Bilirubin: 0.7 mg/dL (ref 0.3–1.2)

## 2017-09-10 MED ORDER — POTASSIUM CHLORIDE CRYS ER 20 MEQ PO TBCR: 20.0000 meq | EXTENDED_RELEASE_TABLET | Freq: Two times a day (BID) | ORAL | 0 refills | Status: DC

## 2017-09-10 MED ORDER — ACETAMINOPHEN 325 MG PO TABS
650.0000 mg | ORAL_TABLET | Freq: Every day | ORAL | Status: DC
  Administered 2017-09-10: 650 mg via ORAL

## 2017-09-10 MED ORDER — HEPARIN SOD (PORK) LOCK FLUSH 100 UNIT/ML IV SOLN
500.0000 [IU] | Freq: Once | INTRAVENOUS | Status: AC
  Administered 2017-09-10: 500 [IU] via INTRAVENOUS

## 2017-09-10 MED ORDER — HEPARIN SOD (PORK) LOCK FLUSH 100 UNIT/ML IV SOLN
250.0000 [IU] | Freq: Once | INTRAVENOUS | Status: DC | PRN
  Filled 2017-09-10: qty 5

## 2017-09-10 MED ORDER — SODIUM CHLORIDE 0.9 % IV SOLN
Freq: Every day | INTRAVENOUS | Status: DC
  Administered 2017-09-10: 10:00:00 via INTRAVENOUS
  Filled 2017-09-10: qty 1000

## 2017-09-10 MED ORDER — DEXTROSE 5 % IV SOLN
INTRAVENOUS | Status: DC
  Administered 2017-09-10: 11:00:00 via INTRAVENOUS
  Filled 2017-09-10: qty 1000

## 2017-09-10 MED ORDER — SODIUM CHLORIDE 0.9% FLUSH
10.0000 mL | INTRAVENOUS | Status: DC | PRN
  Administered 2017-09-10: 10 mL
  Filled 2017-09-10: qty 10

## 2017-09-10 MED ORDER — DIPHENHYDRAMINE HCL 25 MG PO CAPS
25.0000 mg | ORAL_CAPSULE | Freq: Every day | ORAL | Status: DC
  Administered 2017-09-10: 25 mg via ORAL

## 2017-09-10 MED ORDER — IMMUNE GLOBULIN (HUMAN) 20 GM/200ML IV SOLN
50.0000 g | Freq: Every day | INTRAVENOUS | Status: AC
  Administered 2017-09-10: 50 g via INTRAVENOUS
  Filled 2017-09-10 (×4): qty 100

## 2017-09-10 NOTE — Progress Notes (Signed)
Left eye pink and irritated with scratchy

## 2017-09-10 NOTE — Progress Notes (Signed)
Hematology/Oncology Consult note [facility]  [phone] [phone]  Patient Care Team: Juline Patch, MD as PCP - General (Family Medicine) Vladimir Crofts, MD as Consulting Physician (Neurology)   Name of the patient: Phillip Fitzgerald  [id number]  11/28/1950   Date of visit: 09/10/17  Diagnosis- IVIG for CIDP  Chief complaint/ Reason for visit- on treatment assessment for IVIG for CIDP  Heme/Onc history: patient is a 67 year old male who was diagnosed with chronic inflammation in demyelinating polyneuropathy (CIDP) in 2012. Dr. Jennings Books is his outpatient neurologist and he has been getting IVIG infusion at home 2 mg/kg over 4 days every 3 months maintenance regimen. His last dose was in October 2017 and he was due for a repeat dose in January 2018. However insurance denied home infusions and he is required to receive infusions at the infusion center and hence has been referred to [place] for the same. Patient's last visit with Dr. Manuella Ghazi was inJuly 2017. Patient states that he has been tolerating his IVIG well without any significant side effects. At one point in the past he did have a rash in his palms as well as developed a headache when it was given too fast. He has not had any side effects recently. He has responded well to IVIG and is now able to walk with a cane. When he was initially diagnosed in 2012 he was unable to lift his whole leg up and also had difficulty with his hands.[facility] EMG in April 2012 showed abnormal electrodiagnostic study consistent with generalized severe acquired and sensory motor polyneuropathy with neuropathic changes on the legs and all limbs. Lumbar puncture showed mildly basic protein was 0.6 normal IgG synthesis rate was -8.8 normal he has 0 on oligoclonal bands in his CSF. He was also seen at [facility] for second opinion and diagnosis was confirmed. He does have a port in place to receive IVIG.  Interval history- he has  been feeling well. Denies any leg weakness. Does report some weakness in his hand grip. Denies any falls  ECOG PS- 1 Pain scale- 0   Review of systems- Review of Systems  Constitutional: Negative for chills, fever, malaise/fatigue and weight loss.  HENT: Negative for congestion, ear discharge and nosebleeds.   Eyes: Negative for blurred vision.  Respiratory: Negative for cough, hemoptysis, sputum production, shortness of breath and wheezing.   Cardiovascular: Negative for chest pain, palpitations, orthopnea and claudication.  Gastrointestinal: Negative for abdominal pain, blood in stool, constipation, diarrhea, heartburn, melena, nausea and vomiting.  Genitourinary: Negative for dysuria, flank pain, frequency, hematuria and urgency.  Musculoskeletal: Negative for back pain, joint pain and myalgias.  Skin: Negative for rash.  Neurological: Negative for dizziness, tingling, focal weakness, seizures, weakness and headaches.       Decreased hand grip strength  Endo/Heme/Allergies: Does not bruise/bleed easily.  Psychiatric/Behavioral: Negative for depression and suicidal ideas. The patient does not have insomnia.       Allergies  Allergen Reactions  . Penicillins Other (See Comments)    Unknown reaction from chilhood     Past Medical History:  Diagnosis Date  . CIDP (chronic inflammatory demyelinating polyneuropathy) ([place])   . CIDP (chronic inflammatory demyelinating polyneuropathy) ([place])   . Diabetes mellitus without complication ([place])   . Elevated lipids   . Hypertension   . Sleep apnea      Past Surgical History:  Procedure Laterality Date  . APPENDECTOMY    . COLONOSCOPY  2011  cleared for 5 yrs- Dr Allen Norris  . KNEE SURGERY Right    scope  . PORTACATH PLACEMENT Right 09/16/2015   Procedure: INSERTION PORT-A-CATH;  Surgeon: Leonie Green, MD;  Location: [facility] ORS;  Service: General;  Laterality: Right;  . TONSILLECTOMY      Social History   Socioeconomic History   . Marital status: Married    Spouse name: Not on file  . Number of children: Not on file  . Years of education: Not on file  . Highest education level: Not on file  Occupational History  . Not on file  Social Needs  . Financial resource strain: Not on file  . Food insecurity:    Worry: Not on file    Inability: Not on file  . Transportation needs:    Medical: Not on file    Non-medical: Not on file  Tobacco Use  . Smoking status: Former Smoker    Last attempt to quit: 05/08/1978    Years since quitting: 39.3  . Smokeless tobacco: Never Used  Substance and Sexual Activity  . Alcohol use: Yes    Alcohol/week: 1.8 oz    Types: 3 Cans of beer per week    Comment: none since tuesday 5/9, usually a glass of wine or 2 at dinner daily  . Drug use: No  . Sexual activity: Yes  Lifestyle  . Physical activity:    Days per week: Not on file    Minutes per session: Not on file  . Stress: Not on file  Relationships  . Social connections:    Talks on phone: Not on file    Gets together: Not on file    Attends religious service: Not on file    Active member of club or organization: Not on file    Attends meetings of clubs or organizations: Not on file    Relationship status: Not on file  . Intimate partner violence:    Fear of current or ex partner: Not on file    Emotionally abused: Not on file    Physically abused: Not on file    Forced sexual activity: Not on file  Other Topics Concern  . Not on file  Social History Narrative  . Not on file    Family History  Problem Relation Age of Onset  . Diabetes Father   . Heart disease Father   . Angina Mother   . Macular degeneration Mother   . Lung cancer Sister      Current Outpatient Medications:  .  amLODipine (NORVASC) 10 MG tablet, TAKE 1 TABLET BY MOUTH ONCE DAILY. NEED APPOINTMENT FOR FURTHER REFILLS, Disp: 30 tablet, Rfl: 0 .  carvedilol (COREG) 12.5 MG tablet, Take 2 tablets (25 mg total) by mouth 2 (two) times daily  with a meal. TAKE 1 TABLET (12.5 MG TOTAL) BY MOUTH 2 (TWO) TIMES DAILY WITH A MEAL., Disp: 60 tablet, Rfl: 6 .  Dulaglutide (TRULICITY) 9.92 EQ/6.8TM SOPN, Inject into the skin., Disp: , Rfl:  .  Immune Globulin 10% (GAMUNEX-C) 10 GM/100ML SOLN, Inject into the vein every 4 (four) months. 50 grams- Dr Manuella Ghazi, Disp: , Rfl:  .  Insulin Glargine (LANTUS SOLOSTAR) 100 UNIT/ML Solostar Pen, INJECT 52 UNITS UNDER THE SKIN AT BEDTIME- Dr Maretta Bees, Disp: 15 mL, Rfl: 6 .  Insulin Pen Needle (FIFTY50 PEN NEEDLES) 31G X 8 MM MISC, Use 4 (four) times daily. DX: E11.8, Disp: 100 each, Rfl: 6 .  loratadine (CLARITIN) 10 MG tablet, Take 1  tablet (10 mg total) by mouth daily., Disp: 30 tablet, Rfl: 11 .  losartan-hydrochlorothiazide (HYZAAR) 100-25 MG tablet, Take 1 tablet by mouth daily., Disp: 30 tablet, Rfl: 0 .  metFORMIN (GLUCOPHAGE) 500 MG tablet, Take 1 tablet (500 mg total) by mouth 2 (two) times daily., Disp: 60 tablet, Rfl: 6 .  NOVOLOG FLEXPEN 100 UNIT/ML FlexPen, , Disp: , Rfl:  .  Potassium Chloride ER 20 MEQ TBCR, Take 20 mEq by mouth daily., Disp: 7 tablet, Rfl: 0 .  rosuvastatin (CRESTOR) 20 MG tablet, Take 1 tablet (20 mg total) by mouth daily., Disp: 30 tablet, Rfl: 0 .  venlafaxine (EFFEXOR) 100 MG tablet, Take 1 tablet (100 mg total) by mouth 2 (two) times daily., Disp: 60 tablet, Rfl: 6  Physical exam:  Vitals:   09/10/17 0907  BP: (!) 165/93  Pulse: (!) 103  Resp: 18  Temp: 97.6 F (36.4 C)  TempSrc: Tympanic  Weight: 210 lb 8 oz (95.5 kg)  Height: 6\' 2"  (1.88 m)   Physical Exam  Constitutional: He is oriented to person, place, and time. He appears well-developed and well-nourished.  He ambulates with a cane  HENT:  Head: Normocephalic and atraumatic.  Eyes: Pupils are equal, round, and reactive to light. EOM are normal.  Neck: Normal range of motion.  Cardiovascular: Normal rate, regular rhythm and normal heart sounds.  Pulmonary/Chest: Effort normal and breath sounds normal.    Abdominal: Soft. Bowel sounds are normal.  Neurological: He is alert and oriented to person, place, and time.  No gross FND.   Skin: Skin is warm and dry.     CMP Latest Ref Rng & Units 09/10/2017  Glucose 65 - 99 mg/dL 185(H)  BUN 6 - 20 mg/dL 27(H)  Creatinine 0.61 - 1.24 mg/dL 1.59(H)  Sodium 135 - 145 mmol/L 132(L)  Potassium 3.5 - 5.1 mmol/L 3.2(L)  Chloride 101 - 111 mmol/L 91(L)  CO2 22 - 32 mmol/L 27  Calcium 8.9 - 10.3 mg/dL 9.8  Total Protein 6.5 - 8.1 g/dL 8.5(H)  Total Bilirubin 0.3 - 1.2 mg/dL 0.7  Alkaline Phos 38 - 126 U/L 62  AST 15 - 41 U/L 30  ALT 17 - 63 U/L 26   CBC Latest Ref Rng & Units 09/10/2017  WBC 3.8 - 10.6 K/uL 7.5  Hemoglobin 13.0 - 18.0 g/dL 15.1  Hematocrit 40.0 - 52.0 % 43.3  Platelets 150 - 440 K/uL 180      Assessment and plan- Patient is a 67 y.o. male with CIDP followed by neurology here to receive maintenance IVIG  1. Counts ok to proceed with IVIG next cycle to day. He gets 50 gm daily for 4 days. He will touch base with Dr. Manuella Ghazi and let us know about his next dose. He feels like he could go for 5 months instead of Q4 months. No issues with IVIG so far. Symptomatic improvement in symptoms noted  2. CKD- stable- he follows up with nephrology.   3. Hypokalemia: he will take 40 meq daily X4 days. Repeat bmp next week   Visit Diagnosis 1. Chronic inflammatory demyelinating polyneuropathy (HCC)   2. Long-term current use of intravenous immunoglobulin (IVIG)      Dr. Randa Evens, MD, MPH [facility] [phone] 09/10/2017 10:26 AM

## 2017-09-11 ENCOUNTER — Other Ambulatory Visit: Payer: Self-pay | Admitting: *Deleted

## 2017-09-11 ENCOUNTER — Inpatient Hospital Stay: Payer: Medicare Other

## 2017-09-11 VITALS — BP 163/77 | HR 97 | Temp 98.4°F | Resp 18 | Wt 210.0 lb

## 2017-09-11 DIAGNOSIS — G6289 Other specified polyneuropathies: Secondary | ICD-10-CM | POA: Diagnosis not present

## 2017-09-11 DIAGNOSIS — Z87891 Personal history of nicotine dependence: Secondary | ICD-10-CM | POA: Diagnosis not present

## 2017-09-11 DIAGNOSIS — G6181 Chronic inflammatory demyelinating polyneuritis: Secondary | ICD-10-CM | POA: Diagnosis not present

## 2017-09-11 DIAGNOSIS — E876 Hypokalemia: Secondary | ICD-10-CM | POA: Diagnosis not present

## 2017-09-11 DIAGNOSIS — N189 Chronic kidney disease, unspecified: Secondary | ICD-10-CM | POA: Diagnosis not present

## 2017-09-11 MED ORDER — IMMUNE GLOBULIN (HUMAN) 20 GM/200ML IV SOLN
50.0000 g | Freq: Every day | INTRAVENOUS | Status: DC
  Administered 2017-09-11: 50 g via INTRAVENOUS
  Filled 2017-09-11 (×3): qty 100

## 2017-09-11 MED ORDER — HEPARIN SOD (PORK) LOCK FLUSH 100 UNIT/ML IV SOLN
250.0000 [IU] | Freq: Once | INTRAVENOUS | Status: DC | PRN
Start: 2017-09-11 — End: 2017-09-11
  Administered 2017-09-11: 500 [IU]

## 2017-09-11 MED ORDER — DIPHENHYDRAMINE HCL 25 MG PO CAPS
25.0000 mg | ORAL_CAPSULE | Freq: Every day | ORAL | Status: DC
  Administered 2017-09-11: 25 mg via ORAL

## 2017-09-11 MED ORDER — DIPHENHYDRAMINE HCL 25 MG PO CAPS
ORAL_CAPSULE | ORAL | Status: AC
  Filled 2017-09-11: qty 1

## 2017-09-11 MED ORDER — ACETAMINOPHEN 325 MG PO TABS
ORAL_TABLET | ORAL | Status: AC
  Filled 2017-09-11: qty 2

## 2017-09-11 MED ORDER — DEXTROSE 5 % IV SOLN
INTRAVENOUS | Status: DC
  Administered 2017-09-11: 10:00:00 via INTRAVENOUS
  Filled 2017-09-11: qty 1000

## 2017-09-11 MED ORDER — SODIUM CHLORIDE 0.9% FLUSH
10.0000 mL | INTRAVENOUS | Status: DC | PRN
Start: 2017-09-11 — End: 2017-09-11
  Administered 2017-09-11: 10 mL
  Filled 2017-09-11: qty 10

## 2017-09-11 MED ORDER — ACETAMINOPHEN 325 MG PO TABS
650.0000 mg | ORAL_TABLET | Freq: Every day | ORAL | Status: DC
  Administered 2017-09-11: 650 mg via ORAL

## 2017-09-11 MED ORDER — SODIUM CHLORIDE 0.9 % IV SOLN
Freq: Every day | INTRAVENOUS | Status: DC
Start: 2017-09-11 — End: 2017-09-11
  Filled 2017-09-11: qty 1000

## 2017-09-12 ENCOUNTER — Encounter: Payer: Self-pay | Admitting: Oncology

## 2017-09-12 ENCOUNTER — Inpatient Hospital Stay: Payer: Medicare Other

## 2017-09-12 VITALS — BP 160/81 | HR 82 | Temp 98.4°F | Resp 18

## 2017-09-12 DIAGNOSIS — N189 Chronic kidney disease, unspecified: Secondary | ICD-10-CM | POA: Diagnosis not present

## 2017-09-12 DIAGNOSIS — G6289 Other specified polyneuropathies: Secondary | ICD-10-CM | POA: Diagnosis not present

## 2017-09-12 DIAGNOSIS — E876 Hypokalemia: Secondary | ICD-10-CM | POA: Diagnosis not present

## 2017-09-12 DIAGNOSIS — G6181 Chronic inflammatory demyelinating polyneuritis: Secondary | ICD-10-CM

## 2017-09-12 DIAGNOSIS — Z87891 Personal history of nicotine dependence: Secondary | ICD-10-CM | POA: Diagnosis not present

## 2017-09-12 MED ORDER — DEXTROSE 5 % IV SOLN
INTRAVENOUS | Status: DC
  Administered 2017-09-12: 10:00:00 via INTRAVENOUS
  Filled 2017-09-12: qty 1000

## 2017-09-12 MED ORDER — ACETAMINOPHEN 325 MG PO TABS
650.0000 mg | ORAL_TABLET | Freq: Every day | ORAL | Status: DC
  Administered 2017-09-12: 650 mg via ORAL

## 2017-09-12 MED ORDER — IMMUNE GLOBULIN (HUMAN) 20 GM/200ML IV SOLN
50.0000 g | Freq: Every day | INTRAVENOUS | Status: DC
  Administered 2017-09-12: 50 g via INTRAVENOUS
  Filled 2017-09-12 (×3): qty 100

## 2017-09-12 MED ORDER — HEPARIN SOD (PORK) LOCK FLUSH 100 UNIT/ML IV SOLN
250.0000 [IU] | Freq: Once | INTRAVENOUS | Status: DC | PRN
  Administered 2017-09-12: 500 [IU]
  Filled 2017-09-12: qty 5

## 2017-09-12 MED ORDER — DIPHENHYDRAMINE HCL 25 MG PO CAPS
25.0000 mg | ORAL_CAPSULE | Freq: Every day | ORAL | Status: DC
  Administered 2017-09-12: 25 mg via ORAL

## 2017-09-12 MED ORDER — ACETAMINOPHEN 325 MG PO TABS
ORAL_TABLET | ORAL | Status: AC
  Filled 2017-09-12: qty 2

## 2017-09-12 MED ORDER — SODIUM CHLORIDE 0.9% FLUSH
10.0000 mL | INTRAVENOUS | Status: DC | PRN
  Administered 2017-09-12: 10 mL
  Filled 2017-09-12: qty 10

## 2017-09-12 MED ORDER — DIPHENHYDRAMINE HCL 25 MG PO CAPS
ORAL_CAPSULE | ORAL | Status: AC
  Filled 2017-09-12: qty 1

## 2017-09-12 MED ORDER — SODIUM CHLORIDE 0.9 % IV SOLN
Freq: Every day | INTRAVENOUS | Status: DC
  Filled 2017-09-12: qty 1000

## 2017-09-13 ENCOUNTER — Inpatient Hospital Stay: Payer: Medicare Other

## 2017-09-13 VITALS — BP 162/84 | HR 85 | Temp 98.0°F | Resp 18 | Wt 210.0 lb

## 2017-09-13 DIAGNOSIS — N189 Chronic kidney disease, unspecified: Secondary | ICD-10-CM | POA: Diagnosis not present

## 2017-09-13 DIAGNOSIS — G6289 Other specified polyneuropathies: Secondary | ICD-10-CM | POA: Diagnosis not present

## 2017-09-13 DIAGNOSIS — E876 Hypokalemia: Secondary | ICD-10-CM | POA: Diagnosis not present

## 2017-09-13 DIAGNOSIS — G6181 Chronic inflammatory demyelinating polyneuritis: Secondary | ICD-10-CM

## 2017-09-13 DIAGNOSIS — Z87891 Personal history of nicotine dependence: Secondary | ICD-10-CM | POA: Diagnosis not present

## 2017-09-13 MED ORDER — DEXTROSE 5 % IV SOLN
INTRAVENOUS | Status: DC
  Administered 2017-09-13: 11:00:00 via INTRAVENOUS
  Filled 2017-09-13: qty 1000

## 2017-09-13 MED ORDER — ACETAMINOPHEN 325 MG PO TABS
ORAL_TABLET | ORAL | Status: AC
  Filled 2017-09-13: qty 2

## 2017-09-13 MED ORDER — DIPHENHYDRAMINE HCL 25 MG PO CAPS
ORAL_CAPSULE | ORAL | Status: AC
  Filled 2017-09-13: qty 1

## 2017-09-13 MED ORDER — SODIUM CHLORIDE 0.9% FLUSH
10.0000 mL | INTRAVENOUS | Status: DC | PRN
  Administered 2017-09-13: 10 mL
  Filled 2017-09-13: qty 10

## 2017-09-13 MED ORDER — DIPHENHYDRAMINE HCL 25 MG PO TABS
25.0000 mg | ORAL_TABLET | Freq: Every day | ORAL | Status: DC
  Administered 2017-09-13: 25 mg via ORAL
  Filled 2017-09-13: qty 1

## 2017-09-13 MED ORDER — IMMUNE GLOBULIN (HUMAN) 20 GM/200ML IV SOLN
50.0000 g | Freq: Every day | INTRAVENOUS | Status: DC
  Administered 2017-09-13: 50 g via INTRAVENOUS
  Filled 2017-09-13: qty 100

## 2017-09-13 MED ORDER — HEPARIN SOD (PORK) LOCK FLUSH 100 UNIT/ML IV SOLN
250.0000 [IU] | Freq: Once | INTRAVENOUS | Status: DC | PRN
  Administered 2017-09-13: 250 [IU]

## 2017-09-13 MED ORDER — ACETAMINOPHEN 325 MG PO TABS
650.0000 mg | ORAL_TABLET | Freq: Every day | ORAL | Status: DC
  Administered 2017-09-13: 650 mg via ORAL

## 2017-09-18 ENCOUNTER — Inpatient Hospital Stay: Payer: Medicare Other

## 2017-09-20 ENCOUNTER — Inpatient Hospital Stay: Payer: Medicare Other

## 2017-09-20 ENCOUNTER — Telehealth: Payer: Self-pay | Admitting: *Deleted

## 2017-09-20 ENCOUNTER — Other Ambulatory Visit: Payer: Self-pay | Admitting: *Deleted

## 2017-09-20 DIAGNOSIS — N189 Chronic kidney disease, unspecified: Secondary | ICD-10-CM | POA: Diagnosis not present

## 2017-09-20 DIAGNOSIS — Z87891 Personal history of nicotine dependence: Secondary | ICD-10-CM | POA: Diagnosis not present

## 2017-09-20 DIAGNOSIS — G6289 Other specified polyneuropathies: Secondary | ICD-10-CM | POA: Diagnosis not present

## 2017-09-20 DIAGNOSIS — E876 Hypokalemia: Secondary | ICD-10-CM | POA: Diagnosis not present

## 2017-09-20 DIAGNOSIS — G6181 Chronic inflammatory demyelinating polyneuritis: Secondary | ICD-10-CM | POA: Diagnosis not present

## 2017-09-20 LAB — POTASSIUM: POTASSIUM: 3.9 mmol/L (ref 3.5–5.1)

## 2017-09-20 NOTE — Telephone Encounter (Signed)
Pt repeat k was 3.9 and will not need any further potassium at this time. I had to leave message with voicemail about the info

## 2017-09-26 ENCOUNTER — Encounter: Payer: Self-pay | Admitting: Family Medicine

## 2017-09-26 ENCOUNTER — Ambulatory Visit (INDEPENDENT_AMBULATORY_CARE_PROVIDER_SITE_OTHER): Payer: Medicare Other | Admitting: Family Medicine

## 2017-09-26 VITALS — BP 150/82 | HR 80 | Ht 74.0 in | Wt 210.0 lb

## 2017-09-26 DIAGNOSIS — F331 Major depressive disorder, recurrent, moderate: Secondary | ICD-10-CM | POA: Diagnosis not present

## 2017-09-26 DIAGNOSIS — I10 Essential (primary) hypertension: Secondary | ICD-10-CM | POA: Diagnosis not present

## 2017-09-26 DIAGNOSIS — Z1211 Encounter for screening for malignant neoplasm of colon: Secondary | ICD-10-CM

## 2017-09-26 DIAGNOSIS — E782 Mixed hyperlipidemia: Secondary | ICD-10-CM

## 2017-09-26 DIAGNOSIS — J301 Allergic rhinitis due to pollen: Secondary | ICD-10-CM | POA: Diagnosis not present

## 2017-09-26 MED ORDER — LORATADINE 10 MG PO TABS: 10.0000 mg | ORAL_TABLET | Freq: Every day | ORAL | 11 refills | Status: DC

## 2017-09-26 MED ORDER — ROSUVASTATIN CALCIUM 20 MG PO TABS
20.0000 mg | ORAL_TABLET | Freq: Every day | ORAL | 1 refills | Status: DC
Start: 2017-09-26 — End: 2018-04-01

## 2017-09-26 MED ORDER — CARVEDILOL 12.5 MG PO TABS: 25.0000 mg | ORAL_TABLET | Freq: Two times a day (BID) | ORAL | 1 refills | Status: DC

## 2017-09-26 MED ORDER — FLUTICASONE PROPIONATE 50 MCG/ACT NA SUSP: 2.0000 | Freq: Every day | NASAL | 6 refills | Status: DC

## 2017-09-26 MED ORDER — VENLAFAXINE HCL 100 MG PO TABS: 100.0000 mg | ORAL_TABLET | Freq: Two times a day (BID) | ORAL | 1 refills | Status: DC

## 2017-09-26 MED ORDER — AMLODIPINE BESYLATE 10 MG PO TABS: ORAL_TABLET | ORAL | 1 refills | Status: DC

## 2017-09-26 MED ORDER — LOSARTAN POTASSIUM-HCTZ 100-25 MG PO TABS: 1.0000 | ORAL_TABLET | Freq: Every day | ORAL | 1 refills | Status: DC

## 2017-09-26 NOTE — Progress Notes (Signed)
Name: Phillip Fitzgerald   MRN: [id number]    DOB: [birth year]   Date:09/27/2017       Progress Note  Subjective  Chief Complaint  Chief Complaint  Patient presents with  . Allergic Rhinitis   . Hyperlipidemia  . Hypertension  . Depression    Hyperlipidemia  This is a chronic problem. The current episode started more than 1 year ago. The problem is controlled. He has no history of chronic renal disease, diabetes, hypothyroidism, liver disease, obesity or nephrotic syndrome. There are no known factors aggravating his hyperlipidemia. Pertinent negatives include no chest pain, focal sensory loss, focal weakness, leg pain, myalgias or shortness of breath. Current antihyperlipidemic treatment includes statins. The current treatment provides moderate improvement of lipids. There are no compliance problems.  Risk factors for coronary artery disease include diabetes mellitus, dyslipidemia, hypertension, male sex and post-menopausal.  Hypertension  This is a new problem. The current episode started more than 1 year ago. The problem is unchanged. The problem is controlled. Pertinent negatives include no anxiety, blurred vision, chest pain, headaches, malaise/fatigue, neck pain, orthopnea, palpitations, peripheral edema, PND, shortness of breath or sweats. There are no associated agents to hypertension. Risk factors for coronary artery disease include diabetes mellitus, dyslipidemia and male gender. Past treatments include angiotensin blockers, diuretics, calcium channel blockers and beta blockers. The current treatment provides moderate improvement. There are no compliance problems.  There is no history of angina, kidney disease, CAD/MI, CVA, heart failure, left ventricular hypertrophy, PVD or retinopathy. There is no history of chronic renal disease, a hypertension causing med or renovascular disease.  Depression         This is a chronic problem.  The current episode started more than 1 year ago.   The  problem occurs intermittently.  The problem has been gradually improving since onset.  Associated symptoms include no decreased concentration, no fatigue, no helplessness, no hopelessness, does not have insomnia, not irritable, no restlessness, no decreased interest, no appetite change, no body aches, no myalgias, no headaches, no indigestion, not sad and no suicidal ideas.     The symptoms are aggravated by nothing.  Past treatments include SNRIs - Serotonin and norepinephrine reuptake inhibitors.   Pertinent negatives include no hypothyroidism and no anxiety.   No problem-specific Assessment & Plan notes found for this encounter.   Past Medical History:  Diagnosis Date  . CIDP (chronic inflammatory demyelinating polyneuropathy) ([place])   . CIDP (chronic inflammatory demyelinating polyneuropathy) ([place])   . Diabetes mellitus without complication ([place])   . Elevated lipids   . Hypertension   . Sleep apnea     Past Surgical History:  Procedure Laterality Date  . APPENDECTOMY    . COLONOSCOPY  2011   cleared for 5 yrs- Dr Allen Norris  . KNEE SURGERY Right    scope  . PORTACATH PLACEMENT Right 09/16/2015   Procedure: INSERTION PORT-A-CATH;  Surgeon: Leonie Green, MD;  Location: [facility] ORS;  Service: General;  Laterality: Right;  . TONSILLECTOMY      Family History  Problem Relation Age of Onset  . Diabetes Father   . Heart disease Father   . Angina Mother   . Macular degeneration Mother   . Lung cancer Sister     Social History   Socioeconomic History  . Marital status: Married    Spouse name: Not on file  . Number of children: Not on file  . Years of education: Not on file  . Highest education level: Not  on file  Occupational History  . Not on file  Social Needs  . Financial resource strain: Not on file  . Food insecurity:    Worry: Not on file    Inability: Not on file  . Transportation needs:    Medical: Not on file    Non-medical: Not on file  Tobacco Use  . Smoking  status: Former Smoker    Last attempt to quit: 05/08/1978    Years since quitting: 39.4  . Smokeless tobacco: Never Used  Substance and Sexual Activity  . Alcohol use: Yes    Alcohol/week: 1.8 oz    Types: 3 Cans of beer per week    Comment: none since tuesday 5/9, usually a glass of wine or 2 at dinner daily  . Drug use: No  . Sexual activity: Yes  Lifestyle  . Physical activity:    Days per week: Not on file    Minutes per session: Not on file  . Stress: Not on file  Relationships  . Social connections:    Talks on phone: Not on file    Gets together: Not on file    Attends religious service: Not on file    Active member of club or organization: Not on file    Attends meetings of clubs or organizations: Not on file    Relationship status: Not on file  . Intimate partner violence:    Fear of current or ex partner: Not on file    Emotionally abused: Not on file    Physically abused: Not on file    Forced sexual activity: Not on file  Other Topics Concern  . Not on file  Social History Narrative  . Not on file    Allergies  Allergen Reactions  . Penicillins Other (See Comments)    Unknown reaction from chilhood    Outpatient Medications Prior to Visit  Medication Sig Dispense Refill  . Dulaglutide (TRULICITY) 1.60 VP/7.1GG SOPN Inject into the skin once a week. Dr Honor Junes    . Immune Globulin 10% (GAMUNEX-C) 10 GM/100ML SOLN Inject into the vein every 4 (four) months. 50 grams- Dr Manuella Ghazi    . Insulin Glargine (LANTUS SOLOSTAR) 100 UNIT/ML Solostar Pen INJECT 52 UNITS UNDER THE SKIN AT BEDTIME- Dr Maretta Bees 15 mL 6  . Insulin Pen Needle (FIFTY50 PEN NEEDLES) 31G X 8 MM MISC Use 4 (four) times daily. DX: E11.8 100 each 6  . metFORMIN (GLUCOPHAGE) 500 MG tablet Take 1 tablet (500 mg total) by mouth 2 (two) times daily. (Patient taking differently: Take 500 mg by mouth 2 (two) times daily. Dr Honor Junes) 60 tablet 6  . NOVOLOG FLEXPEN 100 UNIT/ML FlexPen Dr Honor Junes    .  amLODipine (NORVASC) 10 MG tablet TAKE 1 TABLET BY MOUTH ONCE DAILY. NEED APPOINTMENT FOR FURTHER REFILLS 30 tablet 0  . carvedilol (COREG) 12.5 MG tablet Take 2 tablets (25 mg total) by mouth 2 (two) times daily with a meal. TAKE 1 TABLET (12.5 MG TOTAL) BY MOUTH 2 (TWO) TIMES DAILY WITH A MEAL. 60 tablet 6  . loratadine (CLARITIN) 10 MG tablet Take 1 tablet (10 mg total) by mouth daily. 30 tablet 11  . losartan-hydrochlorothiazide (HYZAAR) 100-25 MG tablet Take 1 tablet by mouth daily. 30 tablet 0  . rosuvastatin (CRESTOR) 20 MG tablet Take 1 tablet (20 mg total) by mouth daily. 30 tablet 0  . venlafaxine (EFFEXOR) 100 MG tablet Take 1 tablet (100 mg total) by mouth 2 (two) times daily. [place]  tablet 6  . Potassium Chloride ER 20 MEQ TBCR Take 20 mEq by mouth daily. 7 tablet 0  . potassium chloride SA (K-DUR,KLOR-CON) 20 MEQ tablet Take 1 tablet (20 mEq total) by mouth 2 (two) times daily for 4 days. 8 tablet 0   No facility-administered medications prior to visit.     Review of Systems  Constitutional: Negative for appetite change, chills, fatigue, fever, malaise/fatigue and weight loss.  HENT: Negative for ear discharge, ear pain and sore throat.   Eyes: Negative for blurred vision.  Respiratory: Negative for cough, sputum production, shortness of breath and wheezing.   Cardiovascular: Negative for chest pain, palpitations, orthopnea, leg swelling and PND.  Gastrointestinal: Negative for abdominal pain, blood in stool, constipation, diarrhea, heartburn, melena and nausea.  Genitourinary: Negative for dysuria, frequency, hematuria and urgency.  Musculoskeletal: Negative for back pain, joint pain, myalgias and neck pain.  Skin: Negative for rash.  Neurological: Negative for dizziness, tingling, sensory change, focal weakness and headaches.  Endo/Heme/Allergies: Negative for environmental allergies and polydipsia. Does not bruise/bleed easily.  Psychiatric/Behavioral: Positive for depression.  Negative for decreased concentration and suicidal ideas. The patient is not nervous/anxious and does not have insomnia.      Objective  Vitals:   09/26/17 1037  BP: (!) 150/82  Pulse: 80  Weight: 210 lb (95.3 kg)  Height: 6\' 2"  (1.88 m)    Physical Exam  Constitutional: He is oriented to person, place, and time. He is not irritable.  HENT:  Head: Normocephalic.  Right Ear: External ear normal.  Left Ear: External ear normal.  Nose: Nose normal.  Mouth/Throat: Oropharynx is clear and moist.  Eyes: Pupils are equal, round, and reactive to light. Conjunctivae and EOM are normal. Right eye exhibits no discharge. Left eye exhibits no discharge. No scleral icterus.  Neck: Normal range of motion. Neck supple. No JVD present. No tracheal deviation present. No thyromegaly present.  Cardiovascular: Normal rate, regular rhythm, normal heart sounds and intact distal pulses. Exam reveals no gallop and no friction rub.  No murmur heard. Pulmonary/Chest: Breath sounds normal. No respiratory distress. He has no wheezes. He has no rales.  Abdominal: Soft. Bowel sounds are normal. He exhibits no mass. There is no hepatosplenomegaly. There is no tenderness. There is no rebound, no guarding and no CVA tenderness.  Musculoskeletal: Normal range of motion. He exhibits no edema or tenderness.  Lymphadenopathy:    He has no cervical adenopathy.  Neurological: He is alert and oriented to person, place, and time. He has normal strength and normal reflexes. No cranial nerve deficit.  Skin: Skin is warm. No rash noted.  Nursing note and vitals reviewed.     Assessment & Plan  Problem List Items Addressed This Visit      Cardiovascular and Mediastinum   Essential hypertension - Primary   Relevant Medications   amLODipine (NORVASC) 10 MG tablet   losartan-hydrochlorothiazide (HYZAAR) 100-25 MG tablet   carvedilol (COREG) 12.5 MG tablet   rosuvastatin (CRESTOR) 20 MG tablet     Other    Hyperlipidemia   Relevant Medications   amLODipine (NORVASC) 10 MG tablet   losartan-hydrochlorothiazide (HYZAAR) 100-25 MG tablet   carvedilol (COREG) 12.5 MG tablet   rosuvastatin (CRESTOR) 20 MG tablet   Moderate episode of recurrent major depressive disorder (HCC)   Relevant Medications   venlafaxine (EFFEXOR) 100 MG tablet    Other Visit Diagnoses    Chronic seasonal allergic rhinitis due to pollen  not controlled/ add fluticisone   Relevant Medications   loratadine (CLARITIN) 10 MG tablet   fluticasone (FLONASE) 50 MCG/ACT nasal spray   Colon cancer screening       Relevant Orders   Ambulatory referral to Gastroenterology      Meds ordered this encounter  Medications  . loratadine (CLARITIN) 10 MG tablet    Sig: Take 1 tablet (10 mg total) by mouth daily.    Dispense:  30 tablet    Refill:  11  . amLODipine (NORVASC) 10 MG tablet    Sig: TAKE 1 TABLET BY MOUTH ONCE DAILY. NEED APPOINTMENT FOR FURTHER REFILLS    Dispense:  90 tablet    Refill:  1  . losartan-hydrochlorothiazide (HYZAAR) 100-25 MG tablet    Sig: Take 1 tablet by mouth daily.    Dispense:  90 tablet    Refill:  1    Only 30- needs appt  . carvedilol (COREG) 12.5 MG tablet    Sig: Take 2 tablets (25 mg total) by mouth 2 (two) times daily with a meal. TAKE 1 TABLET (12.5 MG TOTAL) BY MOUTH 2 (TWO) TIMES DAILY WITH A MEAL.    Dispense:  180 tablet    Refill:  1  . rosuvastatin (CRESTOR) 20 MG tablet    Sig: Take 1 tablet (20 mg total) by mouth daily.    Dispense:  90 tablet    Refill:  1    Time for appt  . venlafaxine (EFFEXOR) 100 MG tablet    Sig: Take 1 tablet (100 mg total) by mouth 2 (two) times daily.    Dispense:  180 tablet    Refill:  1  . fluticasone (FLONASE) 50 MCG/ACT nasal spray    Sig: Place 2 sprays into both nostrils daily.    Dispense:  16 g    Refill:  6      Dr. Otilio Miu [facility]  09/27/17

## 2017-09-27 ENCOUNTER — Other Ambulatory Visit: Payer: Self-pay

## 2017-09-27 DIAGNOSIS — Z1211 Encounter for screening for malignant neoplasm of colon: Secondary | ICD-10-CM

## 2017-10-12 NOTE — Discharge Instructions (Signed)
General Anesthesia, Adult, Care After °These instructions provide you with information about caring for yourself after your procedure. Your health care provider may also give you more specific instructions. Your treatment has been planned according to current medical practices, but problems sometimes occur. Call your health care provider if you have any problems or questions after your procedure. °What can I expect after the procedure? °After the procedure, it is common to have: °· Vomiting. °· A sore throat. °· Mental slowness. ° °It is common to feel: °· Nauseous. °· Cold or shivery. °· Sleepy. °· Tired. °· Sore or achy, even in parts of your body where you did not have surgery. ° °Follow these instructions at home: °For at least 24 hours after the procedure: °· Do not: °? Participate in activities where you could fall or become injured. °? Drive. °? Use heavy machinery. °? Drink alcohol. °? Take sleeping pills or medicines that cause drowsiness. °? Make important decisions or sign legal documents. °? Take care of children on your own. °· Rest. °Eating and drinking °· If you vomit, drink water, juice, or soup when you can drink without vomiting. °· Drink enough fluid to keep your urine clear or pale yellow. °· Make sure you have little or no nausea before eating solid foods. °· Follow the diet recommended by your health care provider. °General instructions °· Have a responsible adult stay with you until you are awake and alert. °· Return to your normal activities as told by your health care provider. Ask your health care provider what activities are safe for you. °· Take over-the-counter and prescription medicines only as told by your health care provider. °· If you smoke, do not smoke without supervision. °· Keep all follow-up visits as told by your health care provider. This is important. °Contact a health care provider if: °· You continue to have nausea or vomiting at home, and medicines are not helpful. °· You  cannot drink fluids or start eating again. °· You cannot urinate after 8-12 hours. °· You develop a skin rash. °· You have fever. °· You have increasing redness at the site of your procedure. °Get help right away if: °· You have difficulty breathing. °· You have chest pain. °· You have unexpected bleeding. °· You feel that you are having a life-threatening or urgent problem. °This information is not intended to replace advice given to you by your health care provider. Make sure you discuss any questions you have with your health care provider. °Document Released: 07/31/2000 Document Revised: 09/27/2015 Document Reviewed: 04/08/2015 °[employer]vier Interactive Patient Education © 2018 [employer] ° °

## 2017-10-15 ENCOUNTER — Ambulatory Visit: Payer: Medicare Other | Admitting: Anesthesiology

## 2017-10-15 ENCOUNTER — Ambulatory Visit
Admission: RE | Admit: 2017-10-15 | Discharge: 2017-10-15 | Disposition: A | Discharge status: Home / Self Care | Payer: Medicare Other | Source: Ambulatory Visit | Attending: Gastroenterology | Admitting: Gastroenterology

## 2017-10-15 ENCOUNTER — Encounter
Admission: RE | Disposition: A | Discharge status: Home / Self Care | Payer: Self-pay | Source: Ambulatory Visit | Attending: Gastroenterology

## 2017-10-15 DIAGNOSIS — Z87891 Personal history of nicotine dependence: Secondary | ICD-10-CM | POA: Diagnosis not present

## 2017-10-15 DIAGNOSIS — I10 Essential (primary) hypertension: Secondary | ICD-10-CM | POA: Insufficient documentation

## 2017-10-15 DIAGNOSIS — Z1211 Encounter for screening for malignant neoplasm of colon: Secondary | ICD-10-CM

## 2017-10-15 DIAGNOSIS — Z79899 Other long term (current) drug therapy: Secondary | ICD-10-CM | POA: Insufficient documentation

## 2017-10-15 DIAGNOSIS — G4730 Sleep apnea, unspecified: Secondary | ICD-10-CM | POA: Insufficient documentation

## 2017-10-15 DIAGNOSIS — Z794 Long term (current) use of insulin: Secondary | ICD-10-CM | POA: Insufficient documentation

## 2017-10-15 DIAGNOSIS — D125 Benign neoplasm of sigmoid colon: Secondary | ICD-10-CM

## 2017-10-15 DIAGNOSIS — G6181 Chronic inflammatory demyelinating polyneuritis: Secondary | ICD-10-CM | POA: Insufficient documentation

## 2017-10-15 DIAGNOSIS — E119 Type 2 diabetes mellitus without complications: Secondary | ICD-10-CM | POA: Diagnosis not present

## 2017-10-15 DIAGNOSIS — Z86010 Personal history of colonic polyps: Secondary | ICD-10-CM | POA: Diagnosis not present

## 2017-10-15 DIAGNOSIS — Z880 Allergy status to penicillin: Secondary | ICD-10-CM | POA: Insufficient documentation

## 2017-10-15 DIAGNOSIS — D123 Benign neoplasm of transverse colon: Secondary | ICD-10-CM | POA: Diagnosis not present

## 2017-10-15 DIAGNOSIS — K635 Polyp of colon: Secondary | ICD-10-CM

## 2017-10-15 HISTORY — PX: POLYPECTOMY: SHX5525

## 2017-10-15 HISTORY — PX: COLONOSCOPY WITH PROPOFOL: SHX5780

## 2017-10-15 LAB — GLUCOSE, CAPILLARY
GLUCOSE-CAPILLARY: 174 mg/dL — AB (ref 65–99)
GLUCOSE-CAPILLARY: 158 mg/dL — AB (ref 65–99)

## 2017-10-15 SURGERY — COLONOSCOPY WITH PROPOFOL
Anesthesia: General | Site: Rectum | Wound class: Contaminated

## 2017-10-15 MED ORDER — LACTATED RINGERS IV SOLN
INTRAVENOUS | Status: DC
  Administered 2017-10-15: 08:00:00 via INTRAVENOUS

## 2017-10-15 MED ORDER — LACTATED RINGERS IV SOLN: 10.0000 mL/h | INTRAVENOUS | Status: DC

## 2017-10-15 MED ORDER — PROPOFOL 10 MG/ML IV BOLUS
INTRAVENOUS | Status: DC | PRN
  Administered 2017-10-15: 40 mg via INTRAVENOUS
  Administered 2017-10-15: 100 mg via INTRAVENOUS
  Administered 2017-10-15 (×2): 20 mg via INTRAVENOUS
  Administered 2017-10-15: 10 mg via INTRAVENOUS
  Administered 2017-10-15: 20 mg via INTRAVENOUS
  Administered 2017-10-15 (×2): 10 mg via INTRAVENOUS
  Administered 2017-10-15: 30 mg via INTRAVENOUS
  Administered 2017-10-15 (×3): 20 mg via INTRAVENOUS
  Administered 2017-10-15: 50 mg via INTRAVENOUS

## 2017-10-15 MED ORDER — LIDOCAINE HCL (CARDIAC) PF 100 MG/5ML IV SOSY
PREFILLED_SYRINGE | INTRAVENOUS | Status: DC | PRN
  Administered 2017-10-15: 40 mg via INTRAVENOUS

## 2017-10-15 MED ORDER — STERILE WATER FOR IRRIGATION IR SOLN
Status: DC | PRN
  Administered 2017-10-15: .5 mL

## 2017-10-15 MED ORDER — ACETAMINOPHEN 160 MG/5ML PO SOLN: 325.0000 mg | Freq: Once | ORAL | Status: DC

## 2017-10-15 MED ORDER — ACETAMINOPHEN 325 MG PO TABS: 325.0000 mg | ORAL_TABLET | Freq: Once | ORAL | Status: DC

## 2017-10-15 SURGICAL SUPPLY — 11 items
CANISTER SUCT 1200ML W/VALVE (MISCELLANEOUS) ×4 IMPLANT
CLIP HMST 235XBRD CATH ROT (MISCELLANEOUS) ×2 IMPLANT
CLIP RESOLUTION 360 11X235 (MISCELLANEOUS) ×2
ELECT REM PT RETURN 9FT ADLT (ELECTROSURGICAL) ×4
ELECTRODE REM PT RTRN 9FT ADLT (ELECTROSURGICAL) ×2 IMPLANT
GOWN CVR UNV OPN BCK APRN NK (MISCELLANEOUS) ×4 IMPLANT
GOWN ISOL THUMB LOOP REG UNIV (MISCELLANEOUS) ×4
KIT ENDO PROCEDURE OLY (KITS) ×4 IMPLANT
SNARE SHORT THROW 13M SML OVAL (MISCELLANEOUS) ×4 IMPLANT
TRAP ETRAP POLY (MISCELLANEOUS) ×4 IMPLANT
WATER STERILE IRR 250ML POUR (IV SOLUTION) ×4 IMPLANT

## 2017-10-15 NOTE — Anesthesia Postprocedure Evaluation (Signed)
Anesthesia Post Note  Patient: Phillip Fitzgerald  Procedure(s) Performed: COLONOSCOPY WITH PROPOFOL (N/A Rectum) POLYPECTOMY (Rectum)  Patient location during evaluation: PACU Anesthesia Type: General Level of consciousness: awake and alert and oriented Pain management: satisfactory to patient Vital Signs Assessment: post-procedure vital signs reviewed and stable Respiratory status: spontaneous breathing, nonlabored ventilation and respiratory function stable Cardiovascular status: blood pressure returned to baseline and stable Postop Assessment: Adequate PO intake and No signs of nausea or vomiting Anesthetic complications: no    Raliegh Ip

## 2017-10-15 NOTE — Transfer of Care (Signed)
Immediate Anesthesia Transfer of Care Note  Patient: [place] FASSNACHT  Procedure(s) Performed: COLONOSCOPY WITH PROPOFOL (N/A Rectum) POLYPECTOMY (Rectum)  Patient Location: PACU  Anesthesia Type: General  Level of Consciousness: awake, alert  and patient cooperative  Airway and Oxygen Therapy: Patient Spontanous Breathing and Patient connected to supplemental oxygen  Post-op Assessment: Post-op Vital signs reviewed, Patient's Cardiovascular Status Stable, Respiratory Function Stable, Patent Airway and No signs of Nausea or vomiting  Post-op Vital Signs: Reviewed and stable  Complications: No apparent anesthesia complications

## 2017-10-15 NOTE — Anesthesia Preprocedure Evaluation (Signed)
Anesthesia Evaluation  Patient identified by MRN, date of birth, ID band Patient awake    Reviewed: Allergy & Precautions, H&P , NPO status , Patient's Chart, lab work & pertinent test results  Airway Mallampati: III  TM Distance: >3 FB Neck ROM: full    Dental no notable dental hx.    Pulmonary sleep apnea , former smoker,    Pulmonary exam normal breath sounds clear to auscultation       Cardiovascular hypertension, Normal cardiovascular exam Rhythm:regular Rate:Normal     Neuro/Psych  Neuromuscular disease    GI/Hepatic   Endo/Other  diabetes  Renal/GU      Musculoskeletal   Abdominal   Peds  Hematology   Anesthesia Other Findings   Reproductive/Obstetrics                             Anesthesia Physical Anesthesia Plan  ASA: III  Anesthesia Plan: General   Post-op Pain Management:    Induction: Intravenous  PONV Risk Score and Plan: 2  Airway Management Planned: Natural Airway  Additional Equipment:   Intra-op Plan:   Post-operative Plan:   Informed Consent: I have reviewed the patients History and Physical, chart, labs and discussed the procedure including the risks, benefits and alternatives for the proposed anesthesia with the patient or authorized representative who has indicated his/her understanding and acceptance.     Plan Discussed with: CRNA  Anesthesia Plan Comments:         Anesthesia Quick Evaluation

## 2017-10-15 NOTE — H&P (Signed)
Phillip Lame, MD [facility] [address], [place], [place] [address] Phone: [phone] Fax : [phone]  Primary Care Physician:  Juline Patch, MD Primary Gastroenterologist:  Dr. Allen Norris  Pre-Procedure History & Physical: HPI:  Phillip Fitzgerald is a 67 y.o. male is here for a screening colonoscopy.   Past Medical History:  Diagnosis Date  . CIDP (chronic inflammatory demyelinating polyneuropathy) (HCC)    left leg weakness  . CIDP (chronic inflammatory demyelinating polyneuropathy) ([place])   . Diabetes mellitus without complication ([place])   . Elevated lipids   . Hypertension   . Sleep apnea    CPAP    Past Surgical History:  Procedure Laterality Date  . APPENDECTOMY    . COLONOSCOPY  2011   cleared for 5 yrs- Dr Allen Norris  . KNEE SURGERY Right    scope  . PORTACATH PLACEMENT Right 09/16/2015   Procedure: INSERTION PORT-A-CATH;  Surgeon: Leonie Green, MD;  Location: [facility] ORS;  Service: General;  Laterality: Right;  . TONSILLECTOMY      Prior to Admission medications   Medication Sig Start Date End Date Taking? Authorizing Provider  amLODipine (NORVASC) 10 MG tablet TAKE 1 TABLET BY MOUTH ONCE DAILY. NEED APPOINTMENT FOR FURTHER REFILLS 09/26/17  Yes Juline Patch, MD  carvedilol (COREG) 12.5 MG tablet Take 2 tablets (25 mg total) by mouth 2 (two) times daily with a meal. TAKE 1 TABLET (12.5 MG TOTAL) BY MOUTH 2 (TWO) TIMES DAILY WITH A MEAL. 09/26/17  Yes Juline Patch, MD  fluticasone (FLONASE) 50 MCG/ACT nasal spray Place 2 sprays into both nostrils daily. 09/26/17  Yes Juline Patch, MD  Insulin Glargine (LANTUS SOLOSTAR) 100 UNIT/ML Solostar Pen INJECT 52 UNITS UNDER THE SKIN AT BEDTIME- Dr Maretta Bees 01/16/17  Yes Juline Patch, MD  Insulin Pen Needle (FIFTY50 PEN NEEDLES) 31G X 8 MM MISC Use 4 (four) times daily. DX: E11.8 12/07/16  Yes Juline Patch, MD  loratadine (CLARITIN) 10 MG tablet Take 1 tablet (10 mg total) by mouth daily. 09/26/17  Yes Juline Patch,  MD  losartan-hydrochlorothiazide (HYZAAR) 100-25 MG tablet Take 1 tablet by mouth daily. 09/26/17  Yes Juline Patch, MD  metFORMIN (GLUCOPHAGE) 500 MG tablet Take 1 tablet (500 mg total) by mouth 2 (two) times daily. Patient taking differently: Take 500 mg by mouth 2 (two) times daily. Dr Honor Junes 12/07/16  Yes Juline Patch, MD  NOVOLOG FLEXPEN 100 UNIT/ML FlexPen Dr Honor Junes 01/12/17  Yes [provider]  rosuvastatin (CRESTOR) 20 MG tablet Take 1 tablet (20 mg total) by mouth daily. 09/26/17  Yes Juline Patch, MD  venlafaxine (EFFEXOR) 100 MG tablet Take 1 tablet (100 mg total) by mouth 2 (two) times daily. 09/26/17  Yes Juline Patch, MD  Dulaglutide (TRULICITY) 5.28 UX/3.2GM SOPN Inject into the skin once a week. Dr Honor Junes    [provider]  Immune Globulin 10% (GAMUNEX-C) 10 GM/100ML SOLN Inject into the vein every 4 (four) months. 50 grams- Dr Manuella Ghazi    [provider]    Allergies as of 09/27/2017 - Review Complete 09/26/2017  Allergen Reaction Noted  . Penicillins Other (See Comments) 09/10/2014    Family History  Problem Relation Age of Onset  . Diabetes Father   . Heart disease Father   . Angina Mother   . Macular degeneration Mother   . Lung cancer Sister     Social History   Socioeconomic History  . Marital status:  Married    Spouse name: Not on file  . Number of children: Not on file  . Years of education: Not on file  . Highest education level: Not on file  Occupational History  . Not on file  Social Needs  . Financial resource strain: Not on file  . Food insecurity:    Worry: Not on file    Inability: Not on file  . Transportation needs:    Medical: Not on file    Non-medical: Not on file  Tobacco Use  . Smoking status: Former Smoker    Last attempt to quit: 05/08/1978    Years since quitting: 39.4  . Smokeless tobacco: Never Used  Substance and Sexual Activity  . Alcohol use: Yes    Alcohol/week: 1.8 oz    Types: 3  Cans of beer per week    Comment: none since tuesday 5/9, usually a glass of wine or 2 at dinner daily  . Drug use: No  . Sexual activity: Yes  Lifestyle  . Physical activity:    Days per week: Not on file    Minutes per session: Not on file  . Stress: Not on file  Relationships  . Social connections:    Talks on phone: Not on file    Gets together: Not on file    Attends religious service: Not on file    Active member of club or organization: Not on file    Attends meetings of clubs or organizations: Not on file    Relationship status: Not on file  . Intimate partner violence:    Fear of current or ex partner: Not on file    Emotionally abused: Not on file    Physically abused: Not on file    Forced sexual activity: Not on file  Other Topics Concern  . Not on file  Social History Narrative  . Not on file    Review of Systems: See HPI, otherwise negative ROS  Physical Exam: BP (!) 190/97   Pulse (!) 106   Temp 98.8 F (37.1 C) (Temporal)   Resp 16   Ht 6\' 2"  (1.88 m)   Wt 206 lb (93.4 kg)   SpO2 98%   BMI 26.45 kg/m  General:   Alert,  pleasant and cooperative in NAD Head:  Normocephalic and atraumatic. Neck:  Supple; no masses or thyromegaly. Lungs:  Clear throughout to auscultation.    Heart:  Regular rate and rhythm. Abdomen:  Soft, nontender and nondistended. Normal bowel sounds, without guarding, and without rebound.   Neurologic:  Alert and  oriented x4;  grossly normal neurologically.  Impression/Plan: Phillip Fitzgerald is now here to undergo a screening colonoscopy.  Risks, benefits, and alternatives regarding colonoscopy have been reviewed with the patient.  Questions have been answered.  All parties agreeable.

## 2017-10-15 NOTE — Op Note (Signed)
[facility] Gastroenterology Patient Name: Phillip Fitzgerald Procedure Date: 10/15/2017 8:00 AM MRN: [id number] Account #: [id number] Date of Birth: 05-27-1950 Admit Type: Outpatient Age: 67 Room: [facility] OR ROOM 01 Gender: Male Note Status: Finalized Procedure:            Colonoscopy Indications:          Screening for colorectal malignant neoplasm Providers:            [name] MD, MD Medicines:            Propofol per Anesthesia Complications:        No immediate complications. Procedure:            Pre-Anesthesia Assessment:                       - Prior to the procedure, a History and Physical was                        performed, and patient medications and allergies were                        reviewed. The patient's tolerance of previous                        anesthesia was also reviewed. The risks and benefits of                        the procedure and the sedation options and risks were                        discussed with the patient. All questions were                        answered, and informed consent was obtained. Prior                        Anticoagulants: The patient has taken no previous                        anticoagulant or antiplatelet agents. ASA Grade                        Assessment: II - A patient with mild systemic disease.                        After reviewing the risks and benefits, the patient was                        deemed in satisfactory condition to undergo the                        procedure.                       After obtaining informed consent, the colonoscope was                        passed under direct vision. Throughout the procedure,                        the patient's blood pressure,  pulse, and oxygen                        saturations were monitored continuously. The [place] [id number]) was introduced through the                        anus and advanced to the the cecum,  identified by                        appendiceal orifice and ileocecal valve. The                        colonoscopy was performed without difficulty. The                        patient tolerated the procedure well. The quality of                        the bowel preparation was excellent. Findings:      The perianal and digital rectal examinations were normal.      Two pedunculated polyps were found in the hepatic flexure. The polyps       were 4 to 7 mm in size. These polyps were removed with a hot snare.       Resection and retrieval were complete. To prevent bleeding       post-intervention, a hemostatic clip was successfully placed. There was       no bleeding at the end of the procedure.      Two sessile polyps were found in the transverse colon. The polyps were 3       to 7 mm in size. These polyps were removed with a cold snare. Resection       and retrieval were complete.      A 6 mm polyp was found in the sigmoid colon. The polyp was sessile. The       polyp was removed with a cold snare. Resection and retrieval were       complete. Impression:           - Two 4 to 7 mm polyps at the hepatic flexure, removed                        with a hot snare. Resected and retrieved.                       - Two 3 to 7 mm polyps in the transverse colon, removed                        with a cold snare. Resected and retrieved.                       - One 6 mm polyp in the sigmoid colon, removed with a                        cold snare. Resected and retrieved. Recommendation:       - Discharge patient to home.                       -  Resume previous diet.                       - Continue present medications.                       - Await pathology results.                       - Repeat colonoscopy in 5 years if polyp adenoma and 10                        years if hyperplastic Procedure Code(s):    --- Professional ---                       [phone], Colonoscopy, flexible; with removal of  tumor(s),                        polyp(s), or other lesion(s) by snare technique Diagnosis Code(s):    --- Professional ---                       Z12.11, Encounter for screening for malignant neoplasm                        of colon                       D12.3, Benign neoplasm of transverse colon (hepatic                        flexure or splenic flexure)                       D12.5, Benign neoplasm of sigmoid colon CPT copyright 2017 American Medical Association. All rights reserved. The codes documented in this report are preliminary and upon coder review may  be revised to meet current compliance requirements. [name] MD, MD 10/15/2017 8:30:57 AM This report has been signed electronically. Number of Addenda: 0 Note Initiated On: 10/15/2017 8:00 AM Scope Withdrawal Time: 0 hours 16 minutes 17 seconds  Total Procedure Duration: 0 hours 23 minutes 4 seconds       [facility]

## 2017-10-15 NOTE — Anesthesia Procedure Notes (Signed)
Procedure Name: MAC Date/Time: 10/15/2017 7:58 AM Performed by: Janna Arch, CRNA Pre-anesthesia Checklist: Patient identified, Emergency Drugs available, Suction available and Patient being monitored Patient Re-evaluated:Patient Re-evaluated prior to induction Oxygen Delivery Method: Nasal cannula

## 2017-10-16 ENCOUNTER — Encounter: Payer: Self-pay | Admitting: Gastroenterology

## 2017-10-17 LAB — SURGICAL PATHOLOGY

## 2017-10-22 ENCOUNTER — Encounter: Payer: Self-pay | Admitting: Family Medicine

## 2017-10-22 ENCOUNTER — Encounter: Payer: Self-pay | Admitting: Gastroenterology

## 2017-11-12 ENCOUNTER — Ambulatory Visit (INDEPENDENT_AMBULATORY_CARE_PROVIDER_SITE_OTHER): Payer: Medicare Other

## 2017-11-12 ENCOUNTER — Inpatient Hospital Stay: Payer: Medicare Other | Attending: Oncology

## 2017-11-12 DIAGNOSIS — Z452 Encounter for adjustment and management of vascular access device: Secondary | ICD-10-CM | POA: Diagnosis not present

## 2017-11-12 DIAGNOSIS — Z23 Encounter for immunization: Secondary | ICD-10-CM | POA: Diagnosis not present

## 2017-11-12 DIAGNOSIS — G6181 Chronic inflammatory demyelinating polyneuritis: Secondary | ICD-10-CM | POA: Insufficient documentation

## 2017-11-12 DIAGNOSIS — Z95828 Presence of other vascular implants and grafts: Secondary | ICD-10-CM

## 2017-11-12 MED ORDER — SODIUM CHLORIDE 0.9% FLUSH
10.0000 mL | INTRAVENOUS | Status: DC | PRN
  Administered 2017-11-12: 10 mL via INTRAVENOUS
  Filled 2017-11-12: qty 10

## 2017-11-12 MED ORDER — HEPARIN SOD (PORK) LOCK FLUSH 100 UNIT/ML IV SOLN
500.0000 [IU] | Freq: Once | INTRAVENOUS | Status: AC
  Administered 2017-11-12: 500 [IU] via INTRAVENOUS

## 2017-11-23 ENCOUNTER — Ambulatory Visit
Admission: EM | Admit: 2017-11-23 | Discharge: 2017-11-23 | Disposition: A | Discharge status: Home / Self Care | Payer: Medicare Other | Attending: Family Medicine | Admitting: Family Medicine

## 2017-11-23 ENCOUNTER — Other Ambulatory Visit: Payer: Self-pay

## 2017-11-23 ENCOUNTER — Encounter: Payer: Self-pay | Admitting: Emergency Medicine

## 2017-11-23 DIAGNOSIS — Z791 Long term (current) use of non-steroidal anti-inflammatories (NSAID): Secondary | ICD-10-CM | POA: Diagnosis not present

## 2017-11-23 DIAGNOSIS — M545 Low back pain: Secondary | ICD-10-CM | POA: Diagnosis not present

## 2017-11-23 DIAGNOSIS — G6181 Chronic inflammatory demyelinating polyneuritis: Secondary | ICD-10-CM | POA: Insufficient documentation

## 2017-11-23 DIAGNOSIS — I10 Essential (primary) hypertension: Secondary | ICD-10-CM | POA: Diagnosis not present

## 2017-11-23 DIAGNOSIS — G4730 Sleep apnea, unspecified: Secondary | ICD-10-CM | POA: Diagnosis not present

## 2017-11-23 DIAGNOSIS — Z87891 Personal history of nicotine dependence: Secondary | ICD-10-CM | POA: Insufficient documentation

## 2017-11-23 DIAGNOSIS — Z83518 Family history of other specified eye disorder: Secondary | ICD-10-CM | POA: Insufficient documentation

## 2017-11-23 DIAGNOSIS — Z79899 Other long term (current) drug therapy: Secondary | ICD-10-CM | POA: Insufficient documentation

## 2017-11-23 DIAGNOSIS — Z801 Family history of malignant neoplasm of trachea, bronchus and lung: Secondary | ICD-10-CM | POA: Diagnosis not present

## 2017-11-23 DIAGNOSIS — Z9889 Other specified postprocedural states: Secondary | ICD-10-CM | POA: Insufficient documentation

## 2017-11-23 DIAGNOSIS — R0781 Pleurodynia: Secondary | ICD-10-CM

## 2017-11-23 DIAGNOSIS — Z833 Family history of diabetes mellitus: Secondary | ICD-10-CM | POA: Insufficient documentation

## 2017-11-23 DIAGNOSIS — Z87442 Personal history of urinary calculi: Secondary | ICD-10-CM | POA: Diagnosis not present

## 2017-11-23 DIAGNOSIS — Z8249 Family history of ischemic heart disease and other diseases of the circulatory system: Secondary | ICD-10-CM | POA: Diagnosis not present

## 2017-11-23 DIAGNOSIS — E119 Type 2 diabetes mellitus without complications: Secondary | ICD-10-CM | POA: Insufficient documentation

## 2017-11-23 DIAGNOSIS — Z794 Long term (current) use of insulin: Secondary | ICD-10-CM | POA: Insufficient documentation

## 2017-11-23 DIAGNOSIS — E785 Hyperlipidemia, unspecified: Secondary | ICD-10-CM | POA: Diagnosis not present

## 2017-11-23 DIAGNOSIS — Z880 Allergy status to penicillin: Secondary | ICD-10-CM | POA: Diagnosis not present

## 2017-11-23 LAB — URINALYSIS, COMPLETE (UACMP) WITH MICROSCOPIC
BILIRUBIN URINE: NEGATIVE
GLUCOSE, UA: 500 mg/dL — AB
Ketones, ur: 15 mg/dL — AB
LEUKOCYTES UA: NEGATIVE
NITRITE: NEGATIVE
PROTEIN: 100 mg/dL — AB
Specific Gravity, Urine: 1.025 (ref 1.005–1.030)
pH: 5.5 (ref 5.0–8.0)

## 2017-11-23 MED ORDER — TIZANIDINE HCL 4 MG PO CAPS
4.0000 mg | ORAL_CAPSULE | Freq: Three times a day (TID) | ORAL | 0 refills | Status: DC
Start: 2017-11-23 — End: 2018-01-28

## 2017-11-23 MED ORDER — KETOROLAC TROMETHAMINE 60 MG/2ML IM SOLN
30.0000 mg | Freq: Once | INTRAMUSCULAR | Status: AC
  Administered 2017-11-23: 30 mg via INTRAMUSCULAR

## 2017-11-23 MED ORDER — MELOXICAM 7.5 MG PO TABS: 7.5000 mg | ORAL_TABLET | Freq: Every day | ORAL | 0 refills | Status: DC

## 2017-11-23 NOTE — ED Provider Notes (Signed)
[facility]    CSN: [id number] Arrival date & time: 11/23/17  1147     History   Chief Complaint Chief Complaint  Patient presents with  . Back Pain    HPI Phillip Fitzgerald is a 67 y.o. male.   HPI  67 year old male presents with right lower back pain indicating lower rib area radiation around the flank towards the front.   Started Yesterday morning but worsened overnight.  He denies any urinary symptoms.  He has had a kidney stone once in the past.  Initially denied any injury to the area but stated that  Night before the onset of his Sx., he was attempting to get out of bed when he twisted and his 85 pound dog jumped on him.  He did not feel pain at first until this morning.  There is no radicular component to the pain.  He has no incontinence.        Past Medical History:  Diagnosis Date  . CIDP (chronic inflammatory demyelinating polyneuropathy) (HCC)    left leg weakness  . CIDP (chronic inflammatory demyelinating polyneuropathy) ([place])   . Diabetes mellitus without complication ([place])   . Elevated lipids   . Hypertension   . Sleep apnea    CPAP    Patient Active Problem List   Diagnosis Date Noted  . Special screening for malignant neoplasms, colon   . Benign neoplasm of transverse colon   . Polyp of sigmoid colon   . Moderate episode of recurrent major depressive disorder ([place]) 09/26/2017  . Hyperlipidemia 12/28/2014  . Adiposity 11/02/2014  . Chronic inflammatory demyelinating polyneuritis ([place]) 09/10/2013  . Diabetes ([place]) 09/10/2013  . Essential hypertension 09/10/2013  . Obstructive apnea 09/10/2013  . Allergic rhinitis, seasonal 09/10/2013    Past Surgical History:  Procedure Laterality Date  . APPENDECTOMY    . COLONOSCOPY  2011   cleared for 5 yrs- Dr Allen Norris  . COLONOSCOPY WITH PROPOFOL N/A 10/15/2017   Procedure: COLONOSCOPY WITH PROPOFOL;  Surgeon: [name], MD;  Location: [place];  Service: Endoscopy;  Laterality:  N/A;  Diabetic and sleep apnea  . KNEE SURGERY Right    scope  . POLYPECTOMY  10/15/2017   Procedure: POLYPECTOMY;  Surgeon: [name], MD;  Location: [place];  Service: Endoscopy;;  . PORTACATH PLACEMENT Right 09/16/2015   Procedure: INSERTION PORT-A-CATH;  Surgeon: Leonie Green, MD;  Location: [facility] ORS;  Service: General;  Laterality: Right;  . TONSILLECTOMY         Home Medications    Prior to Admission medications   Medication Sig Start Date End Date Taking? Authorizing Provider  amLODipine (NORVASC) 10 MG tablet TAKE 1 TABLET BY MOUTH ONCE DAILY. NEED APPOINTMENT FOR FURTHER REFILLS 09/26/17  Yes Juline Patch, MD  carvedilol (COREG) 12.5 MG tablet Take 2 tablets (25 mg total) by mouth 2 (two) times daily with a meal. TAKE 1 TABLET (12.5 MG TOTAL) BY MOUTH 2 (TWO) TIMES DAILY WITH A MEAL. 09/26/17  Yes Juline Patch, MD  Dulaglutide (TRULICITY) 3.79 KW/4.0XB SOPN Inject into the skin once a week. Dr Honor Junes   Yes [provider]  fluticasone Asencion Islam) 50 MCG/ACT nasal spray Place 2 sprays into both nostrils daily. 09/26/17  Yes Juline Patch, MD  Immune Globulin 10% (GAMUNEX-C) 10 GM/100ML SOLN Inject into the vein every 4 (four) months. 50 grams- Dr Manuella Ghazi   Yes [provider]  Insulin Glargine (LANTUS SOLOSTAR) 100 UNIT/ML Solostar Pen INJECT  52 UNITS UNDER THE SKIN AT BEDTIME- Dr Maretta Bees 01/16/17  Yes Juline Patch, MD  Insulin Pen Needle (FIFTY50 PEN NEEDLES) 31G X 8 MM MISC Use 4 (four) times daily. DX: E11.8 12/07/16  Yes Juline Patch, MD  loratadine (CLARITIN) 10 MG tablet Take 1 tablet (10 mg total) by mouth daily. 09/26/17  Yes Juline Patch, MD  losartan-hydrochlorothiazide (HYZAAR) 100-25 MG tablet Take 1 tablet by mouth daily. 09/26/17  Yes Juline Patch, MD  metFORMIN (GLUCOPHAGE) 500 MG tablet Take 1 tablet (500 mg total) by mouth 2 (two) times daily. Patient taking differently: Take 500 mg by mouth 2 (two) times daily. Dr  Honor Junes 12/07/16  Yes Juline Patch, MD  NOVOLOG FLEXPEN 100 UNIT/ML FlexPen Dr Honor Junes 01/12/17  Yes [provider]  rosuvastatin (CRESTOR) 20 MG tablet Take 1 tablet (20 mg total) by mouth daily. 09/26/17  Yes Juline Patch, MD  venlafaxine (EFFEXOR) 100 MG tablet Take 1 tablet (100 mg total) by mouth 2 (two) times daily. 09/26/17  Yes Juline Patch, MD  meloxicam (MOBIC) 7.5 MG tablet Take 1 tablet (7.5 mg total) by mouth daily. 11/23/17   Lorin Picket, PA-C  tiZANidine (ZANAFLEX) 4 MG capsule Take 1 capsule (4 mg total) by mouth 3 (three) times daily. 11/23/17   Lorin Picket, PA-C    Family History Family History  Problem Relation Age of Onset  . Diabetes Father   . Heart disease Father   . Angina Mother   . Macular degeneration Mother   . Lung cancer Sister     Social History Social History   Tobacco Use  . Smoking status: Former Smoker    Last attempt to quit: 05/08/1978    Years since quitting: 39.5  . Smokeless tobacco: Never Used  Substance Use Topics  . Alcohol use: Yes    Alcohol/week: 1.8 oz    Types: 3 Cans of beer per week    Comment: none since tuesday 5/9, usually a glass of wine or 2 at dinner daily  . Drug use: No     Allergies   Penicillins   Review of Systems Review of Systems  Constitutional: Positive for activity change. Negative for appetite change, chills, fatigue and fever.  Musculoskeletal: Positive for back pain.  All other systems reviewed and are negative.    Physical Exam Triage Vital Signs ED Triage Vitals  Enc Vitals Group     BP 11/23/17 1255 (!) 195/103     Pulse Rate 11/23/17 1255 (!) 102     Resp 11/23/17 1255 16     Temp 11/23/17 1255 98.5 F (36.9 C)     Temp Source 11/23/17 1255 Oral     SpO2 11/23/17 1255 100 %     Weight 11/23/17 1253 217 lb (98.4 kg)     Height 11/23/17 1253 6\' 2"  (1.88 m)     Head Circumference --      Peak Flow --      Pain Score 11/23/17 1253 7     Pain Loc --      Pain  Edu? --      Excl. in [place]? --    No data found.  Updated Vital Signs BP (!) 195/103 (BP Location: Right Arm)   Pulse (!) 102   Temp 98.5 F (36.9 C) (Oral)   Resp 16   Ht 6\' 2"  (1.88 m)   Wt 217 lb (98.4 kg)   SpO2 100%   BMI  27.86 kg/m   Visual Acuity Right Eye Distance:   Left Eye Distance:   Bilateral Distance:    Right Eye Near:   Left Eye Near:    Bilateral Near:     Physical Exam  Constitutional: He is oriented to person, place, and time. He appears well-developed and well-nourished. No distress.  HENT:  Head: Normocephalic.  Eyes: Pupils are equal, round, and reactive to light. Right eye exhibits no discharge. Left eye exhibits no discharge.  Neck: Normal range of motion.  Pulmonary/Chest: Effort normal and breath sounds normal. He exhibits tenderness.  Tenderness is over the eighth ninth 10th rib level and extends laterally and anteriorly along the rib to the anterior axillary line.  Is no crepitus.  The there is no induration.  She is able to inspire rate early deeply with good air exchange.  No crackles rhonchi or  wheezes are present.  Musculoskeletal: He exhibits tenderness.  Examination of the lumbar spine shows mild flattening of the lordotic curve.  Patient is able to forward flex to the level of his hands to his ankles.  Returning to an upright posture is more painful than forward flexion.  Lateral flexion to the left causes right sided low back and rib pain.  He has lateral flexion to the right is much less painful and more fluid.  Neurological: He is alert and oriented to person, place, and time.  Skin: Skin is warm and dry. He is not diaphoretic.  Psychiatric: He has a normal mood and affect. His behavior is normal. Judgment and thought content normal.  Nursing note and vitals reviewed.    UC Treatments / Results  Labs (all labs ordered are listed, but only abnormal results are displayed) Labs Reviewed  URINALYSIS, COMPLETE (UACMP) WITH MICROSCOPIC -  Abnormal; Notable for the following components:      Result Value   Glucose, UA 500 (*)    Hgb urine dipstick TRACE (*)    Ketones, ur 15 (*)    Protein, ur 100 (*)    Bacteria, UA FEW (*)    All other components within normal limits    EKG None  Radiology No results found.  Procedures Procedures (including critical care time)  Medications Ordered in UC Medications  ketorolac (TORADOL) injection 30 mg (30 mg Intramuscular Given 11/23/17 1330)   The patient's pain decreased from a 8/10 to a 4/10 after the Toradol injection Initial Impression / Assessment and Plan / UC Course  I have reviewed the triage vital signs and the nursing notes.  Pertinent labs & imaging results that were available during my care of the patient were reviewed by me and considered in my medical decision making (see chart for details).     Plan: 1. Test/x-ray results and diagnosis reviewed with patient 2. rx as per orders; risks, benefits, potential side effects reviewed with patient 3. Recommend supportive treatment with rest and symptom avoidance.  Since main pain seems to be of his ribs, This is likely from a strain or contusion when the dog jumped on him when he was twisting to get out of bed.  He had good results with the Toradol medication and no red blood cells in his urine although he did have crystals in the urine which is suspicious.   he will strain his urine over the next couple of days.  Commended that he follow-up with his primary care physician. 4. F/u prn if symptoms worsen or don't improve  Final Clinical Impressions(s) / UC Diagnoses  Final diagnoses:  Rib pain on right side     Discharge Instructions     Apply ice 20 minutes out of every 2 hours 4-5 times daily for comfort. Use  Caution while taking muscle relaxers.  Do not perform activities requiring concentration or judgment and do not drive.     ED Prescriptions    Medication Sig Dispense Auth. Provider   meloxicam  (MOBIC) 7.5 MG tablet Take 1 tablet (7.5 mg total) by mouth daily. 30 tablet Crecencio Mc P, PA-C   tiZANidine (ZANAFLEX) 4 MG capsule Take 1 capsule (4 mg total) by mouth 3 (three) times daily. 21 capsule Lorin Picket, PA-C     Controlled Substance Prescriptions [place] Controlled Substance Registry consulted? Not Applicable   Lorin Picket, PA-C 11/23/17 1628

## 2017-11-23 NOTE — ED Triage Notes (Signed)
Patient c/o right lower back pain that started yesterday morning. Denies urinary symptoms. Denies any injury to the area.

## 2017-11-23 NOTE — Discharge Instructions (Signed)
Apply ice 20 minutes out of every 2 hours 4-5 times daily for comfort. Use  Caution while taking muscle relaxers.  Do not perform activities requiring concentration or judgment and do not drive.  °

## 2017-11-26 ENCOUNTER — Other Ambulatory Visit: Payer: Self-pay

## 2017-11-26 DIAGNOSIS — I10 Essential (primary) hypertension: Secondary | ICD-10-CM

## 2017-11-26 MED ORDER — LOSARTAN POTASSIUM-HCTZ 100-25 MG PO TABS: 1.0000 | ORAL_TABLET | Freq: Every day | ORAL | 1 refills | Status: DC

## 2017-12-14 DIAGNOSIS — E1142 Type 2 diabetes mellitus with diabetic polyneuropathy: Secondary | ICD-10-CM | POA: Diagnosis not present

## 2017-12-14 DIAGNOSIS — I10 Essential (primary) hypertension: Secondary | ICD-10-CM | POA: Diagnosis not present

## 2017-12-14 DIAGNOSIS — E1169 Type 2 diabetes mellitus with other specified complication: Secondary | ICD-10-CM | POA: Diagnosis not present

## 2017-12-14 DIAGNOSIS — E785 Hyperlipidemia, unspecified: Secondary | ICD-10-CM | POA: Diagnosis not present

## 2017-12-14 DIAGNOSIS — E1159 Type 2 diabetes mellitus with other circulatory complications: Secondary | ICD-10-CM | POA: Diagnosis not present

## 2017-12-14 DIAGNOSIS — Z794 Long term (current) use of insulin: Secondary | ICD-10-CM | POA: Diagnosis not present

## 2017-12-14 LAB — HEMOGLOBIN A1C: Hgb A1c MFr Bld: 9 — AB (ref 4.0–6.0)

## 2018-01-09 ENCOUNTER — Inpatient Hospital Stay: Payer: Medicare Other | Attending: Oncology

## 2018-01-09 DIAGNOSIS — I129 Hypertensive chronic kidney disease with stage 1 through stage 4 chronic kidney disease, or unspecified chronic kidney disease: Secondary | ICD-10-CM | POA: Diagnosis not present

## 2018-01-09 DIAGNOSIS — E1122 Type 2 diabetes mellitus with diabetic chronic kidney disease: Secondary | ICD-10-CM | POA: Diagnosis not present

## 2018-01-09 DIAGNOSIS — E876 Hypokalemia: Secondary | ICD-10-CM | POA: Diagnosis not present

## 2018-01-09 DIAGNOSIS — G6181 Chronic inflammatory demyelinating polyneuritis: Secondary | ICD-10-CM | POA: Insufficient documentation

## 2018-01-09 DIAGNOSIS — Z95828 Presence of other vascular implants and grafts: Secondary | ICD-10-CM

## 2018-01-09 DIAGNOSIS — N189 Chronic kidney disease, unspecified: Secondary | ICD-10-CM | POA: Diagnosis not present

## 2018-01-09 DIAGNOSIS — Z79899 Other long term (current) drug therapy: Secondary | ICD-10-CM | POA: Diagnosis not present

## 2018-01-09 DIAGNOSIS — Z87891 Personal history of nicotine dependence: Secondary | ICD-10-CM | POA: Insufficient documentation

## 2018-01-09 MED ORDER — SODIUM CHLORIDE 0.9% FLUSH
10.0000 mL | INTRAVENOUS | Status: DC | PRN
  Administered 2018-01-09: 10 mL via INTRAVENOUS
  Filled 2018-01-09: qty 10

## 2018-01-09 MED ORDER — HEPARIN SOD (PORK) LOCK FLUSH 100 UNIT/ML IV SOLN
500.0000 [IU] | Freq: Once | INTRAVENOUS | Status: AC
  Administered 2018-01-09: 500 [IU] via INTRAVENOUS

## 2018-01-28 ENCOUNTER — Inpatient Hospital Stay (HOSPITAL_BASED_OUTPATIENT_CLINIC_OR_DEPARTMENT_OTHER): Payer: Medicare Other | Admitting: Oncology

## 2018-01-28 ENCOUNTER — Encounter: Payer: Self-pay | Admitting: Oncology

## 2018-01-28 ENCOUNTER — Inpatient Hospital Stay: Payer: Medicare Other

## 2018-01-28 VITALS — BP 180/84 | HR 106 | Temp 96.9°F | Resp 18 | Ht 74.0 in | Wt 208.3 lb

## 2018-01-28 VITALS — BP 158/75 | HR 85 | Temp 96.8°F | Resp 18

## 2018-01-28 DIAGNOSIS — E876 Hypokalemia: Secondary | ICD-10-CM | POA: Diagnosis not present

## 2018-01-28 DIAGNOSIS — Z87891 Personal history of nicotine dependence: Secondary | ICD-10-CM

## 2018-01-28 DIAGNOSIS — E119 Type 2 diabetes mellitus without complications: Secondary | ICD-10-CM | POA: Diagnosis not present

## 2018-01-28 DIAGNOSIS — Z79899 Other long term (current) drug therapy: Secondary | ICD-10-CM

## 2018-01-28 DIAGNOSIS — N189 Chronic kidney disease, unspecified: Secondary | ICD-10-CM | POA: Diagnosis not present

## 2018-01-28 DIAGNOSIS — E1122 Type 2 diabetes mellitus with diabetic chronic kidney disease: Secondary | ICD-10-CM | POA: Diagnosis not present

## 2018-01-28 DIAGNOSIS — I10 Essential (primary) hypertension: Secondary | ICD-10-CM | POA: Diagnosis not present

## 2018-01-28 DIAGNOSIS — G6181 Chronic inflammatory demyelinating polyneuritis: Secondary | ICD-10-CM

## 2018-01-28 DIAGNOSIS — I129 Hypertensive chronic kidney disease with stage 1 through stage 4 chronic kidney disease, or unspecified chronic kidney disease: Secondary | ICD-10-CM | POA: Diagnosis not present

## 2018-01-28 LAB — CBC WITH DIFFERENTIAL/PLATELET
BASOS PCT: 1 %
Basophils Absolute: 0.1 10*3/uL (ref 0–0.1)
EOS ABS: 0.3 10*3/uL (ref 0–0.7)
Eosinophils Relative: 3 %
HCT: 41 % (ref 40.0–52.0)
Hemoglobin: 14 g/dL (ref 13.0–18.0)
Lymphocytes Relative: 27 %
Lymphs Abs: 2.1 10*3/uL (ref 1.0–3.6)
MCH: 33.6 pg (ref 26.0–34.0)
MCHC: 34.1 g/dL (ref 32.0–36.0)
MCV: 98.6 fL (ref 80.0–100.0)
MONOS PCT: 9 %
Monocytes Absolute: 0.7 10*3/uL (ref 0.2–1.0)
Neutro Abs: 4.7 10*3/uL (ref 1.4–6.5)
Neutrophils Relative %: 60 %
Platelets: 151 10*3/uL (ref 150–440)
RBC: 4.16 MIL/uL — ABNORMAL LOW (ref 4.40–5.90)
RDW: 12.4 % (ref 11.5–14.5)
WBC: 7.9 10*3/uL (ref 3.8–10.6)

## 2018-01-28 LAB — COMPREHENSIVE METABOLIC PANEL
ALBUMIN: 4.3 g/dL (ref 3.5–5.0)
ALK PHOS: 61 U/L (ref 38–126)
ALT: 26 U/L (ref 0–44)
AST: 38 U/L (ref 15–41)
Anion gap: 17 — ABNORMAL HIGH (ref 5–15)
BUN: 22 mg/dL (ref 8–23)
CALCIUM: 9 mg/dL (ref 8.9–10.3)
CO2: 26 mmol/L (ref 22–32)
CREATININE: 1.81 mg/dL — AB (ref 0.61–1.24)
Chloride: 92 mmol/L — ABNORMAL LOW (ref 98–111)
GFR calc non Af Amer: 37 mL/min — ABNORMAL LOW (ref >60)
GFR, EST AFRICAN AMERICAN: 43 mL/min — AB (ref >60)
GLUCOSE: 310 mg/dL — AB (ref 70–99)
Potassium: 3.2 mmol/L — ABNORMAL LOW (ref 3.5–5.1)
SODIUM: 135 mmol/L (ref 135–145)
Total Bilirubin: 0.4 mg/dL (ref 0.3–1.2)
Total Protein: 8.3 g/dL — ABNORMAL HIGH (ref 6.5–8.1)

## 2018-01-28 MED ORDER — HEPARIN SOD (PORK) LOCK FLUSH 100 UNIT/ML IV SOLN
500.0000 [IU] | Freq: Once | INTRAVENOUS | Status: AC
  Administered 2018-01-28: 500 [IU] via INTRAVENOUS
  Filled 2018-01-28: qty 5

## 2018-01-28 MED ORDER — IMMUNE GLOBULIN (HUMAN) 20 GM/200ML IV SOLN
50.0000 g | INTRAVENOUS | Status: DC
  Administered 2018-01-28: 50 g via INTRAVENOUS
  Filled 2018-01-28: qty 100

## 2018-01-28 MED ORDER — DEXTROSE 5 % IV SOLN
Freq: Once | INTRAVENOUS | Status: AC
  Administered 2018-01-28: 10:00:00 via INTRAVENOUS
  Filled 2018-01-28: qty 250

## 2018-01-28 MED ORDER — DIPHENHYDRAMINE HCL 25 MG PO CAPS
25.0000 mg | ORAL_CAPSULE | Freq: Once | ORAL | Status: AC
  Administered 2018-01-28: 25 mg via ORAL
  Filled 2018-01-28 (×2): qty 1

## 2018-01-28 MED ORDER — ACETAMINOPHEN 325 MG PO TABS
650.0000 mg | ORAL_TABLET | Freq: Every day | ORAL | Status: DC
  Administered 2018-01-28: 650 mg via ORAL
  Filled 2018-01-28: qty 2

## 2018-01-28 MED ORDER — SODIUM CHLORIDE 0.9% FLUSH
10.0000 mL | INTRAVENOUS | Status: DC | PRN
  Administered 2018-01-28: 10 mL via INTRAVENOUS
  Filled 2018-01-28: qty 10

## 2018-01-28 NOTE — Progress Notes (Signed)
Hematology/Oncology Consult note [facility]  Telephone:([phone] [phone]  Patient Care Team: Juline Patch, MD as PCP - General (Family Medicine) Vladimir Crofts, MD as Consulting Physician (Neurology)   Name of the patient: Phillip Fitzgerald  [id number]  03/27/1951   Date of visit: 01/28/18  Diagnosis- IVIG for CIDP  Chief complaint/ Reason for visit- on treatment assessment for IVIG for CIDP  Heme/Onc history: patient is a 67 year old male who was diagnosed with chronic inflammation in demyelinating polyneuropathy (CIDP) in 2012. Dr. Jennings Books is his outpatient neurologist and he has been getting IVIG infusion at home 2 mg/kg over 4 days every 3 months maintenance regimen. His last dose was in October 2017 and he was due for a repeat dose in January 2018. However insurance denied home infusions and he is required to receive infusions at the infusion center and hence has been referred to [place] for the same. Patient's last visit with Dr. Manuella Ghazi was inJuly 2017. Patient states that he has been tolerating his IVIG well without any significant side effects. At one point in the past he did have a rash in his palms as well as developed a headache when it was given too fast. He has not had any side effects recently. He has responded well to IVIG and is now able to walk with a cane. When he was initially diagnosed in 2012 he was unable to lift his whole leg up and also had difficulty with his hands.[facility] Kitchen EMG in April 2012 showed abnormal electrodiagnostic study consistent with generalized severe acquired and sensory motor polyneuropathy with neuropathic changes on the legs and all limbs. Lumbar puncture showed mildly basic protein was 0.6 normal IgG synthesis rate was -8.8 normal he has 0 on oligoclonal bands in his CSF. He was also seen at [facility] for second opinion and diagnosis was confirmed. He does have a port in place to receive IVIG. He gets maintenance IVIG every 4  months  Interval history- he is doing well. He does have some lower extremity weakness but overall stable with maintenance doses of IVIG  ECOG PS- 1 Pain scale- 0 Opioid associated constipation- no  Review of systems- Review of Systems  Constitutional: Positive for malaise/fatigue. Negative for chills, fever and weight loss.  HENT: Negative for congestion, ear discharge and nosebleeds.   Eyes: Negative for blurred vision.  Respiratory: Negative for cough, hemoptysis, sputum production, shortness of breath and wheezing.   Cardiovascular: Negative for chest pain, palpitations, orthopnea and claudication.  Gastrointestinal: Negative for abdominal pain, blood in stool, constipation, diarrhea, heartburn, melena, nausea and vomiting.  Genitourinary: Negative for dysuria, flank pain, frequency, hematuria and urgency.  Musculoskeletal: Negative for back pain, joint pain and myalgias.  Skin: Negative for rash.  Neurological: Negative for dizziness, tingling, focal weakness, seizures, weakness and headaches.  Endo/Heme/Allergies: Does not bruise/bleed easily.  Psychiatric/Behavioral: Negative for depression and suicidal ideas. The patient does not have insomnia.        Allergies  Allergen Reactions  . Penicillins Other (See Comments)    Unknown reaction from chilhood     Past Medical History:  Diagnosis Date  . CIDP (chronic inflammatory demyelinating polyneuropathy) (HCC)    left leg weakness  . CIDP (chronic inflammatory demyelinating polyneuropathy) ([place])   . Diabetes mellitus without complication ([place])   . Elevated lipids   . Hypertension   . Sleep apnea    CPAP     Past Surgical History:  Procedure Laterality Date  .  APPENDECTOMY    . COLONOSCOPY  2011   cleared for 5 yrs- Dr Allen Norris  . COLONOSCOPY WITH PROPOFOL N/A 10/15/2017   Procedure: COLONOSCOPY WITH PROPOFOL;  Surgeon: [name], MD;  Location: [place];  Service: Endoscopy;  Laterality: N/A;  Diabetic  and sleep apnea  . KNEE SURGERY Right    scope  . POLYPECTOMY  10/15/2017   Procedure: POLYPECTOMY;  Surgeon: [name], MD;  Location: [place];  Service: Endoscopy;;  . PORTACATH PLACEMENT Right 09/16/2015   Procedure: INSERTION PORT-A-CATH;  Surgeon: Leonie Green, MD;  Location: [facility];  Service: General;  Laterality: Right;  . TONSILLECTOMY      Social History   Socioeconomic History  . Marital status: Married    Spouse name: Not on file  . Number of children: Not on file  . Years of education: Not on file  . Highest education level: Not on file  Occupational History  . Not on file  Social Needs  . Financial resource strain: Not on file  . Food insecurity:    Worry: Not on file    Inability: Not on file  . Transportation needs:    Medical: Not on file    Non-medical: Not on file  Tobacco Use  . Smoking status: Former Smoker    Last attempt to quit: 05/08/1978    Years since quitting: 39.7  . Smokeless tobacco: Never Used  Substance and Sexual Activity  . Alcohol use: Yes    Alcohol/week: 3.0 standard drinks    Types: 3 Cans of beer per week    Comment: none since tuesday 5/9, usually a glass of wine or 2 at dinner daily  . Drug use: No  . Sexual activity: Yes  Lifestyle  . Physical activity:    Days per week: Not on file    Minutes per session: Not on file  . Stress: Not on file  Relationships  . Social connections:    Talks on phone: Not on file    Gets together: Not on file    Attends religious service: Not on file    Active member of club or organization: Not on file    Attends meetings of clubs or organizations: Not on file    Relationship status: Not on file  . Intimate partner violence:    Fear of current or ex partner: Not on file    Emotionally abused: Not on file    Physically abused: Not on file    Forced sexual activity: Not on file  Other Topics Concern  . Not on file  Social History Narrative  . Not on file    Family  History  Problem Relation Age of Onset  . Diabetes Father   . Heart disease Father   . Angina Mother   . Macular degeneration Mother   . Lung cancer Sister      Current Outpatient Medications:  .  amLODipine (NORVASC) 10 MG tablet, TAKE 1 TABLET BY MOUTH ONCE DAILY. NEED APPOINTMENT FOR FURTHER REFILLS, Disp: 90 tablet, Rfl: 1 .  carvedilol (COREG) 12.5 MG tablet, Take 2 tablets (25 mg total) by mouth 2 (two) times daily with a meal. TAKE 1 TABLET (12.5 MG TOTAL) BY MOUTH 2 (TWO) TIMES DAILY WITH A MEAL., Disp: 180 tablet, Rfl: 1 .  Dulaglutide (TRULICITY) 9.50 DT/2.6ZT SOPN, Inject into the skin once a week. Dr Honor Junes, Disp: , Rfl:  .  fluticasone (FLONASE) 50 MCG/ACT nasal spray, Place 2 sprays into  both nostrils daily., Disp: 16 g, Rfl: 6 .  Immune Globulin 10% (GAMUNEX-C) 10 GM/100ML SOLN, Inject into the vein every 4 (four) months. 50 grams- Dr Manuella Ghazi, Disp: , Rfl:  .  Insulin Glargine (LANTUS SOLOSTAR) 100 UNIT/ML Solostar Pen, INJECT 52 UNITS UNDER THE SKIN AT BEDTIME- Dr Maretta Bees, Disp: 15 mL, Rfl: 6 .  Insulin Pen Needle (FIFTY50 PEN NEEDLES) 31G X 8 MM MISC, Use 4 (four) times daily. DX: E11.8, Disp: 100 each, Rfl: 6 .  loratadine (CLARITIN) 10 MG tablet, Take 1 tablet (10 mg total) by mouth daily., Disp: 30 tablet, Rfl: 11 .  losartan-hydrochlorothiazide (HYZAAR) 100-25 MG tablet, Take 1 tablet by mouth daily., Disp: 90 tablet, Rfl: 1 .  meloxicam (MOBIC) 7.5 MG tablet, Take 1 tablet (7.5 mg total) by mouth daily., Disp: 30 tablet, Rfl: 0 .  metFORMIN (GLUCOPHAGE) 500 MG tablet, Take 1 tablet (500 mg total) by mouth 2 (two) times daily. (Patient taking differently: Take 500 mg by mouth 2 (two) times daily. Dr Honor Junes), Disp: 60 tablet, Rfl: 6 .  NOVOLOG FLEXPEN 100 UNIT/ML FlexPen, Dr Honor Junes, Disp: , Rfl:  .  rosuvastatin (CRESTOR) 20 MG tablet, Take 1 tablet (20 mg total) by mouth daily., Disp: 90 tablet, Rfl: 1 .  venlafaxine (EFFEXOR) 100 MG tablet, Take 1 tablet (100 mg  total) by mouth 2 (two) times daily., Disp: 180 tablet, Rfl: 1 No current facility-administered medications for this visit.   Facility-Administered Medications Ordered in Other Visits:  .  acetaminophen (TYLENOL) tablet 650 mg, 650 mg, Oral, Daily, Sindy Guadeloupe, MD, 650 mg at 01/28/18 0939 .  heparin lock flush 100 unit/mL, 500 Units, Intravenous, Once, Sindy Guadeloupe, MD .  Immune Globulin 10% (PRIVIGEN) IV infusion 50 g, 50 g, Intravenous, Q24H, Sindy Guadeloupe, MD, Last Rate: 30 mL/hr at 01/28/18 1006, 50 g at 01/28/18 1006 .  sodium chloride flush (NS) 0.9 % injection 10 mL, 10 mL, Intravenous, PRN, Sindy Guadeloupe, MD, 10 mL at 01/28/18 0850  Physical exam:  Vitals:   01/28/18 0911  BP: (!) 180/84  Pulse: (!) 106  Resp: 18  Temp: (!) 96.9 F (36.1 C)  TempSrc: Tympanic  Weight: 208 lb 5.4 oz (94.5 kg)  Height: 6\' 2"  (1.88 m)   Physical Exam  Constitutional: He is oriented to person, place, and time. He appears well-developed and well-nourished.  HENT:  Head: Normocephalic and atraumatic.  Eyes: Pupils are equal, round, and reactive to light. EOM are normal.  Neck: Normal range of motion.  Cardiovascular: Normal rate, regular rhythm and normal heart sounds.  Pulmonary/Chest: Effort normal and breath sounds normal.  Abdominal: Soft. Bowel sounds are normal.  Neurological: He is alert and oriented to person, place, and time.  Skin: Skin is warm and dry.     CMP Latest Ref Rng & Units 01/28/2018  Glucose 70 - 99 mg/dL 310(H)  BUN 8 - 23 mg/dL 22  Creatinine 0.61 - 1.24 mg/dL 1.81(H)  Sodium 135 - 145 mmol/L 135  Potassium 3.5 - 5.1 mmol/L 3.2(L)  Chloride 98 - 111 mmol/L 92(L)  CO2 22 - 32 mmol/L 26  Calcium 8.9 - 10.3 mg/dL 9.0  Total Protein 6.5 - 8.1 g/dL 8.3(H)  Total Bilirubin 0.3 - 1.2 mg/dL 0.4  Alkaline Phos 38 - 126 U/L 61  AST 15 - 41 U/L 38  ALT 0 - 44 U/L 26   CBC Latest Ref Rng & Units 01/28/2018  WBC 3.8 - 10.6 K/uL 7.9  Hemoglobin 13.0 - 18.0 g/dL 14.0   Hematocrit 40.0 - 52.0 % 41.0  Platelets 150 - 440 K/uL 151     Assessment and plan- Patient is a 67 y.o. male with CIDP followed by neurology. He is here to receive maintenance IVIG  Patient has been getting maintenance IVIG Q4 months and due for it today. He gets 50 gm daily X4 days with pre meds. He will be seeing neurology in 2 weeks  I would like patient to be seen by neurology prior to each IVIG and get his labs checked. Especially given that his kidney functions are waxing and waning they need to be reviewed by neurology if they would deem it appropriate for him to receive IVIG with ongoing CKD.   I will refer him to nephrology for management of his CKD.   I will see him back in 4-5 months with cbc/cmp but he needs to be seen by neurology a week or 2 prior with labs regarding decision to continue IVIG  He has mild hypokalemia. Repeat bmp in 4 days. This needs to be monitored by Dr . Ronnald Ramp   Visit Diagnosis 1. Chronic inflammatory demyelinating polyneuropathy (HCC)   2. Long-term current use of intravenous immunoglobulin (IVIG)   3. Chronic kidney disease, unspecified CKD stage      Dr. Randa Evens, MD, MPH [facility] at [facility] [phone] 01/28/2018 11:14 AM

## 2018-01-28 NOTE — Progress Notes (Signed)
No conerns today, wants his next treatment in 5 months

## 2018-01-29 ENCOUNTER — Inpatient Hospital Stay: Payer: Medicare Other

## 2018-01-29 VITALS — BP 166/78 | HR 84 | Temp 98.0°F | Resp 18 | Wt 208.0 lb

## 2018-01-29 DIAGNOSIS — E1122 Type 2 diabetes mellitus with diabetic chronic kidney disease: Secondary | ICD-10-CM | POA: Diagnosis not present

## 2018-01-29 DIAGNOSIS — G6181 Chronic inflammatory demyelinating polyneuritis: Secondary | ICD-10-CM | POA: Diagnosis not present

## 2018-01-29 DIAGNOSIS — I129 Hypertensive chronic kidney disease with stage 1 through stage 4 chronic kidney disease, or unspecified chronic kidney disease: Secondary | ICD-10-CM | POA: Diagnosis not present

## 2018-01-29 DIAGNOSIS — N189 Chronic kidney disease, unspecified: Secondary | ICD-10-CM | POA: Diagnosis not present

## 2018-01-29 DIAGNOSIS — Z87891 Personal history of nicotine dependence: Secondary | ICD-10-CM | POA: Diagnosis not present

## 2018-01-29 DIAGNOSIS — E876 Hypokalemia: Secondary | ICD-10-CM | POA: Diagnosis not present

## 2018-01-29 MED ORDER — DEXTROSE 5 % IV SOLN
INTRAVENOUS | Status: DC
  Administered 2018-01-29: 09:00:00 via INTRAVENOUS
  Filled 2018-01-29: qty 250

## 2018-01-29 MED ORDER — IMMUNE GLOBULIN (HUMAN) 20 GM/200ML IV SOLN
50.0000 g | INTRAVENOUS | Status: DC
  Administered 2018-01-29: 50 g via INTRAVENOUS
  Filled 2018-01-29: qty 100

## 2018-01-29 MED ORDER — ACETAMINOPHEN 325 MG PO TABS
650.0000 mg | ORAL_TABLET | Freq: Every day | ORAL | Status: DC
  Administered 2018-01-29: 650 mg via ORAL
  Filled 2018-01-29: qty 2

## 2018-01-29 MED ORDER — DIPHENHYDRAMINE HCL 25 MG PO CAPS
25.0000 mg | ORAL_CAPSULE | Freq: Once | ORAL | Status: AC
  Administered 2018-01-29: 25 mg via ORAL
  Filled 2018-01-29: qty 1

## 2018-01-29 MED ORDER — HEPARIN SOD (PORK) LOCK FLUSH 100 UNIT/ML IV SOLN
500.0000 [IU] | Freq: Once | INTRAVENOUS | Status: AC | PRN
  Administered 2018-01-29: 500 [IU]
  Filled 2018-01-29: qty 5

## 2018-01-29 MED ORDER — SODIUM CHLORIDE 0.9% FLUSH
10.0000 mL | Freq: Once | INTRAVENOUS | Status: AC | PRN
  Administered 2018-01-29: 10 mL
  Filled 2018-01-29: qty 10

## 2018-01-30 ENCOUNTER — Inpatient Hospital Stay: Payer: Medicare Other

## 2018-01-30 VITALS — BP 156/72 | HR 92 | Temp 97.5°F | Resp 18

## 2018-01-30 DIAGNOSIS — G6181 Chronic inflammatory demyelinating polyneuritis: Secondary | ICD-10-CM | POA: Diagnosis not present

## 2018-01-30 DIAGNOSIS — Z87891 Personal history of nicotine dependence: Secondary | ICD-10-CM | POA: Diagnosis not present

## 2018-01-30 DIAGNOSIS — N189 Chronic kidney disease, unspecified: Secondary | ICD-10-CM | POA: Diagnosis not present

## 2018-01-30 DIAGNOSIS — E1122 Type 2 diabetes mellitus with diabetic chronic kidney disease: Secondary | ICD-10-CM | POA: Diagnosis not present

## 2018-01-30 DIAGNOSIS — I129 Hypertensive chronic kidney disease with stage 1 through stage 4 chronic kidney disease, or unspecified chronic kidney disease: Secondary | ICD-10-CM | POA: Diagnosis not present

## 2018-01-30 DIAGNOSIS — E876 Hypokalemia: Secondary | ICD-10-CM | POA: Diagnosis not present

## 2018-01-30 MED ORDER — IMMUNE GLOBULIN (HUMAN) 20 GM/200ML IV SOLN
50.0000 g | INTRAVENOUS | Status: DC
  Administered 2018-01-30: 50 g via INTRAVENOUS
  Filled 2018-01-30: qty 100

## 2018-01-30 MED ORDER — ACETAMINOPHEN 325 MG PO TABS
650.0000 mg | ORAL_TABLET | Freq: Every day | ORAL | Status: DC
  Administered 2018-01-30: 650 mg via ORAL

## 2018-01-30 MED ORDER — DIPHENHYDRAMINE HCL 25 MG PO CAPS
25.0000 mg | ORAL_CAPSULE | Freq: Once | ORAL | Status: AC
  Administered 2018-01-30: 25 mg via ORAL
  Filled 2018-01-30: qty 1

## 2018-01-30 MED ORDER — DEXTROSE 5 % IV SOLN
INTRAVENOUS | Status: DC
  Administered 2018-01-30: 09:00:00 via INTRAVENOUS
  Filled 2018-01-30: qty 250

## 2018-01-30 MED ORDER — SODIUM CHLORIDE 0.9% FLUSH
10.0000 mL | INTRAVENOUS | Status: DC | PRN
  Administered 2018-01-30: 10 mL via INTRAVENOUS
  Filled 2018-01-30: qty 10

## 2018-01-30 MED ORDER — ALTEPLASE 2 MG IJ SOLR: 2.0000 mg | Freq: Once | INTRAMUSCULAR | Status: DC | PRN

## 2018-01-30 MED ORDER — HEPARIN SOD (PORK) LOCK FLUSH 100 UNIT/ML IV SOLN
500.0000 [IU] | Freq: Once | INTRAVENOUS | Status: AC
  Administered 2018-01-30: 500 [IU] via INTRAVENOUS

## 2018-01-31 ENCOUNTER — Inpatient Hospital Stay: Payer: Medicare Other

## 2018-01-31 ENCOUNTER — Telehealth: Payer: Self-pay

## 2018-01-31 VITALS — BP 151/77 | HR 88 | Temp 96.7°F | Resp 18

## 2018-01-31 DIAGNOSIS — Z87891 Personal history of nicotine dependence: Secondary | ICD-10-CM | POA: Diagnosis not present

## 2018-01-31 DIAGNOSIS — N189 Chronic kidney disease, unspecified: Secondary | ICD-10-CM | POA: Diagnosis not present

## 2018-01-31 DIAGNOSIS — G6181 Chronic inflammatory demyelinating polyneuritis: Secondary | ICD-10-CM | POA: Diagnosis not present

## 2018-01-31 DIAGNOSIS — Z79899 Other long term (current) drug therapy: Secondary | ICD-10-CM

## 2018-01-31 DIAGNOSIS — E876 Hypokalemia: Secondary | ICD-10-CM | POA: Diagnosis not present

## 2018-01-31 DIAGNOSIS — E1122 Type 2 diabetes mellitus with diabetic chronic kidney disease: Secondary | ICD-10-CM | POA: Diagnosis not present

## 2018-01-31 DIAGNOSIS — I129 Hypertensive chronic kidney disease with stage 1 through stage 4 chronic kidney disease, or unspecified chronic kidney disease: Secondary | ICD-10-CM | POA: Diagnosis not present

## 2018-01-31 LAB — BASIC METABOLIC PANEL
ANION GAP: 13 (ref 5–15)
BUN: 25 mg/dL — ABNORMAL HIGH (ref 8–23)
CALCIUM: 8.9 mg/dL (ref 8.9–10.3)
CO2: 28 mmol/L (ref 22–32)
Chloride: 93 mmol/L — ABNORMAL LOW (ref 98–111)
Creatinine, Ser: 1.61 mg/dL — ABNORMAL HIGH (ref 0.61–1.24)
GFR calc Af Amer: 49 mL/min — ABNORMAL LOW (ref >60)
GFR, EST NON AFRICAN AMERICAN: 43 mL/min — AB (ref >60)
GLUCOSE: 224 mg/dL — AB (ref 70–99)
Potassium: 3.5 mmol/L (ref 3.5–5.1)
Sodium: 134 mmol/L — ABNORMAL LOW (ref 135–145)

## 2018-01-31 MED ORDER — DEXTROSE 5 % IV SOLN
Freq: Once | INTRAVENOUS | Status: AC
  Administered 2018-01-31: 09:00:00 via INTRAVENOUS
  Filled 2018-01-31: qty 250

## 2018-01-31 MED ORDER — DIPHENHYDRAMINE HCL 25 MG PO TABS
25.0000 mg | ORAL_TABLET | Freq: Once | ORAL | Status: AC
  Administered 2018-01-31: 25 mg via ORAL
  Filled 2018-01-31: qty 1

## 2018-01-31 MED ORDER — SODIUM CHLORIDE 0.9% FLUSH
10.0000 mL | INTRAVENOUS | Status: DC | PRN
  Administered 2018-01-31: 10 mL via INTRAVENOUS
  Filled 2018-01-31: qty 10

## 2018-01-31 MED ORDER — HEPARIN SOD (PORK) LOCK FLUSH 100 UNIT/ML IV SOLN
500.0000 [IU] | Freq: Once | INTRAVENOUS | Status: AC
  Administered 2018-01-31: 500 [IU] via INTRAVENOUS

## 2018-01-31 MED ORDER — ACETAMINOPHEN 325 MG PO TABS
650.0000 mg | ORAL_TABLET | Freq: Every day | ORAL | Status: DC
  Administered 2018-01-31: 650 mg via ORAL

## 2018-01-31 MED ORDER — IMMUNE GLOBULIN (HUMAN) 20 GM/200ML IV SOLN
50.0000 g | INTRAVENOUS | Status: AC
  Administered 2018-01-31: 50 g via INTRAVENOUS
  Filled 2018-01-31: qty 100

## 2018-01-31 NOTE — Telephone Encounter (Signed)
Left message on the patient voice mail to inform the patient that his potassium was normal and kidney has improved, per dr Janese Banks the patient need to see his Nephrology. I have faxed over the last 2 set of labs to his nephrology office (Shah,Hemand, MD) and called to confirm they have received the labs that has been faxed on 01/31/18 at 11:40 AM.

## 2018-01-31 NOTE — Patient Instructions (Signed)

## 2018-02-12 ENCOUNTER — Telehealth: Payer: Self-pay | Admitting: *Deleted

## 2018-02-12 NOTE — Telephone Encounter (Signed)
Received a fax today from [facility].  Patient has an appointment to see Dr. Zollie Scale on October 31 at 120.  According to fax sheet they have called the patient twice on 9/24 and 10/ 4 about the appointment date and time and  where to come to the appt.

## 2018-02-14 DIAGNOSIS — G4733 Obstructive sleep apnea (adult) (pediatric): Secondary | ICD-10-CM | POA: Diagnosis not present

## 2018-02-14 DIAGNOSIS — Z9989 Dependence on other enabling machines and devices: Secondary | ICD-10-CM | POA: Diagnosis not present

## 2018-02-14 DIAGNOSIS — G6181 Chronic inflammatory demyelinating polyneuritis: Secondary | ICD-10-CM | POA: Diagnosis not present

## 2018-02-14 DIAGNOSIS — M79642 Pain in left hand: Secondary | ICD-10-CM | POA: Diagnosis not present

## 2018-02-14 DIAGNOSIS — M79641 Pain in right hand: Secondary | ICD-10-CM | POA: Diagnosis not present

## 2018-03-07 DIAGNOSIS — I10 Essential (primary) hypertension: Secondary | ICD-10-CM | POA: Diagnosis not present

## 2018-03-07 DIAGNOSIS — E1122 Type 2 diabetes mellitus with diabetic chronic kidney disease: Secondary | ICD-10-CM | POA: Diagnosis not present

## 2018-03-07 DIAGNOSIS — R809 Proteinuria, unspecified: Secondary | ICD-10-CM | POA: Diagnosis not present

## 2018-03-07 DIAGNOSIS — N183 Chronic kidney disease, stage 3 (moderate): Secondary | ICD-10-CM | POA: Diagnosis not present

## 2018-03-11 ENCOUNTER — Other Ambulatory Visit: Payer: Self-pay | Admitting: Nephrology

## 2018-03-11 DIAGNOSIS — N183 Chronic kidney disease, stage 3 unspecified: Secondary | ICD-10-CM

## 2018-03-12 ENCOUNTER — Telehealth: Payer: Self-pay | Admitting: Family Medicine

## 2018-03-12 NOTE — Telephone Encounter (Signed)
Called to schedule Medicare Annual Wellness Visit with the Nurse Health Advisor.  ° °If patient returns call, please schedule AWV with [facility] any date  ° °Thank you! °For any questions please contact: Kathryn Brown [phone] or Skype at: [email or web address]  ° ° °

## 2018-03-18 ENCOUNTER — Ambulatory Visit
Admission: RE | Admit: 2018-03-18 | Discharge: 2018-03-18 | Disposition: A | Discharge status: Home / Self Care | Payer: Medicare Other | Source: Ambulatory Visit | Attending: Nephrology | Admitting: Nephrology

## 2018-03-18 DIAGNOSIS — N183 Chronic kidney disease, stage 3 unspecified: Secondary | ICD-10-CM

## 2018-03-21 NOTE — Telephone Encounter (Signed)
Pt stated on vm that he will be traveling in from two hours away in [place] and would prefer to do both visits on one day. I left message to c/b knb

## 2018-03-28 ENCOUNTER — Inpatient Hospital Stay: Payer: Medicare Other

## 2018-03-29 ENCOUNTER — Ambulatory Visit: Payer: Medicare Other | Admitting: Family Medicine

## 2018-03-29 ENCOUNTER — Inpatient Hospital Stay: Payer: Medicare Other

## 2018-04-01 ENCOUNTER — Inpatient Hospital Stay: Payer: Medicare Other | Attending: Oncology

## 2018-04-01 ENCOUNTER — Other Ambulatory Visit: Payer: Self-pay

## 2018-04-01 ENCOUNTER — Ambulatory Visit (INDEPENDENT_AMBULATORY_CARE_PROVIDER_SITE_OTHER): Payer: Medicare Other

## 2018-04-01 ENCOUNTER — Ambulatory Visit (INDEPENDENT_AMBULATORY_CARE_PROVIDER_SITE_OTHER): Payer: Medicare Other | Admitting: Family Medicine

## 2018-04-01 ENCOUNTER — Encounter: Payer: Self-pay | Admitting: Family Medicine

## 2018-04-01 VITALS — BP 190/80 | HR 75 | Temp 97.0°F | Resp 18

## 2018-04-01 VITALS — BP 138/80 | HR 80 | Ht 74.0 in | Wt 215.0 lb

## 2018-04-01 VITALS — BP 138/80 | HR 80 | Resp 16 | Ht 74.0 in | Wt 215.0 lb

## 2018-04-01 DIAGNOSIS — E782 Mixed hyperlipidemia: Secondary | ICD-10-CM

## 2018-04-01 DIAGNOSIS — J301 Allergic rhinitis due to pollen: Secondary | ICD-10-CM

## 2018-04-01 DIAGNOSIS — Z95828 Presence of other vascular implants and grafts: Secondary | ICD-10-CM

## 2018-04-01 DIAGNOSIS — Z452 Encounter for adjustment and management of vascular access device: Secondary | ICD-10-CM | POA: Insufficient documentation

## 2018-04-01 DIAGNOSIS — E1169 Type 2 diabetes mellitus with other specified complication: Secondary | ICD-10-CM | POA: Diagnosis not present

## 2018-04-01 DIAGNOSIS — E785 Hyperlipidemia, unspecified: Secondary | ICD-10-CM | POA: Diagnosis not present

## 2018-04-01 DIAGNOSIS — R809 Proteinuria, unspecified: Secondary | ICD-10-CM | POA: Diagnosis not present

## 2018-04-01 DIAGNOSIS — F331 Major depressive disorder, recurrent, moderate: Secondary | ICD-10-CM

## 2018-04-01 DIAGNOSIS — I10 Essential (primary) hypertension: Secondary | ICD-10-CM | POA: Diagnosis not present

## 2018-04-01 DIAGNOSIS — E1142 Type 2 diabetes mellitus with diabetic polyneuropathy: Secondary | ICD-10-CM | POA: Diagnosis not present

## 2018-04-01 DIAGNOSIS — Z0000 Encounter for general adult medical examination without abnormal findings: Secondary | ICD-10-CM | POA: Diagnosis not present

## 2018-04-01 DIAGNOSIS — G6181 Chronic inflammatory demyelinating polyneuritis: Secondary | ICD-10-CM | POA: Insufficient documentation

## 2018-04-01 DIAGNOSIS — E1159 Type 2 diabetes mellitus with other circulatory complications: Secondary | ICD-10-CM | POA: Diagnosis not present

## 2018-04-01 DIAGNOSIS — N183 Chronic kidney disease, stage 3 (moderate): Secondary | ICD-10-CM | POA: Diagnosis not present

## 2018-04-01 DIAGNOSIS — E1122 Type 2 diabetes mellitus with diabetic chronic kidney disease: Secondary | ICD-10-CM | POA: Diagnosis not present

## 2018-04-01 DIAGNOSIS — Z794 Long term (current) use of insulin: Secondary | ICD-10-CM | POA: Diagnosis not present

## 2018-04-01 MED ORDER — ROSUVASTATIN CALCIUM 20 MG PO TABS: 20.0000 mg | ORAL_TABLET | Freq: Every day | ORAL | 1 refills | Status: DC

## 2018-04-01 MED ORDER — AMLODIPINE BESYLATE 10 MG PO TABS: ORAL_TABLET | ORAL | 1 refills | Status: DC

## 2018-04-01 MED ORDER — CARVEDILOL 12.5 MG PO TABS: 12.5000 mg | ORAL_TABLET | Freq: Two times a day (BID) | ORAL | 1 refills | Status: DC

## 2018-04-01 MED ORDER — VENLAFAXINE HCL 100 MG PO TABS: 100.0000 mg | ORAL_TABLET | Freq: Two times a day (BID) | ORAL | 1 refills | Status: DC

## 2018-04-01 MED ORDER — HEPARIN SOD (PORK) LOCK FLUSH 100 UNIT/ML IV SOLN
500.0000 [IU] | Freq: Once | INTRAVENOUS | Status: AC
  Administered 2018-04-01: 500 [IU] via INTRAVENOUS

## 2018-04-01 MED ORDER — LORATADINE 10 MG PO TABS: 10.0000 mg | ORAL_TABLET | Freq: Every day | ORAL | 3 refills | Status: DC

## 2018-04-01 MED ORDER — SODIUM CHLORIDE 0.9% FLUSH
10.0000 mL | INTRAVENOUS | Status: DC | PRN
  Administered 2018-04-01: 10 mL via INTRAVENOUS
  Filled 2018-04-01: qty 10

## 2018-04-01 MED ORDER — CARVEDILOL 12.5 MG PO TABS: 25.0000 mg | ORAL_TABLET | Freq: Two times a day (BID) | ORAL | 1 refills | Status: DC

## 2018-04-01 MED ORDER — LOSARTAN POTASSIUM-HCTZ 100-25 MG PO TABS: 1.0000 | ORAL_TABLET | Freq: Every day | ORAL | 1 refills | Status: DC

## 2018-04-01 NOTE — Progress Notes (Signed)
Subjective:   Phillip Fitzgerald is a 67 y.o. male who presents for an Initial Medicare Annual Wellness Visit.  Review of Systems   Cardiac Risk Factors include: advanced age (>29men, >68 women);hypertension;diabetes mellitus;dyslipidemia    Objective:    Today's Vitals   04/01/18 1128  BP: 138/80  Pulse: 80  Resp: 16  Weight: 215 lb (97.5 kg)  Height: 6\' 2"  (1.88 m)   Body mass index is 27.6 kg/m.  Advanced Directives 04/01/2018 01/28/2018 11/23/2017 10/15/2017 09/10/2017 05/14/2017 01/15/2017  Does Patient Have a Medical Advance Directive? No No No No No No No  Would patient like information on creating a medical advance directive? Yes (MAU/Ambulatory/Procedural Areas - Information given) No - Patient declined - Yes (MAU/Ambulatory/Procedural Areas - Information given) No - Patient declined Yes (MAU/Ambulatory/Procedural Areas - Information given) -    Current Medications (verified) Outpatient Encounter Medications as of 04/01/2018  Medication Sig  . amLODipine (NORVASC) 10 MG tablet TAKE 1 TABLET BY MOUTH ONCE DAILY. NEED APPOINTMENT FOR FURTHER REFILLS  . carvedilol (COREG) 12.5 MG tablet Take 2 tablets (25 mg total) by mouth 2 (two) times daily with a meal. TAKE 1 TABLET (12.5 MG TOTAL) BY MOUTH 2 (TWO) TIMES DAILY WITH A MEAL.  . Dulaglutide (TRULICITY) 9.56 OZ/3.0QM SOPN Inject into the skin once a week. Dr Honor Junes  . fluticasone (FLONASE) 50 MCG/ACT nasal spray Place 2 sprays into both nostrils daily.  . Immune Globulin 10% (GAMUNEX-C) 10 GM/100ML SOLN Inject into the vein every 4 (four) months. 50 grams- Dr Shah/ now every 5 months  . Insulin Glargine (LANTUS SOLOSTAR) 100 UNIT/ML Solostar Pen INJECT 52 UNITS UNDER THE SKIN AT BEDTIME- Dr Maretta Bees  . Insulin Pen Needle (FIFTY50 PEN NEEDLES) 31G X 8 MM MISC Use 4 (four) times daily. DX: E11.8  . loratadine (CLARITIN) 10 MG tablet Take 1 tablet (10 mg total) by mouth daily.  Marland Kitchen losartan-hydrochlorothiazide (HYZAAR) 100-25 MG tablet  Take 1 tablet by mouth daily.  . metFORMIN (GLUCOPHAGE) 500 MG tablet Take 1 tablet (500 mg total) by mouth 2 (two) times daily. (Patient taking differently: Take 500 mg by mouth 2 (two) times daily. Dr Honor Junes)  . NOVOLOG FLEXPEN 100 UNIT/ML FlexPen Dr Honor Junes  . rosuvastatin (CRESTOR) 20 MG tablet Take 1 tablet (20 mg total) by mouth daily.  Marland Kitchen venlafaxine (EFFEXOR) 100 MG tablet Take 1 tablet (100 mg total) by mouth 2 (two) times daily.   No facility-administered encounter medications on file as of 04/01/2018.     Allergies (verified) Penicillins   History: Past Medical History:  Diagnosis Date  . CIDP (chronic inflammatory demyelinating polyneuropathy) (HCC)    left leg weakness  . CIDP (chronic inflammatory demyelinating polyneuropathy) ([place])   . Diabetes mellitus without complication ([place])   . Elevated lipids   . Hypertension   . Sleep apnea    CPAP   Past Surgical History:  Procedure Laterality Date  . APPENDECTOMY    . COLONOSCOPY  2011   cleared for 5 yrs- Dr Allen Norris  . COLONOSCOPY WITH PROPOFOL N/A 10/15/2017   Procedure: COLONOSCOPY WITH PROPOFOL;  Surgeon: [name], MD;  Location: [place];  Service: Endoscopy;  Laterality: N/A;  Diabetic and sleep apnea  . KNEE SURGERY Right    scope  . POLYPECTOMY  10/15/2017   Procedure: POLYPECTOMY;  Surgeon: [name], MD;  Location: [place];  Service: Endoscopy;;  . PORTACATH PLACEMENT Right 09/16/2015   Procedure: INSERTION PORT-A-CATH;  Surgeon: Leonie Green,  MD;  Location: [facility];  Service: General;  Laterality: Right;  . TONSILLECTOMY     Family History  Problem Relation Age of Onset  . Diabetes Father   . Heart disease Father   . Angina Mother   . Macular degeneration Mother   . Lung cancer Sister    Social History   Socioeconomic History  . Marital status: Married    Spouse name: Not on file  . Number of children: 3  . Years of education: Not on file  . Highest education  level: High school graduate  Occupational History  . Occupation: retired  Scientific laboratory technician  . Financial resource strain: Not hard at all  . Food insecurity:    Worry: Never true    Inability: Never true  . Transportation needs:    Medical: No    Non-medical: No  Tobacco Use  . Smoking status: Former Smoker    Last attempt to quit: 05/08/1978    Years since quitting: 39.9  . Smokeless tobacco: Never Used  Substance and Sexual Activity  . Alcohol use: Yes    Alcohol/week: 3.0 standard drinks    Types: 3 Cans of beer per week  . Drug use: No  . Sexual activity: Yes  Lifestyle  . Physical activity:    Days per week: 0 days    Minutes per session: 0 min  . Stress: Not at all  Relationships  . Social connections:    Talks on phone: More than three times a week    Gets together: More than three times a week    Attends religious service: More than 4 times per year    Active member of club or organization: No    Attends meetings of clubs or organizations: Never    Relationship status: Married  Other Topics Concern  . Not on file  Social History Narrative   Pt and his wife are in the process of moving to [place] given: Not Answered   Clinical Intake:  Pre-visit preparation completed: Yes  Pain : No/denies pain     BMI - recorded: 27.61 Nutritional Status: BMI 25 -29 Overweight Diabetes: Yes CBG done?: No Did pt. bring in CBG monitor from home?: No   Nutrition Risk Assessment:  Has the patient had any N/V/D within the last 2 months?  Yes  stomach virus recently with diarrhea but feeling better today. Does the patient have any non-healing wounds?  No  Has the patient had any unintentional weight loss or weight gain?  No   Diabetes:  Is the patient diabetic?  Yes  If diabetic, was a CBG obtained today?  No  Did the patient bring in their glucometer from home?  No  How often do you monitor your CBG's? 4-5 times per day using  freestyle libre.   Financial Strains and Diabetes Management:  Are you having any financial strains with the device, your supplies or your medication? No .  Does the patient want to be seen by Chronic Care Management for management of their diabetes?  No  Would the patient like to be referred to a Nutritionist or for Diabetic Management?  No  pt has completed DSMT  Diabetic Exams:  Diabetic Eye Exam: Completed 11/05/17.   Diabetic Foot Exam: Completed 12/14/17.   How often do you need to have someone help you when you read instructions, pamphlets, or other written materials from your doctor or pharmacy?: 1 - Never What is the  last grade level you completed in school?: 12th grade  Interpreter Needed?: No  Information entered by :: Clemetine Marker LPN  Activities of Daily Living In your present state of health, do you have any difficulty performing the following activities: 04/01/2018 10/15/2017  Hearing? N N  Comment declines hearing aids -  Vision? N N  Comment wears eyeglasses -  Difficulty concentrating or making decisions? N N  Walking or climbing stairs? Y N  Comment Stairs and uneven ground cause difficulty and pt uses a cane for this -  Dressing or bathing? N N  Doing errands, shopping? N -  Preparing Food and eating ? N -  Using the Toilet? N -  In the past six months, have you accidently leaked urine? N -  Do you have problems with loss of bowel control? N -  Managing your Medications? N -  Managing your Finances? N -  Housekeeping or managing your Housekeeping? N -  Some recent data might be hidden     Immunizations and Health Maintenance Immunization History  Administered Date(s) Administered  . Pneumococcal Conjugate-13 11/12/2017   There are no preventive care reminders to display for this patient.  Patient Care Team: Juline Patch, MD as PCP - General (Family Medicine) Vladimir Crofts, MD as Consulting Physician (Neurology)  Indicate any recent Medical  Services you may have received from other than [facility] providers in the past year (date may be approximate).    Assessment:   This is a routine wellness examination for Ellis.  Hearing/Vision screen Hearing Screening Comments: Pt has not difficulty hearing  Vision Screening Comments: Annual eye exams [facility]  Dietary issues and exercise activities discussed: Current Exercise Habits: The patient does not participate in regular exercise at present, Exercise limited by: orthopedic condition(s);neurologic condition(s)  Goals    . Increase physical activity     Increase physical activity to 30 minutes per day 3 days per week      Depression Screen PHQ 2/9 Scores 04/01/2018 04/01/2018 09/26/2017 12/07/2016  PHQ - 2 Score 0 0 0 1  PHQ- 9 Score 3 3 2 3     Fall Risk Fall Risk  04/01/2018 04/01/2018 09/26/2017 06/22/2016 02/08/2016  Falls in the past year? 1 1 Yes No No  Number falls in past yr: 1 0 1 - -  Injury with Fall? 0 0 No - -  Risk for fall due to : - - - - -  Follow up - Falls evaluation completed - - -    FALL RISK PREVENTION PERTAINING TO THE HOME:  Any stairs in or around the home WITH handrails? Yes  Home free of loose throw rugs in walkways, pet beds, electrical cords, etc? Yes  Adequate lighting in your home to reduce risk of falls? Yes   ASSISTIVE DEVICES UTILIZED TO PREVENT FALLS:  Life alert? No  Use of a cane, walker or w/c? Yes  Grab bars in the bathroom? Yes  Shower chair or bench in shower? Yes  Elevated toilet seat or a handicapped toilet? No   DME ORDERS:  DME order needed?  No   TIMED UP AND GO:  Was the test performed? Yes .  Length of time to ambulate 10 feet: 6 sec.   GAIT:  Appearance of gait: Gait stead-fast and without the use of an assistive device. Education: Fall risk prevention has been discussed.  Intervention(s) required? No   Cognitive Function:        Screening Tests Health  Maintenance  Topic Date Due  .  TETANUS/TDAP  04/02/2019 (Originally 04/18/1970)  . Hepatitis C Screening  04/02/2019 (Originally 17-Oct-1950)  . INFLUENZA VACCINE  04/22/2019 (Originally 12/06/2017)  . HEMOGLOBIN A1C  06/16/2018  . OPHTHALMOLOGY EXAM  11/06/2018  . PNA vac Low Risk Adult (2 of 2 - PPSV23) 11/13/2018  . FOOT EXAM  12/15/2018  . COLONOSCOPY  10/16/2022    Qualifies for Shingles Vaccine? Yes   Due for Shingrix. Education has been provided regarding the importance of this vaccine. Pt has been advised to call insurance company to determine out of pocket expense. Advised may also receive vaccine at local pharmacy or Health Dept. Verbalized acceptance and understanding.Advised pt to discuss with neurologist due to CIDP.  Tdap: Although this vaccine is not a covered service during a Wellness Exam, does the patient still wish to receive this vaccine today?  No .  Education has been provided regarding the importance of this vaccine. Advised may receive this vaccine at local pharmacy or Health Dept. Aware to provide a copy of the vaccination record if obtained from local pharmacy or Health Dept. Verbalized acceptance and understanding.  Flu Vaccine: not recommended per neurologist.   Pneumococcal Vaccine: Up to date  Cancer Screenings:  Colorectal Screening: Completed 10/15/17. Repeat every 5 years;   Lung Cancer Screening: (Low Dose CT Chest recommended if Age 84-80 years, 30 pack-year currently smoking OR have quit w/in 15years.) does not qualify.    Additional Screening:  Hepatitis C Screening: does qualify; declined  Vision Screening: Recommended annual ophthalmology exams for early detection of glaucoma and other disorders of the eye. Is the patient up to date with their annual eye exam?  Yes  Who is the provider or what is the name of the office in which the pt attends annual eye exams? [facility]    Dental Screening: Recommended annual dental exams for proper oral  hygiene  Community Resource Referral:  CRR required this visit?  No        Plan:    I have personally reviewed and addressed the Medicare Annual Wellness questionnaire and have noted the following in the patient's chart:  A. Medical and social history B. Use of alcohol, tobacco or illicit drugs  C. Current medications and supplements D. Functional ability and status E.  Nutritional status F.  Physical activity G. Advance directives H. List of other physicians I.  Hospitalizations, surgeries, and ER visits in previous 12 months J.  [place] such as hearing and vision if needed, cognitive and depression L. Referrals and appointments   In addition, I have reviewed and discussed with patient certain preventive protocols, quality metrics, and best practice recommendations. A written personalized care plan for preventive services as well as general preventive health recommendations were provided to patient.   Signed,  Clemetine Marker, LPN Nurse Health Advisor   Nurse Notes: Pt doing well and appreciative of visit. He will be moving to the [place] area and transferring care there when available.

## 2018-04-01 NOTE — Progress Notes (Signed)
Date:  04/01/2018   Name:  Phillip Fitzgerald   DOB:  [birth year]   MRN:  [id number]   Chief Complaint: Depression (PHQ9=3); Hyperlipidemia (only needs lipid level); and Hypertension Depression         This is a chronic problem.  The current episode started more than 1 year ago.   The onset quality is gradual.   The problem occurs every several days.  The problem has been waxing and waning since onset.  Associated symptoms include no decreased concentration, no fatigue, no helplessness, no hopelessness, does not have insomnia, not irritable, no restlessness, no decreased interest, no appetite change, no body aches, no myalgias, no headaches, no indigestion, not sad and no suicidal ideas.     The symptoms are aggravated by nothing.  Past treatments include SNRIs - Serotonin and norepinephrine reuptake inhibitors.  Compliance with treatment is good.  Previous treatment provided mild relief.   Pertinent negatives include no hypothyroidism and no anxiety. Hyperlipidemia  This is a chronic problem. The current episode started more than 1 year ago. The problem is controlled. Recent lipid tests were reviewed and are normal. He has no history of chronic renal disease, diabetes, hypothyroidism, liver disease, obesity or nephrotic syndrome. There are no known factors aggravating his hyperlipidemia. Pertinent negatives include no chest pain, focal sensory loss, focal weakness, leg pain, myalgias or shortness of breath. Current antihyperlipidemic treatment includes statins.  Hypertension  This is a chronic problem. The current episode started more than 1 year ago. The problem is unchanged. The problem is controlled. Pertinent negatives include no anxiety, blurred vision, chest pain, headaches, malaise/fatigue, neck pain, orthopnea, palpitations, peripheral edema, PND, shortness of breath or sweats. There are no associated agents to hypertension. There are no known risk factors for coronary artery disease. Past  treatments include angiotensin blockers, diuretics, calcium channel blockers and beta blockers. The current treatment provides moderate improvement. There are no compliance problems.  There is no history of angina, kidney disease, CAD/MI, CVA, heart failure, left ventricular hypertrophy, PVD or retinopathy. There is no history of chronic renal disease, a hypertension causing med or renovascular disease.     Review of Systems  Constitutional: Negative for appetite change, chills, fatigue, fever and malaise/fatigue.  HENT: Negative for drooling, ear discharge, ear pain and sore throat.   Eyes: Negative for blurred vision.  Respiratory: Negative for cough, shortness of breath and wheezing.   Cardiovascular: Negative for chest pain, palpitations, orthopnea, leg swelling and PND.  Gastrointestinal: Negative for abdominal pain, blood in stool, constipation, diarrhea and nausea.  Endocrine: Negative for polydipsia.  Genitourinary: Negative for dysuria, frequency, hematuria and urgency.  Musculoskeletal: Negative for back pain, myalgias and neck pain.  Skin: Negative for rash.  Allergic/Immunologic: Negative for environmental allergies.  Neurological: Negative for dizziness, focal weakness and headaches.  Hematological: Does not bruise/bleed easily.  Psychiatric/Behavioral: Positive for depression. Negative for decreased concentration and suicidal ideas. The patient is not nervous/anxious and does not have insomnia.     Patient Active Problem List   Diagnosis Date Noted  . Special screening for malignant neoplasms, colon   . Benign neoplasm of transverse colon   . Polyp of sigmoid colon   . Moderate episode of recurrent major depressive disorder ([place]) 09/26/2017  . Hyperlipidemia 12/28/2014  . Adiposity 11/02/2014  . Chronic inflammatory demyelinating polyneuritis ([place]) 09/10/2013  . Diabetes ([place]) 09/10/2013  . Essential hypertension 09/10/2013  . Obstructive apnea 09/10/2013  . Allergic  rhinitis, seasonal 09/10/2013  Allergies  Allergen Reactions  . Penicillins Other (See Comments)    Unknown reaction from chilhood    Past Surgical History:  Procedure Laterality Date  . APPENDECTOMY    . COLONOSCOPY  2011   cleared for 5 yrs- Dr Allen Norris  . COLONOSCOPY WITH PROPOFOL N/A 10/15/2017   Procedure: COLONOSCOPY WITH PROPOFOL;  Surgeon: [name], MD;  Location: [facility] [place];  Service: Endoscopy;  Laterality: N/A;  Diabetic and sleep apnea  . KNEE SURGERY Right    scope  . POLYPECTOMY  10/15/2017   Procedure: POLYPECTOMY;  Surgeon: [name], MD;  Location: [place];  Service: Endoscopy;;  . PORTACATH PLACEMENT Right 09/16/2015   Procedure: INSERTION PORT-A-CATH;  Surgeon: Leonie Green, MD;  Location: [facility];  Service: General;  Laterality: Right;  . TONSILLECTOMY      Social History   Tobacco Use  . Smoking status: Former Smoker    Last attempt to quit: 05/08/1978    Years since quitting: 39.9  . Smokeless tobacco: Never Used  Substance Use Topics  . Alcohol use: Yes    Alcohol/week: 3.0 standard drinks    Types: 3 Cans of beer per week  . Drug use: No     Medication list has been reviewed and updated.  Current Meds  Medication Sig  . amLODipine (NORVASC) 10 MG tablet TAKE 1 TABLET BY MOUTH ONCE DAILY. NEED APPOINTMENT FOR FURTHER REFILLS  . carvedilol (COREG) 12.5 MG tablet Take 2 tablets (25 mg total) by mouth 2 (two) times daily with a meal. TAKE 1 TABLET (12.5 MG TOTAL) BY MOUTH 2 (TWO) TIMES DAILY WITH A MEAL.  . Dulaglutide (TRULICITY) 1.61 WR/6.0AV SOPN Inject into the skin once a week. Dr Honor Junes  . fluticasone (FLONASE) 50 MCG/ACT nasal spray Place 2 sprays into both nostrils daily.  . Immune Globulin 10% (GAMUNEX-C) 10 GM/100ML SOLN Inject into the vein every 4 (four) months. 50 grams- Dr Shah/ now every 5 months  . Insulin Glargine (LANTUS SOLOSTAR) 100 UNIT/ML Solostar Pen INJECT 52 UNITS UNDER THE SKIN AT BEDTIME-  Dr Maretta Bees  . Insulin Pen Needle (FIFTY50 PEN NEEDLES) 31G X 8 MM MISC Use 4 (four) times daily. DX: E11.8  . loratadine (CLARITIN) 10 MG tablet Take 1 tablet (10 mg total) by mouth daily.  Marland Kitchen losartan-hydrochlorothiazide (HYZAAR) 100-25 MG tablet Take 1 tablet by mouth daily.  . metFORMIN (GLUCOPHAGE) 500 MG tablet Take 1 tablet (500 mg total) by mouth 2 (two) times daily. (Patient taking differently: Take 500 mg by mouth 2 (two) times daily. Dr Honor Junes)  . NOVOLOG FLEXPEN 100 UNIT/ML FlexPen Dr Honor Junes  . rosuvastatin (CRESTOR) 20 MG tablet Take 1 tablet (20 mg total) by mouth daily.  Marland Kitchen venlafaxine (EFFEXOR) 100 MG tablet Take 1 tablet (100 mg total) by mouth 2 (two) times daily.  . [DISCONTINUED] amLODipine (NORVASC) 10 MG tablet TAKE 1 TABLET BY MOUTH ONCE DAILY. NEED APPOINTMENT FOR FURTHER REFILLS  . [DISCONTINUED] carvedilol (COREG) 12.5 MG tablet Take 2 tablets (25 mg total) by mouth 2 (two) times daily with a meal. TAKE 1 TABLET (12.5 MG TOTAL) BY MOUTH 2 (TWO) TIMES DAILY WITH A MEAL.  . [DISCONTINUED] loratadine (CLARITIN) 10 MG tablet Take 1 tablet (10 mg total) by mouth daily.  . [DISCONTINUED] losartan-hydrochlorothiazide (HYZAAR) 100-25 MG tablet Take 1 tablet by mouth daily.  . [DISCONTINUED] meloxicam (MOBIC) 7.5 MG tablet Take 1 tablet (7.5 mg total) by mouth daily.  . [DISCONTINUED] rosuvastatin (CRESTOR) 20 MG tablet Take  1 tablet (20 mg total) by mouth daily.  . [DISCONTINUED] venlafaxine (EFFEXOR) 100 MG tablet Take 1 tablet (100 mg total) by mouth 2 (two) times daily.    PHQ 2/9 Scores 04/01/2018 04/01/2018 09/26/2017 12/07/2016  PHQ - 2 Score 0 0 0 1  PHQ- 9 Score 3 3 2 3     Physical Exam  Constitutional: He is oriented to person, place, and time. He appears well-developed and well-nourished. He is not irritable.  HENT:  Head: Normocephalic.  Right Ear: External ear normal.  Left Ear: External ear normal.  Nose: Nose normal.  Mouth/Throat: Oropharynx is clear and  moist.  Eyes: Pupils are equal, round, and reactive to light. Conjunctivae and EOM are normal. Right eye exhibits no discharge. Left eye exhibits no discharge. No scleral icterus.  Neck: Normal range of motion. Neck supple. No JVD present. No tracheal deviation present. No thyromegaly present.  Cardiovascular: Normal rate, regular rhythm, normal heart sounds and intact distal pulses. Exam reveals no gallop and no friction rub.  No murmur heard. Pulmonary/Chest: Breath sounds normal. No respiratory distress. He has no wheezes. He has no rales.  Abdominal: Soft. Bowel sounds are normal. He exhibits no mass. There is no hepatosplenomegaly. There is no tenderness. There is no rebound, no guarding and no CVA tenderness.  Musculoskeletal: Normal range of motion. He exhibits no edema or tenderness.  Lymphadenopathy:    He has no cervical adenopathy.  Neurological: He is alert and oriented to person, place, and time. He has normal strength and normal reflexes. No cranial nerve deficit.  Skin: Skin is warm. No rash noted.  Nursing note and vitals reviewed.   BP 138/80   Pulse 80   Ht 6\' 2"  (1.88 m)   Wt 215 lb (97.5 kg)   BMI 27.60 kg/m   Assessment and Plan:  1. Chronic seasonal allergic rhinitis due to pollen Stable on med- refill loratadine - loratadine (CLARITIN) 10 MG tablet; Take 1 tablet (10 mg total) by mouth daily.  Dispense: 90 tablet; Refill: 3  2. Essential hypertension Stable on meds- refill amlodipine , carvedilol and losartan/HCTZ/ renal panel was drawn in Sept at Dr Elroy Channel office - carvedilol (COREG) 12.5 MG tablet; Take 2 tablets (25 mg total) by mouth 2 (two) times daily with a meal. TAKE 1 TABLET (12.5 MG TOTAL) BY MOUTH 2 (TWO) TIMES DAILY WITH A MEAL.  Dispense: 180 tablet; Refill: 1 - losartan-hydrochlorothiazide (HYZAAR) 100-25 MG tablet; Take 1 tablet by mouth daily.  Dispense: 90 tablet; Refill: 1 - amLODipine (NORVASC) 10 MG tablet; TAKE 1 TABLET BY MOUTH ONCE DAILY.  NEED APPOINTMENT FOR FURTHER REFILLS  Dispense: 90 tablet; Refill: 1  3. Mixed hyperlipidemia Stable on med- refill crestor/ draw lipid at next visit - rosuvastatin (CRESTOR) 20 MG tablet; Take 1 tablet (20 mg total) by mouth daily.  Dispense: 90 tablet; Refill: 1  4. Moderate episode of recurrent major depressive disorder (HCC) Stable on med- refill venlafaxine - venlafaxine (EFFEXOR) 100 MG tablet; Take 1 tablet (100 mg total) by mouth 2 (two) times daily.  Dispense: 180 tablet; Refill: 1  Dr. Mac[facility]  04/01/2018

## 2018-04-01 NOTE — Patient Instructions (Signed)
Mr. Phillip Fitzgerald , Thank you for taking time to come for your Medicare Wellness Visit. I appreciate your ongoing commitment to your health goals. Please review the following plan we discussed and let me know if I can assist you in the future.   Screening recommendations/referrals: Colonoscopy: done 10/15/17 repeat in 5 years Recommended yearly ophthalmology/optometry visit for glaucoma screening and checkup Recommended yearly dental visit for hygiene and checkup  Vaccinations: Influenza vaccine: postponed due to provider recommendation Pneumococcal vaccine: done 11/12/17 Tdap vaccine: due please contact us if you get a cut or scrape Shingles vaccine: Shingrix discussed. Please get clearance from your neurologist and contact your insurance company for coverage information.     Advanced directives: Advance directive discussed with you today. I have provided a copy for you to complete at home and have notarized. Once this is complete please bring a copy in to our office so we can scan it into your chart.  Conditions/risks identified: Recommend increase physical activity to 30 minutes per day 3 times per week.   Next appointment: Please follow up in one year for your Medicare Annual Wellness visit.    Preventive Care 68 Years and Older, Male Preventive care refers to lifestyle choices and visits with your health care provider that can promote health and wellness. What does preventive care include?  A yearly physical exam. This is also called an annual well check.  Dental exams once or twice a year.  Routine eye exams. Ask your health care provider how often you should have your eyes checked.  Personal lifestyle choices, including:  Daily care of your teeth and gums.  Regular physical activity.  Eating a healthy diet.  Avoiding tobacco and drug use.  Limiting alcohol use.  Practicing safe sex.  Taking low doses of aspirin every day.  Taking vitamin and mineral supplements as  recommended by your health care provider. What happens during an annual well check? The services and screenings done by your health care provider during your annual well check will depend on your age, overall health, lifestyle risk factors, and family history of disease. Counseling  Your health care provider may ask you questions about your:  Alcohol use.  Tobacco use.  Drug use.  Emotional well-being.  Home and relationship well-being.  Sexual activity.  Eating habits.  History of falls.  Memory and ability to understand (cognition).  Work and work Statistician. Screening  You may have the following tests or measurements:  Height, weight, and BMI.  Blood pressure.  Lipid and cholesterol levels. These may be checked every 5 years, or more frequently if you are over 87 years old.  Skin check.  Lung cancer screening. You may have this screening every year starting at age 50 if you have a 30-pack-year history of smoking and currently smoke or have quit within the past 15 years.  Fecal occult blood test (FOBT) of the stool. You may have this test every year starting at age 42.  Flexible sigmoidoscopy or colonoscopy. You may have a sigmoidoscopy every 5 years or a colonoscopy every 10 years starting at age 23.  Prostate cancer screening. Recommendations will vary depending on your family history and other risks.  Hepatitis C blood test.  Hepatitis B blood test.  Sexually transmitted disease (STD) testing.  Diabetes screening. This is done by checking your blood sugar (glucose) after you have not eaten for a while (fasting). You may have this done every 1-3 years.  Abdominal aortic aneurysm (AAA) screening. You may need  this if you are a current or former smoker.  Osteoporosis. You may be screened starting at age 61 if you are at high risk. Talk with your health care provider about your test results, treatment options, and if necessary, the need for more  tests. Vaccines  Your health care provider may recommend certain vaccines, such as:  Influenza vaccine. This is recommended every year.  Tetanus, diphtheria, and acellular pertussis (Tdap, Td) vaccine. You may need a Td booster every 10 years.  Zoster vaccine. You may need this after age 22.  Pneumococcal 13-valent conjugate (PCV13) vaccine. One dose is recommended after age 66.  Pneumococcal polysaccharide (PPSV23) vaccine. One dose is recommended after age 46. Talk to your health care provider about which screenings and vaccines you need and how often you need them. This information is not intended to replace advice given to you by your health care provider. Make sure you discuss any questions you have with your health care provider. Document Released: 05/21/2015 Document Revised: 01/12/2016 Document Reviewed: 02/23/2015 Elsevier Interactive Patient Education  2017 [employer] Prevention in the Home Falls can cause injuries. They can happen to people of all ages. There are many things you can do to make your home safe and to help prevent falls. What can I do on the outside of my home?  Regularly fix the edges of walkways and driveways and fix any cracks.  Remove anything that might make you trip as you walk through a door, such as a raised step or threshold.  Trim any bushes or trees on the path to your home.  Use bright outdoor lighting.  Clear any walking paths of anything that might make someone trip, such as rocks or tools.  Regularly check to see if handrails are loose or broken. Make sure that both sides of any steps have handrails.  Any raised decks and porches should have guardrails on the edges.  Have any leaves, snow, or ice cleared regularly.  Use sand or salt on walking paths during winter.  Clean up any spills in your garage right away. This includes oil or grease spills. What can I do in the bathroom?  Use night lights.  Install grab bars by the  toilet and in the tub and shower. Do not use towel bars as grab bars.  Use non-skid mats or decals in the tub or shower.  If you need to sit down in the shower, use a plastic, non-slip stool.  Keep the floor dry. Clean up any water that spills on the floor as soon as it happens.  Remove soap buildup in the tub or shower regularly.  Attach bath mats securely with double-sided non-slip rug tape.  Do not have throw rugs and other things on the floor that can make you trip. What can I do in the bedroom?  Use night lights.  Make sure that you have a light by your bed that is easy to reach.  Do not use any sheets or blankets that are too big for your bed. They should not hang down onto the floor.  Have a firm chair that has side arms. You can use this for support while you get dressed.  Do not have throw rugs and other things on the floor that can make you trip. What can I do in the kitchen?  Clean up any spills right away.  Avoid walking on wet floors.  Keep items that you use a lot in easy-to-reach places.  If you  need to reach something above you, use a strong step stool that has a grab bar.  Keep electrical cords out of the way.  Do not use floor polish or wax that makes floors slippery. If you must use wax, use non-skid floor wax.  Do not have throw rugs and other things on the floor that can make you trip. What can I do with my stairs?  Do not leave any items on the stairs.  Make sure that there are handrails on both sides of the stairs and use them. Fix handrails that are broken or loose. Make sure that handrails are as long as the stairways.  Check any carpeting to make sure that it is firmly attached to the stairs. Fix any carpet that is loose or worn.  Avoid having throw rugs at the top or bottom of the stairs. If you do have throw rugs, attach them to the floor with carpet tape.  Make sure that you have a light switch at the top of the stairs and the bottom of  the stairs. If you do not have them, ask someone to add them for you. What else can I do to help prevent falls?  Wear shoes that:  Do not have high heels.  Have rubber bottoms.  Are comfortable and fit you well.  Are closed at the toe. Do not wear sandals.  If you use a stepladder:  Make sure that it is fully opened. Do not climb a closed stepladder.  Make sure that both sides of the stepladder are locked into place.  Ask someone to hold it for you, if possible.  Clearly mark and make sure that you can see:  Any grab bars or handrails.  First and last steps.  Where the edge of each step is.  Use tools that help you move around (mobility aids) if they are needed. These include:  Canes.  Walkers.  Scooters.  Crutches.  Turn on the lights when you go into a dark area. Replace any light bulbs as soon as they burn out.  Set up your furniture so you have a clear path. Avoid moving your furniture around.  If any of your floors are uneven, fix them.  If there are any pets around you, be aware of where they are.  Review your medicines with your doctor. Some medicines can make you feel dizzy. This can increase your chance of falling. Ask your doctor what other things that you can do to help prevent falls. This information is not intended to replace advice given to you by your health care provider. Make sure you discuss any questions you have with your health care provider. Document Released: 02/18/2009 Document Revised: 09/30/2015 Document Reviewed: 05/29/2014 Elsevier Interactive Patient Education  2017 [employer].

## 2018-04-12 DIAGNOSIS — R0982 Postnasal drip: Secondary | ICD-10-CM | POA: Diagnosis not present

## 2018-04-12 DIAGNOSIS — H6091 Unspecified otitis externa, right ear: Secondary | ICD-10-CM | POA: Diagnosis not present

## 2018-04-12 DIAGNOSIS — R0981 Nasal congestion: Secondary | ICD-10-CM | POA: Diagnosis not present

## 2018-05-23 ENCOUNTER — Inpatient Hospital Stay: Payer: Medicare Other | Attending: Oncology

## 2018-05-23 DIAGNOSIS — G6181 Chronic inflammatory demyelinating polyneuritis: Secondary | ICD-10-CM | POA: Diagnosis not present

## 2018-05-23 DIAGNOSIS — Z452 Encounter for adjustment and management of vascular access device: Secondary | ICD-10-CM | POA: Insufficient documentation

## 2018-05-23 DIAGNOSIS — Z95828 Presence of other vascular implants and grafts: Secondary | ICD-10-CM

## 2018-05-23 MED ORDER — HEPARIN SOD (PORK) LOCK FLUSH 100 UNIT/ML IV SOLN
500.0000 [IU] | Freq: Once | INTRAVENOUS | Status: AC
  Administered 2018-05-23: 500 [IU] via INTRAVENOUS

## 2018-05-23 MED ORDER — SODIUM CHLORIDE 0.9% FLUSH
10.0000 mL | INTRAVENOUS | Status: DC | PRN
  Administered 2018-05-23: 10 mL via INTRAVENOUS
  Filled 2018-05-23: qty 10

## 2018-06-19 DIAGNOSIS — H2513 Age-related nuclear cataract, bilateral: Secondary | ICD-10-CM | POA: Diagnosis not present

## 2018-06-29 ENCOUNTER — Other Ambulatory Visit: Payer: Self-pay | Admitting: Hematology and Oncology

## 2018-06-29 NOTE — Progress Notes (Signed)
[facility]     [address]     [place], [place] [address]     Phone: [phone]      Fax: [phone]        Clinic day:  07/01/2018  Chief Complaint: Phillip Fitzgerald is a 68 y.o. male with chronic inflammatary demyelinating polyneuropathy (CIDP) who is seen for new patient assessment and continuation of IVIG.  HPI: The patient was diagnosed with CIDP in 2012.  He presented with pain in his back and hip in 06/2010.  He described shooting pain down to his legs with significant atrophy of his left thigh.  He noted that it was very difficult for him to lift his whole leg up or extend his knee.  His legs felt "rubbery".  He had difficulty opening jars with right hand. He denied fasciculations in his arms or legs . EMG on 08/10/2010 revealed abnormal electrodiagnostic study consistent with generalized severe acquired and sensorimotor polyneuropathy.  There was neuropathic changes in the legs in all limbs.  Lumbar puncture revealed myelin basic protein of 0.6 normal IgG synthesis rate was -8.8 (normal).  He had 0 oligoclonal bands in his CSF.  He had a second opinion at [facility]  The diagnosis was confirmed. He is followed by Dr. Jennings Books.    He began IVIG on 08/18/2010.  He receives maintenance IVIG 2 gm/kg over 4 days every 4 months.  He was receiving infusions at home.  Last home dose was in 02/2016.  Due to insurance denial for home infusions, he did not receive his dose in 05/2016.  He was subsequently referred to the infusion center in [place].  He has tolerated his infusions well without side effects.  Notes indicate a rash in his palms at one point and a headache when he received his IVIG too fast.  He has had a good response to therapy.   He is able to walk with a cane.  He was followed by Dr. Janese Banks.  He was last seen on 01/28/2018.  At that time he was doing well.  He did have some lower extremity weakness but was overall stable.  There was concern for  chronic kidney disease.  Creatinine was 1.61 on 01/31/2018.  He was referred to nephrology.  He received IVIG.  He was seen by Dr. Manuella Ghazi on 02/14/2018.  Notes were reviewed.  He had an increase in atrophy in his right hand.  He was noted to be receiving his IVIG infusions every 5 months.  He was noted to have a diabetic nephropathy.  Notes indicated that Dr Candiss Norse would decide if IVIG needed to be decreased.  He has a follow-up on 02/15/2019.  He was seen by Dr Holley Raring.  Renal ultrasound on 03/18/2018 was normal.   Symptomatically, he feels pretty good.  He notes some visual changes secondary to cataracts.  He notes still having issues with his right hand, turning key on a lock.  OT has helped a lot.  He notes low kidney function and fluid issues.  He sees nephrology.   Past Medical History:  Diagnosis Date  . CIDP (chronic inflammatory demyelinating polyneuropathy) (HCC)    left leg weakness  . CIDP (chronic inflammatory demyelinating polyneuropathy) ([place])   . Diabetes mellitus without complication ([place])   . Elevated lipids   . Hypertension   . Sleep apnea    CPAP    Past Surgical History:  Procedure Laterality Date  . APPENDECTOMY    .  COLONOSCOPY  2011   cleared for 5 yrs- Dr Allen Norris  . COLONOSCOPY WITH PROPOFOL N/A 10/15/2017   Procedure: COLONOSCOPY WITH PROPOFOL;  Surgeon: [name], MD;  Location: [facility];  Service: Endoscopy;  Laterality: N/A;  Diabetic and sleep apnea  . KNEE SURGERY Right    scope  . POLYPECTOMY  10/15/2017   Procedure: POLYPECTOMY;  Surgeon: [name], MD;  Location: [facility];  Service: Endoscopy;;  . PORTACATH PLACEMENT Right 09/16/2015   Procedure: INSERTION PORT-A-CATH;  Surgeon: Leonie Green, MD;  Location: [facility];  Service: General;  Laterality: Right;  . TONSILLECTOMY      Family History  Problem Relation Age of Onset  . Diabetes Father   . Heart disease Father   . Angina Mother   . Macular degeneration Mother     . Lung cancer Sister     Social History:  reports that he quit smoking about 40 years ago. He has never used smokeless tobacco. He reports current alcohol use of about 3.0 standard drinks of alcohol per week. He reports that he does not use drugs.  He lives in [place], [place].  The patient is alone today.  Allergies:  Allergies  Allergen Reactions  . Penicillins Other (See Comments)    Unknown reaction from chilhood    Current Medications: Current Outpatient Medications  Medication Sig Dispense Refill  . amLODipine (NORVASC) 10 MG tablet TAKE 1 TABLET BY MOUTH ONCE DAILY. NEED APPOINTMENT FOR FURTHER REFILLS 90 tablet 1  . carvedilol (COREG) 12.5 MG tablet Take 1 tablet (12.5 mg total) by mouth 2 (two) times daily with a meal. TAKE 1 TABLET (12.5 MG TOTAL) BY MOUTH 2 (TWO) TIMES DAILY WITH A MEAL. 180 tablet 1  . Dulaglutide (TRULICITY) 2.99 BZ/1.6RC SOPN Inject into the skin once a week. Dr Honor Junes    . Immune Globulin 10% (GAMUNEX-C) 10 GM/100ML SOLN Inject into the vein every 4 (four) months. 50 grams- Dr Shah/ now every 5 months    . Insulin Glargine (LANTUS SOLOSTAR) 100 UNIT/ML Solostar Pen INJECT 52 UNITS UNDER THE SKIN AT BEDTIME- Dr Maretta Bees 15 mL 6  . Insulin Pen Needle (FIFTY50 PEN NEEDLES) 31G X 8 MM MISC Use 4 (four) times daily. DX: E11.8 100 each 6  . loratadine (CLARITIN) 10 MG tablet Take 1 tablet (10 mg total) by mouth daily. 90 tablet 3  . losartan-hydrochlorothiazide (HYZAAR) 100-25 MG tablet Take 1 tablet by mouth daily. 90 tablet 1  . metFORMIN (GLUCOPHAGE) 500 MG tablet Take 1 tablet (500 mg total) by mouth 2 (two) times daily. (Patient taking differently: Take 500 mg by mouth 2 (two) times daily. Dr Honor Junes) 60 tablet 6  . NOVOLOG FLEXPEN 100 UNIT/ML FlexPen Dr Honor Junes    . rosuvastatin (CRESTOR) 20 MG tablet Take 1 tablet (20 mg total) by mouth daily. 90 tablet 1  . venlafaxine (EFFEXOR) 100 MG tablet Take 1 tablet (100 mg total) by mouth 2 (two) times daily. 180  tablet 1   No current facility-administered medications for this visit.    Facility-Administered Medications Ordered in Other Visits  Medication Dose Route Frequency Provider Last Rate Last Dose  . heparin lock flush 100 unit/mL  500 Units Intravenous Once Corcoran, Melissa C, MD      . sodium chloride flush (NS) 0.9 % injection 10 mL  10 mL Intravenous PRN Lequita Asal, MD   10 mL at 07/01/18 [id number]    Review of Systems:  GENERAL:  Feels "pretty  good".  No fevers, sweats or weight loss. PERFORMANCE STATUS (ECOG):  1 HEENT:  Visual changes secondary to cataracts.  No runny nose, sore throat, mouth sores or tenderness. Lungs: No shortness of breath or cough.  No hemoptysis. Cardiac:  No chest pain, palpitations, orthopnea, or PND. GI:  No nausea, vomiting, diarrhea, constipation, melena or hematochezia. GU:  "Low kidney function" (followed by nephrology). No urgency, frequency, dysuria, or hematuria. Musculoskeletal:  Right hand issues.  No back pain.  No joint pain.  No muscle tenderness. Extremities:  No pain or swelling. Skin:  No rashes or skin changes. Neuro:  No headache, numbness or weakness, balance or coordination issues. Endocrine:  Diabetes.  No thyroid issues, hot flashes or night sweats. Psych:  No mood changes, depression or anxiety. Pain:  No focal pain. Review of systems:  All other systems reviewed and found to be negative.  Physical Exam: Blood pressure (!) 145/71, pulse 76, temperature 97.9 F (36.6 C), temperature source Tympanic, resp. rate 16, weight 213 lb 8.3 oz (96.9 kg), SpO2 99 %. GENERAL:  Well developed, well nourished, gentleman sitting comfortably in the exam room in no acute distress. MENTAL STATUS:  Alert and oriented to person, place and time. HEAD:  Wearing a cap. Normocephalic, atraumatic, face symmetric, no Cushingoid features. EYES:  Glasses.  Blue eyes.  Pupils equal round and reactive to light and accomodation.  No conjunctivitis or scleral  icterus. ENT:  Oropharynx clear without lesion.  Tongue normal. Mucous membranes moist.  RESPIRATORY:  Clear to auscultation without rales, wheezes or rhonchi. CARDIOVASCULAR:  Regular rate and rhythm without murmur, rub or gallop. ABDOMEN:  Soft, non-tender, with active bowel sounds, and no hepatosplenomegaly.  No masses. SKIN:  No rashes, ulcers or lesions. EXTREMITIES: Right hand atrophy.  No edema, no skin discoloration or tenderness.  No palpable cords. LYMPH NODES: No palpable cervical, supraclavicular, axillary or inguinal adenopathy  NEUROLOGICAL: Slight decreased right hand grip. PSYCH:  Appropriate.   Infusion on 07/01/2018  Component Date Value Ref Range Status  . WBC 07/01/2018 8.7  4.0 - 10.5 K/uL Final  . RBC 07/01/2018 3.99* 4.22 - 5.81 MIL/uL Final  . Hemoglobin 07/01/2018 13.4  13.0 - 17.0 g/dL Final  . HCT 07/01/2018 38.9* 39.0 - 52.0 % Final  . MCV 07/01/2018 97.5  80.0 - 100.0 fL Final  . MCH 07/01/2018 33.6  26.0 - 34.0 pg Final  . MCHC 07/01/2018 34.4  30.0 - 36.0 g/dL Final  . RDW 07/01/2018 12.4  11.5 - 15.5 % Final  . Platelets 07/01/2018 166  150 - 400 K/uL Final  . nRBC 07/01/2018 0.0  0.0 - 0.2 % Final  . Neutrophils Relative % 07/01/2018 55  % Final  . Neutro Abs 07/01/2018 4.8  1.7 - 7.7 K/uL Final  . Lymphocytes Relative 07/01/2018 31  % Final  . Lymphs Abs 07/01/2018 2.7  0.7 - 4.0 K/uL Final  . Monocytes Relative 07/01/2018 9  % Final  . Monocytes Absolute 07/01/2018 0.7  0.1 - 1.0 K/uL Final  . Eosinophils Relative 07/01/2018 4  % Final  . Eosinophils Absolute 07/01/2018 0.4  0.0 - 0.5 K/uL Final  . Basophils Relative 07/01/2018 1  % Final  . Basophils Absolute 07/01/2018 0.1  0.0 - 0.1 K/uL Final  . Immature Granulocytes 07/01/2018 0  % Final  . Abs Immature Granulocytes 07/01/2018 0.03  0.00 - 0.07 K/uL Final   Performed at [facility], [address] [place], [place] [address]  .  Sodium 07/01/2018 137  135 - 145 mmol/L Final  .  Potassium 07/01/2018 3.5  3.5 - 5.1 mmol/L Final  . Chloride 07/01/2018 94* 98 - 111 mmol/L Final  . CO2 07/01/2018 29  22 - 32 mmol/L Final  . Glucose, Bld 07/01/2018 171* 70 - 99 mg/dL Final  . BUN 07/01/2018 28* 8 - 23 mg/dL Final  . Creatinine, Ser 07/01/2018 1.57* 0.61 - 1.24 mg/dL Final  . Calcium 07/01/2018 9.0  8.9 - 10.3 mg/dL Final  . Total Protein 07/01/2018 8.3* 6.5 - 8.1 g/dL Final  . Albumin 07/01/2018 4.3  3.5 - 5.0 g/dL Final  . AST 07/01/2018 32  15 - 41 U/L Final  . ALT 07/01/2018 34  0 - 44 U/L Final  . Alkaline Phosphatase 07/01/2018 64  38 - 126 U/L Final  . Total Bilirubin 07/01/2018 0.7  0.3 - 1.2 mg/dL Final  . GFR calc non Af Amer 07/01/2018 45* >60 mL/min Final  . GFR calc Af Amer 07/01/2018 52* >60 mL/min Final  . Anion gap 07/01/2018 14  5 - 15 Final   Performed at [facility], [address] [place], [place] [address]    Assessment:  Phillip Fitzgerald is a 68 y.o. male with chronic inflammatary demyelinating polyneuropathy (CIDP).  He was diagnosed in 2012.    He began IVIG on 08/18/2010.  He receives maintenance IVIG 2 gm/kg over 4 days every 5 months.   He has chronic kidney disease.  Creatinine was 1.61 on 01/31/2018.  Renal ultrasound on 03/18/2018 was normal.   Symptomatically, he feels pretty good.  Exam reveals atrophy and weakness in intrinsic muscles of right hand.  Creatinine is 1.57.  Plan: 1.   Labs today:  CBC with diff, CMP. 2.   Chronic inflammatary demyelinating polyneuropathy (CIDP)  Review entire medical history, diagnosis and management of CIDP.  Patient receives maintenance IVIG every 5 months.  Continue IVIG 50 gm daily x 4 days.  Premeds: Tylenol and Benadryl.  Discuss role of infusion center.  Discuss treatment plan according to neurology. 3.   IVIG today and next 3 days. 4.   Port flush every 4-6 weeks. 5.   RTC in 5 months for MD assessment, labs (CBC with diff, CMP), and IVIG x 4 days.   Lequita Asal,  MD  07/01/2018, 9:12 AM

## 2018-07-01 ENCOUNTER — Inpatient Hospital Stay: Payer: Medicare Other | Attending: Oncology | Admitting: Hematology and Oncology

## 2018-07-01 ENCOUNTER — Inpatient Hospital Stay: Payer: Medicare Other

## 2018-07-01 VITALS — BP 127/61 | HR 73 | Temp 96.0°F | Resp 17

## 2018-07-01 VITALS — BP 145/71 | HR 76 | Temp 97.9°F | Resp 16 | Wt 213.5 lb

## 2018-07-01 DIAGNOSIS — G6181 Chronic inflammatory demyelinating polyneuritis: Secondary | ICD-10-CM | POA: Diagnosis not present

## 2018-07-01 DIAGNOSIS — N189 Chronic kidney disease, unspecified: Secondary | ICD-10-CM | POA: Diagnosis not present

## 2018-07-01 LAB — CBC WITH DIFFERENTIAL/PLATELET
Abs Immature Granulocytes: 0.03 10*3/uL (ref 0.00–0.07)
BASOS ABS: 0.1 10*3/uL (ref 0.0–0.1)
Basophils Relative: 1 %
EOS ABS: 0.4 10*3/uL (ref 0.0–0.5)
Eosinophils Relative: 4 %
HCT: 38.9 % — ABNORMAL LOW (ref 39.0–52.0)
Hemoglobin: 13.4 g/dL (ref 13.0–17.0)
Immature Granulocytes: 0 %
Lymphocytes Relative: 31 %
Lymphs Abs: 2.7 10*3/uL (ref 0.7–4.0)
MCH: 33.6 pg (ref 26.0–34.0)
MCHC: 34.4 g/dL (ref 30.0–36.0)
MCV: 97.5 fL (ref 80.0–100.0)
Monocytes Absolute: 0.7 10*3/uL (ref 0.1–1.0)
Monocytes Relative: 9 %
Neutro Abs: 4.8 10*3/uL (ref 1.7–7.7)
Neutrophils Relative %: 55 %
PLATELETS: 166 10*3/uL (ref 150–400)
RBC: 3.99 MIL/uL — ABNORMAL LOW (ref 4.22–5.81)
RDW: 12.4 % (ref 11.5–15.5)
WBC: 8.7 10*3/uL (ref 4.0–10.5)
nRBC: 0 % (ref 0.0–0.2)

## 2018-07-01 LAB — COMPREHENSIVE METABOLIC PANEL
ALT: 34 U/L (ref 0–44)
AST: 32 U/L (ref 15–41)
Albumin: 4.3 g/dL (ref 3.5–5.0)
Alkaline Phosphatase: 64 U/L (ref 38–126)
Anion gap: 14 (ref 5–15)
BUN: 28 mg/dL — ABNORMAL HIGH (ref 8–23)
CHLORIDE: 94 mmol/L — AB (ref 98–111)
CO2: 29 mmol/L (ref 22–32)
Calcium: 9 mg/dL (ref 8.9–10.3)
Creatinine, Ser: 1.57 mg/dL — ABNORMAL HIGH (ref 0.61–1.24)
GFR calc Af Amer: 52 mL/min — ABNORMAL LOW (ref >60)
GFR calc non Af Amer: 45 mL/min — ABNORMAL LOW (ref >60)
Glucose, Bld: 171 mg/dL — ABNORMAL HIGH (ref 70–99)
Potassium: 3.5 mmol/L (ref 3.5–5.1)
Sodium: 137 mmol/L (ref 135–145)
Total Bilirubin: 0.7 mg/dL (ref 0.3–1.2)
Total Protein: 8.3 g/dL — ABNORMAL HIGH (ref 6.5–8.1)

## 2018-07-01 MED ORDER — ACETAMINOPHEN 325 MG PO TABS
650.0000 mg | ORAL_TABLET | Freq: Once | ORAL | Status: AC
  Administered 2018-07-01: 650 mg via ORAL
  Filled 2018-07-01: qty 2

## 2018-07-01 MED ORDER — DIPHENHYDRAMINE HCL 25 MG PO CAPS
25.0000 mg | ORAL_CAPSULE | Freq: Once | ORAL | Status: AC
  Administered 2018-07-01: 25 mg via ORAL
  Filled 2018-07-01: qty 1

## 2018-07-01 MED ORDER — IMMUNE GLOBULIN (HUMAN) 20 GM/200ML IV SOLN
500.0000 mg/kg | INTRAVENOUS | Status: DC
  Administered 2018-07-01: 50 g via INTRAVENOUS
  Filled 2018-07-01: qty 100

## 2018-07-01 MED ORDER — SODIUM CHLORIDE 0.9% FLUSH
10.0000 mL | INTRAVENOUS | Status: DC | PRN
  Administered 2018-07-01: 10 mL via INTRAVENOUS
  Filled 2018-07-01: qty 10

## 2018-07-01 MED ORDER — DEXTROSE 5 % IV SOLN
Freq: Once | INTRAVENOUS | Status: AC
  Administered 2018-07-01: 09:00:00 via INTRAVENOUS
  Filled 2018-07-01: qty 250

## 2018-07-01 MED ORDER — HEPARIN SOD (PORK) LOCK FLUSH 100 UNIT/ML IV SOLN
500.0000 [IU] | Freq: Once | INTRAVENOUS | Status: AC
  Administered 2018-07-01: 500 [IU] via INTRAVENOUS
  Filled 2018-07-01: qty 5

## 2018-07-01 NOTE — Progress Notes (Signed)
Pt here for follow up. Denies any concerns at this time.  

## 2018-07-02 ENCOUNTER — Inpatient Hospital Stay: Payer: Medicare Other

## 2018-07-02 VITALS — BP 136/64 | HR 62 | Temp 96.0°F | Resp 16

## 2018-07-02 DIAGNOSIS — G6181 Chronic inflammatory demyelinating polyneuritis: Secondary | ICD-10-CM | POA: Diagnosis not present

## 2018-07-02 DIAGNOSIS — N189 Chronic kidney disease, unspecified: Secondary | ICD-10-CM | POA: Diagnosis not present

## 2018-07-02 MED ORDER — HEPARIN SOD (PORK) LOCK FLUSH 100 UNIT/ML IV SOLN
500.0000 [IU] | Freq: Once | INTRAVENOUS | Status: AC
  Administered 2018-07-02: 500 [IU] via INTRAVENOUS

## 2018-07-02 MED ORDER — DEXTROSE 5 % IV SOLN
Freq: Once | INTRAVENOUS | Status: AC
  Administered 2018-07-02: 09:00:00 via INTRAVENOUS
  Filled 2018-07-02: qty 250

## 2018-07-02 MED ORDER — SODIUM CHLORIDE 0.9% FLUSH
10.0000 mL | INTRAVENOUS | Status: DC | PRN
  Administered 2018-07-02: 10 mL via INTRAVENOUS
  Filled 2018-07-02: qty 10

## 2018-07-02 MED ORDER — IMMUNE GLOBULIN (HUMAN) 20 GM/200ML IV SOLN
50.0000 g | INTRAVENOUS | Status: DC
  Administered 2018-07-02: 50 g via INTRAVENOUS
  Filled 2018-07-02: qty 100

## 2018-07-02 MED ORDER — DIPHENHYDRAMINE HCL 25 MG PO CAPS
25.0000 mg | ORAL_CAPSULE | ORAL | Status: DC
  Administered 2018-07-02: 25 mg via ORAL

## 2018-07-02 MED ORDER — ACETAMINOPHEN 325 MG PO TABS
650.0000 mg | ORAL_TABLET | ORAL | Status: DC
  Administered 2018-07-02: 650 mg via ORAL

## 2018-07-03 ENCOUNTER — Inpatient Hospital Stay: Payer: Medicare Other

## 2018-07-03 VITALS — BP 158/69 | HR 76 | Temp 96.6°F | Resp 18

## 2018-07-03 DIAGNOSIS — G4733 Obstructive sleep apnea (adult) (pediatric): Secondary | ICD-10-CM | POA: Diagnosis not present

## 2018-07-03 DIAGNOSIS — G6181 Chronic inflammatory demyelinating polyneuritis: Secondary | ICD-10-CM

## 2018-07-03 DIAGNOSIS — N189 Chronic kidney disease, unspecified: Secondary | ICD-10-CM | POA: Diagnosis not present

## 2018-07-03 MED ORDER — HEPARIN SOD (PORK) LOCK FLUSH 100 UNIT/ML IV SOLN
INTRAVENOUS | Status: AC
  Filled 2018-07-03: qty 5

## 2018-07-03 MED ORDER — DIPHENHYDRAMINE HCL 25 MG PO CAPS
25.0000 mg | ORAL_CAPSULE | Freq: Once | ORAL | Status: AC
  Administered 2018-07-03: 25 mg via ORAL

## 2018-07-03 MED ORDER — ACETAMINOPHEN 325 MG PO TABS
650.0000 mg | ORAL_TABLET | Freq: Once | ORAL | Status: AC
  Administered 2018-07-03: 650 mg via ORAL

## 2018-07-03 MED ORDER — SODIUM CHLORIDE 0.9% FLUSH
10.0000 mL | Freq: Once | INTRAVENOUS | Status: AC | PRN
  Administered 2018-07-03: 10 mL
  Filled 2018-07-03: qty 10

## 2018-07-03 MED ORDER — HEPARIN SOD (PORK) LOCK FLUSH 100 UNIT/ML IV SOLN
500.0000 [IU] | Freq: Once | INTRAVENOUS | Status: AC | PRN
  Administered 2018-07-03: 500 [IU]

## 2018-07-03 MED ORDER — IMMUNE GLOBULIN (HUMAN) 20 GM/200ML IV SOLN
50.0000 g | Freq: Once | INTRAVENOUS | Status: AC
  Administered 2018-07-03: 50 g via INTRAVENOUS
  Filled 2018-07-03: qty 100

## 2018-07-03 MED ORDER — DEXTROSE 5 % IV SOLN
Freq: Once | INTRAVENOUS | Status: AC
  Administered 2018-07-03: 09:00:00 via INTRAVENOUS
  Filled 2018-07-03: qty 250

## 2018-07-03 NOTE — Patient Instructions (Signed)

## 2018-07-04 ENCOUNTER — Inpatient Hospital Stay: Payer: Medicare Other

## 2018-07-04 VITALS — BP 148/80 | HR 59 | Temp 95.7°F | Resp 18

## 2018-07-04 DIAGNOSIS — G6181 Chronic inflammatory demyelinating polyneuritis: Secondary | ICD-10-CM | POA: Diagnosis not present

## 2018-07-04 DIAGNOSIS — N189 Chronic kidney disease, unspecified: Secondary | ICD-10-CM | POA: Diagnosis not present

## 2018-07-04 MED ORDER — DIPHENHYDRAMINE HCL 25 MG PO CAPS
25.0000 mg | ORAL_CAPSULE | Freq: Once | ORAL | Status: AC
  Administered 2018-07-04: 25 mg via ORAL

## 2018-07-04 MED ORDER — IMMUNE GLOBULIN (HUMAN) 20 GM/200ML IV SOLN
50.0000 g | Freq: Once | INTRAVENOUS | Status: AC
  Administered 2018-07-04: 50 g via INTRAVENOUS
  Filled 2018-07-04: qty 100

## 2018-07-04 MED ORDER — ACETAMINOPHEN 325 MG PO TABS
ORAL_TABLET | ORAL | Status: AC
  Filled 2018-07-04: qty 2

## 2018-07-04 MED ORDER — SODIUM CHLORIDE 0.9% FLUSH
10.0000 mL | INTRAVENOUS | Status: DC | PRN
  Administered 2018-07-04: 10 mL via INTRAVENOUS
  Filled 2018-07-04: qty 10

## 2018-07-04 MED ORDER — DIPHENHYDRAMINE HCL 25 MG PO CAPS
ORAL_CAPSULE | ORAL | Status: AC
  Filled 2018-07-04: qty 1

## 2018-07-04 MED ORDER — ACETAMINOPHEN 325 MG PO TABS
650.0000 mg | ORAL_TABLET | Freq: Once | ORAL | Status: AC
  Administered 2018-07-04: 650 mg via ORAL

## 2018-07-04 MED ORDER — HEPARIN SOD (PORK) LOCK FLUSH 100 UNIT/ML IV SOLN
500.0000 [IU] | Freq: Once | INTRAVENOUS | Status: AC
  Administered 2018-07-04: 500 [IU] via INTRAVENOUS

## 2018-07-04 MED ORDER — DEXTROSE 5 % IV SOLN
Freq: Once | INTRAVENOUS | Status: AC
  Administered 2018-07-04: 09:00:00 via INTRAVENOUS
  Filled 2018-07-04: qty 250

## 2018-07-15 DIAGNOSIS — E1122 Type 2 diabetes mellitus with diabetic chronic kidney disease: Secondary | ICD-10-CM | POA: Diagnosis not present

## 2018-07-15 DIAGNOSIS — N183 Chronic kidney disease, stage 3 (moderate): Secondary | ICD-10-CM | POA: Diagnosis not present

## 2018-07-15 DIAGNOSIS — I10 Essential (primary) hypertension: Secondary | ICD-10-CM | POA: Diagnosis not present

## 2018-07-15 DIAGNOSIS — R809 Proteinuria, unspecified: Secondary | ICD-10-CM | POA: Diagnosis not present

## 2018-08-15 ENCOUNTER — Inpatient Hospital Stay: Payer: Medicare Other

## 2018-09-26 ENCOUNTER — Inpatient Hospital Stay: Payer: Medicare Other | Attending: Hematology and Oncology

## 2018-09-26 ENCOUNTER — Other Ambulatory Visit: Payer: Self-pay

## 2018-09-26 VITALS — BP 130/75 | HR 78 | Temp 97.0°F | Resp 20

## 2018-09-26 DIAGNOSIS — G6181 Chronic inflammatory demyelinating polyneuritis: Secondary | ICD-10-CM | POA: Insufficient documentation

## 2018-09-26 DIAGNOSIS — Z452 Encounter for adjustment and management of vascular access device: Secondary | ICD-10-CM | POA: Insufficient documentation

## 2018-09-26 DIAGNOSIS — Z95828 Presence of other vascular implants and grafts: Secondary | ICD-10-CM

## 2018-09-26 MED ORDER — HEPARIN SOD (PORK) LOCK FLUSH 100 UNIT/ML IV SOLN
500.0000 [IU] | Freq: Once | INTRAVENOUS | Status: AC
  Administered 2018-09-26: 500 [IU] via INTRAVENOUS

## 2018-09-26 MED ORDER — SODIUM CHLORIDE 0.9% FLUSH
10.0000 mL | INTRAVENOUS | Status: DC | PRN
  Administered 2018-09-26: 10 mL via INTRAVENOUS
  Filled 2018-09-26: qty 10

## 2018-10-01 DIAGNOSIS — H2513 Age-related nuclear cataract, bilateral: Secondary | ICD-10-CM | POA: Diagnosis not present

## 2018-10-11 DIAGNOSIS — Z1159 Encounter for screening for other viral diseases: Secondary | ICD-10-CM | POA: Diagnosis not present

## 2018-10-15 DIAGNOSIS — G6181 Chronic inflammatory demyelinating polyneuritis: Secondary | ICD-10-CM | POA: Diagnosis not present

## 2018-10-15 DIAGNOSIS — E7800 Pure hypercholesterolemia, unspecified: Secondary | ICD-10-CM | POA: Diagnosis not present

## 2018-10-15 DIAGNOSIS — Z79899 Other long term (current) drug therapy: Secondary | ICD-10-CM | POA: Diagnosis not present

## 2018-10-15 DIAGNOSIS — E785 Hyperlipidemia, unspecified: Secondary | ICD-10-CM | POA: Diagnosis not present

## 2018-10-15 DIAGNOSIS — E1136 Type 2 diabetes mellitus with diabetic cataract: Secondary | ICD-10-CM | POA: Diagnosis not present

## 2018-10-15 DIAGNOSIS — Z01812 Encounter for preprocedural laboratory examination: Secondary | ICD-10-CM | POA: Diagnosis not present

## 2018-10-15 DIAGNOSIS — Z794 Long term (current) use of insulin: Secondary | ICD-10-CM | POA: Diagnosis not present

## 2018-10-15 DIAGNOSIS — H2513 Age-related nuclear cataract, bilateral: Secondary | ICD-10-CM | POA: Diagnosis not present

## 2018-10-15 DIAGNOSIS — I10 Essential (primary) hypertension: Secondary | ICD-10-CM | POA: Diagnosis not present

## 2018-10-15 DIAGNOSIS — H52202 Unspecified astigmatism, left eye: Secondary | ICD-10-CM | POA: Diagnosis not present

## 2018-10-15 DIAGNOSIS — H2512 Age-related nuclear cataract, left eye: Secondary | ICD-10-CM | POA: Diagnosis not present

## 2018-10-20 ENCOUNTER — Other Ambulatory Visit: Payer: Self-pay | Admitting: Family Medicine

## 2018-10-20 ENCOUNTER — Encounter: Payer: Self-pay | Admitting: Hematology and Oncology

## 2018-10-20 DIAGNOSIS — I10 Essential (primary) hypertension: Secondary | ICD-10-CM

## 2018-10-22 ENCOUNTER — Other Ambulatory Visit: Payer: Self-pay | Admitting: Family Medicine

## 2018-10-22 DIAGNOSIS — I10 Essential (primary) hypertension: Secondary | ICD-10-CM

## 2018-10-25 DIAGNOSIS — Z1159 Encounter for screening for other viral diseases: Secondary | ICD-10-CM | POA: Diagnosis not present

## 2018-10-25 DIAGNOSIS — E1142 Type 2 diabetes mellitus with diabetic polyneuropathy: Secondary | ICD-10-CM | POA: Diagnosis not present

## 2018-10-25 DIAGNOSIS — E1169 Type 2 diabetes mellitus with other specified complication: Secondary | ICD-10-CM | POA: Diagnosis not present

## 2018-10-25 DIAGNOSIS — I10 Essential (primary) hypertension: Secondary | ICD-10-CM | POA: Diagnosis not present

## 2018-10-25 DIAGNOSIS — Z794 Long term (current) use of insulin: Secondary | ICD-10-CM | POA: Diagnosis not present

## 2018-10-25 DIAGNOSIS — E785 Hyperlipidemia, unspecified: Secondary | ICD-10-CM | POA: Diagnosis not present

## 2018-10-25 DIAGNOSIS — E1159 Type 2 diabetes mellitus with other circulatory complications: Secondary | ICD-10-CM | POA: Diagnosis not present

## 2018-10-29 DIAGNOSIS — Z79899 Other long term (current) drug therapy: Secondary | ICD-10-CM | POA: Diagnosis not present

## 2018-10-29 DIAGNOSIS — E1336 Other specified diabetes mellitus with diabetic cataract: Secondary | ICD-10-CM | POA: Diagnosis not present

## 2018-10-29 DIAGNOSIS — H52201 Unspecified astigmatism, right eye: Secondary | ICD-10-CM | POA: Diagnosis not present

## 2018-10-29 DIAGNOSIS — E7800 Pure hypercholesterolemia, unspecified: Secondary | ICD-10-CM | POA: Diagnosis not present

## 2018-10-29 DIAGNOSIS — E1136 Type 2 diabetes mellitus with diabetic cataract: Secondary | ICD-10-CM | POA: Diagnosis not present

## 2018-10-29 DIAGNOSIS — Z01812 Encounter for preprocedural laboratory examination: Secondary | ICD-10-CM | POA: Diagnosis not present

## 2018-10-29 DIAGNOSIS — Z880 Allergy status to penicillin: Secondary | ICD-10-CM | POA: Diagnosis not present

## 2018-10-29 DIAGNOSIS — H2511 Age-related nuclear cataract, right eye: Secondary | ICD-10-CM | POA: Diagnosis not present

## 2018-10-29 DIAGNOSIS — Z794 Long term (current) use of insulin: Secondary | ICD-10-CM | POA: Diagnosis not present

## 2018-10-29 DIAGNOSIS — I10 Essential (primary) hypertension: Secondary | ICD-10-CM | POA: Diagnosis not present

## 2018-10-29 DIAGNOSIS — Z87891 Personal history of nicotine dependence: Secondary | ICD-10-CM | POA: Diagnosis not present

## 2018-11-18 DIAGNOSIS — I10 Essential (primary) hypertension: Secondary | ICD-10-CM | POA: Diagnosis not present

## 2018-11-18 DIAGNOSIS — N183 Chronic kidney disease, stage 3 (moderate): Secondary | ICD-10-CM | POA: Diagnosis not present

## 2018-11-18 DIAGNOSIS — R809 Proteinuria, unspecified: Secondary | ICD-10-CM | POA: Diagnosis not present

## 2018-11-18 DIAGNOSIS — E1122 Type 2 diabetes mellitus with diabetic chronic kidney disease: Secondary | ICD-10-CM | POA: Diagnosis not present

## 2018-11-19 ENCOUNTER — Other Ambulatory Visit: Payer: Self-pay | Admitting: Family Medicine

## 2018-11-19 DIAGNOSIS — E782 Mixed hyperlipidemia: Secondary | ICD-10-CM

## 2018-11-20 ENCOUNTER — Other Ambulatory Visit: Payer: Self-pay | Admitting: Family Medicine

## 2018-11-20 DIAGNOSIS — I10 Essential (primary) hypertension: Secondary | ICD-10-CM

## 2018-11-20 MED ORDER — LOSARTAN POTASSIUM-HCTZ 100-25 MG PO TABS: 1.0000 | ORAL_TABLET | Freq: Every day | ORAL | 0 refills | Status: DC

## 2018-11-22 NOTE — Progress Notes (Signed)
[facility]  [address] [place], [place] [address] Phone: [phone]  Fax: [phone]   Clinic Day:  11/25/2018  Referring physician: Juline Patch, MD  Chief Complaint: Phillip Fitzgerald is a 68 y.o. male with chronic inflammatary demyelinating polyneuropathy (CIDP) who is seen for 5 month assessment and continuation of IVIG.  HPI: The patient was last seen in the medical oncology clinic on 07/01/2018. At that time, he felt pretty good.  Exam revealed atrophy and weakness in intrinsic muscles of right hand. Creatinine was 1.57.  He received IVIG x4 days.   During the interim, he has been doing "fine." He continues to have atrophy in his right hand and left leg. He reports difficulty opening jars and turning a key. He has had mild improvement in the past with OT.   He recently saw Dr. Holley Raring, and notes his CKD is stable. He is followed in neurology by Dr. Manuella Ghazi, who he sees annually.    Past Medical History:  Diagnosis Date   CIDP (chronic inflammatory demyelinating polyneuropathy) (HCC)    left leg weakness   CIDP (chronic inflammatory demyelinating polyneuropathy) (HCC)    Diabetes mellitus without complication (HCC)    Elevated lipids    Hypertension    Sleep apnea    CPAP    Past Surgical History:  Procedure Laterality Date   APPENDECTOMY     COLONOSCOPY  2011   cleared for 5 yrs- Dr Allen Norris   COLONOSCOPY WITH PROPOFOL N/A 10/15/2017   Procedure: COLONOSCOPY WITH PROPOFOL;  Surgeon: [name], MD;  Location: [facility];  Service: Endoscopy;  Laterality: N/A;  Diabetic and sleep apnea   KNEE SURGERY Right    scope   POLYPECTOMY  10/15/2017   Procedure: POLYPECTOMY;  Surgeon: [name], MD;  Location: [facility];  Service: Endoscopy;;   PORTACATH PLACEMENT Right 09/16/2015   Procedure: INSERTION PORT-A-CATH;  Surgeon: Leonie Green, MD;  Location: [facility];  Service: General;  Laterality: Right;    TONSILLECTOMY      Family History  Problem Relation Age of Onset   Diabetes Father    Heart disease Father    Angina Mother    Macular degeneration Mother    Lung cancer Sister     Social History:  reports that he quit smoking about 40 years ago. He has never used smokeless tobacco. He reports current alcohol use of about 3.0 standard drinks of alcohol per week. He reports that he does not use drugs. He reports that he does not use drugs.  He lives in [place], [place].  The patient is alone today.  Allergies:  Allergies  Allergen Reactions   Penicillins Other (See Comments)    Unknown reaction from chilhood    Current Medications: Current Outpatient Medications  Medication Sig Dispense Refill   amLODipine (NORVASC) 10 MG tablet TAKE 1 TABLET BY MOUTH ONCE DAILY. NEED APPOINTMENT FOR FURTHER REFILLS 90 tablet 1   carvedilol (COREG) 12.5 MG tablet TAKE 1 TABLET BY MOUTH TWICE A DAY 180 tablet 0   Cetirizine HCl (ZYRTEC ALLERGY) 10 MG CAPS Take 1 capsule by mouth daily.      ciprofloxacin (CILOXAN) 0.3 % ophthalmic solution Place ONE drop IN THE RIGHT EYE THREE TIMES DAILY STARTING THREE DAYS prior TO surgery AND AFTER surgery FOUR TIMES DAILY FOR ONE WEEK, THEN     Continuous Blood Gluc Receiver (FREESTYLE LIBRE 14 DAY READER) DEVI Use 1 each as needed .  Use  to test blood sugar     Continuous Blood Gluc Sensor (FREESTYLE LIBRE 14 DAY SENSOR) MISC Use 1 each every 14 (fourteen) days     Dulaglutide (TRULICITY) 0.76 KG/8.8PJ SOPN Inject into the skin once a week. Dr Honor Junes     hydrALAZINE (APRESOLINE) 10 MG tablet Take 10 mg by mouth daily.      Immune Globulin 10% (GAMUNEX-C) 10 GM/100ML SOLN Inject into the vein every 4 (four) months. 50 grams- Dr Manuella Ghazi now every 5 months     Insulin Glargine (LANTUS SOLOSTAR) 100 UNIT/ML Solostar Pen INJECT 52 UNITS UNDER THE SKIN AT BEDTIME- Dr Maretta Bees 15 mL 6   Insulin Lispro (HUMALOG PEN [place]) Inject into the skin as directed.  Sliding Scale: 7-14 Untis     Insulin Pen Needle (FIFTY50 PEN NEEDLES) 31G X 8 MM MISC Use 4 (four) times daily. DX: E11.8 100 each 6   losartan-hydrochlorothiazide (HYZAAR) 100-25 MG tablet Take 1 tablet by mouth daily. 30 tablet 0   metFORMIN (GLUCOPHAGE) 500 MG tablet Take 1 tablet (500 mg total) by mouth 2 (two) times daily. (Patient taking differently: Take 500 mg by mouth 2 (two) times daily. Dr Honor Junes) 60 tablet 6   PROLENSA 0.07 % SOLN INSTILL ONE DROP IN THE RIGHT EYE EVERY DAY AS DIRECTED BEGIN THREE DAYS PRIOR TO SURGERY AND CONTINUE AFTER SURGERY UNTIL GONE     rosuvastatin (CRESTOR) 20 MG tablet TAKE 1 TABLET BY MOUTH EVERY DAY 90 tablet 0   venlafaxine (EFFEXOR) 100 MG tablet Take 1 tablet (100 mg total) by mouth 2 (two) times daily. 180 tablet 1   No current facility-administered medications for this visit.    Facility-Administered Medications Ordered in Other Visits  Medication Dose Route Frequency Provider Last Rate Last Dose   heparin lock flush 100 unit/mL  500 Units Intravenous Once Myson Levi C, MD       sodium chloride flush (NS) 0.9 % injection 10 mL  10 mL Intravenous PRN Lequita Asal, MD   10 mL at 11/25/18 0901    Review of Systems  Constitutional: Negative.  Negative for chills, diaphoresis, fever, malaise/fatigue and weight loss.       Feels "fine."  HENT: Negative.  Negative for congestion, hearing loss, sinus pain and sore throat.   Eyes: Negative.  Negative for blurred vision.  Respiratory: Negative.  Negative for cough, sputum production and wheezing.   Cardiovascular: Negative.  Negative for chest pain, palpitations, orthopnea, leg swelling and PND.  Gastrointestinal: Negative.  Negative for abdominal pain, blood in stool, constipation, diarrhea, melena, nausea and vomiting.  Genitourinary: Negative.  Negative for dysuria, frequency, hematuria and urgency.  Musculoskeletal: Negative.  Negative for back pain, joint pain and myalgias.    Skin: Negative.  Negative for rash.  Neurological: Positive for sensory change (atrophy in right hand and left leg). Negative for dizziness, tingling, weakness and headaches.  Endo/Heme/Allergies: Negative.  Does not bruise/bleed easily.       Diabetes.  Psychiatric/Behavioral: Negative.  Negative for depression, memory loss and substance abuse. The patient is not nervous/anxious and does not have insomnia.   All other systems reviewed and are negative.  Performance status (ECOG): 1  Vitals Blood pressure 139/78, pulse 77, temperature (!) 97.3 F (36.3 C), temperature source Tympanic, resp. rate 16, weight 210 lb 8.6 oz (95.5 kg), SpO2 99 %.   Physical Exam  Constitutional: He is oriented to person, place, and time. He appears well-developed and well-nourished. No distress.  HENT:  Head: Normocephalic and atraumatic.  Right Ear: External ear normal.  Mouth/Throat: No oropharyngeal exudate.  Graying hair. Mask.  Eyes: Pupils are equal, round, and reactive to light. Conjunctivae and EOM are normal. No scleral icterus.  Glasses.  Blue eyes.   Neck: Normal range of motion. Neck supple. No JVD present.  Cardiovascular: Normal rate, regular rhythm and normal heart sounds. Exam reveals no gallop.  No murmur heard. Pulmonary/Chest: Effort normal and breath sounds normal. No respiratory distress. He has no wheezes. He has no rales.  Abdominal: Soft. Bowel sounds are normal. He exhibits no distension. There is no abdominal tenderness. There is no rebound and no guarding.  Musculoskeletal: Normal range of motion.        General: No edema.  Lymphadenopathy:    He has no cervical adenopathy.    He has no axillary adenopathy.       Right: No supraclavicular adenopathy present.       Left: No supraclavicular adenopathy present.  Neurological: He is alert and oriented to person, place, and time. He displays atrophy (right hand, left leg).  Skin: Skin is warm and dry. No rash noted. He is not  diaphoretic. No erythema. No pallor.  Psychiatric: He has a normal mood and affect. His behavior is normal. Judgment and thought content normal.  Nursing note and vitals reviewed.   Infusion on 11/25/2018  Component Date Value Ref Range Status   Sodium 11/25/2018 139  135 - 145 mmol/L Final   Potassium 11/25/2018 3.7  3.5 - 5.1 mmol/L Final   Chloride 11/25/2018 100  98 - 111 mmol/L Final   CO2 11/25/2018 26  22 - 32 mmol/L Final   Glucose, Bld 11/25/2018 128* 70 - 99 mg/dL Final   BUN 11/25/2018 25* 8 - 23 mg/dL Final   Creatinine, Ser 11/25/2018 1.56* 0.61 - 1.24 mg/dL Final   Calcium 11/25/2018 8.9  8.9 - 10.3 mg/dL Final   Total Protein 11/25/2018 8.2* 6.5 - 8.1 g/dL Final   Albumin 11/25/2018 4.3  3.5 - 5.0 g/dL Final   AST 11/25/2018 27  15 - 41 U/L Final   ALT 11/25/2018 22  0 - 44 U/L Final   Alkaline Phosphatase 11/25/2018 60  38 - 126 U/L Final   Total Bilirubin 11/25/2018 0.8  0.3 - 1.2 mg/dL Final   GFR calc non Af Amer 11/25/2018 45* >60 mL/min Final   GFR calc Af Amer 11/25/2018 52* >60 mL/min Final   Anion gap 11/25/2018 13  5 - 15 Final   Performed at [facility], [address] [place], [place] [address]   WBC 11/25/2018 9.1  4.0 - 10.5 K/uL Final   RBC 11/25/2018 3.77* 4.22 - 5.81 MIL/uL Final   Hemoglobin 11/25/2018 12.9* 13.0 - 17.0 g/dL Final   HCT 11/25/2018 37.3* 39.0 - 52.0 % Final   MCV 11/25/2018 98.9  80.0 - 100.0 fL Final   MCH 11/25/2018 34.2* 26.0 - 34.0 pg Final   MCHC 11/25/2018 34.6  30.0 - 36.0 g/dL Final   RDW 11/25/2018 12.5  11.5 - 15.5 % Final   Platelets 11/25/2018 158  150 - 400 K/uL Final   nRBC 11/25/2018 0.0  0.0 - 0.2 % Final   Neutrophils Relative % 11/25/2018 57  % Final   Neutro Abs 11/25/2018 5.2  1.7 - 7.7 K/uL Final   Lymphocytes Relative 11/25/2018 31  % Final   Lymphs Abs 11/25/2018 2.8  0.7 - 4.0 K/uL Final   Monocytes Relative  11/25/2018 9  % Final   Monocytes Absolute  11/25/2018 0.8  0.1 - 1.0 K/uL Final   Eosinophils Relative 11/25/2018 3  % Final   Eosinophils Absolute 11/25/2018 0.3  0.0 - 0.5 K/uL Final   Basophils Relative 11/25/2018 0  % Final   Basophils Absolute 11/25/2018 0.0  0.0 - 0.1 K/uL Final   Immature Granulocytes 11/25/2018 0  % Final   Abs Immature Granulocytes 11/25/2018 0.03  0.00 - 0.07 K/uL Final   Performed at [facility], [address], [place], [place] [address]    Assessment:  JONI NORROD is a 68 y.o. male with chronic inflammatary demyelinating polyneuropathy (CIDP).  He was diagnosed in 2012.    He began IVIG on 08/18/2010.  He receives maintenance IVIG 2 gm/kg over 4 days every 5 months (last 07/01/2018).   He has chronic kidney disease.  Creatinine was 1.61 on 01/31/2018.  Renal ultrasound on 03/18/2018 was normal.   Symptomatically, he is doing well.  He has atrophy in his right hand and left leg.  Creatinine 1.56.  Plan: 1.   Labs today:  CBC with diff, CMP. 2.   Chronic inflammatary demyelinating polyneuropathy (CIDP)             Patient receives maintenance IVIG every 5 months.  Continue IVIG 50 g daily x4 days.  Premedications Tylenol 650 mg po and Benadryl 25 mg po. 3.   IVIG daily x 4 beginning today. 4.   RTC in 5 months for MD assessment, labs (CBC with diff, CMP), and IVIG daily x 4.  I discussed the assessment and treatment plan with the patient.  The patient was provided an opportunity to ask questions and all were answered.  The patient agreed with the plan and demonstrated an understanding of the instructions.  The patient was advised to call back if the symptoms worsen or if the condition fails to improve as anticipated.   Lequita Asal, MD, PhD    11/25/2018, 9:41 AM  I, Cloyde Reams Dorshimer, am acting as Education administrator for [employer]. Mike Gip, MD, PhD.  I, Shelli Portilla C. Mike Gip, MD, have reviewed the above documentation for accuracy and completeness, and I agree with the above.

## 2018-11-25 ENCOUNTER — Other Ambulatory Visit: Payer: Self-pay

## 2018-11-25 ENCOUNTER — Inpatient Hospital Stay: Payer: Medicare Other

## 2018-11-25 ENCOUNTER — Encounter: Payer: Self-pay | Admitting: Hematology and Oncology

## 2018-11-25 ENCOUNTER — Inpatient Hospital Stay: Payer: Medicare Other | Attending: Hematology and Oncology | Admitting: Hematology and Oncology

## 2018-11-25 VITALS — BP 145/76 | HR 69 | Temp 96.8°F | Resp 18

## 2018-11-25 VITALS — BP 139/78 | HR 77 | Temp 97.3°F | Resp 16 | Wt 210.5 lb

## 2018-11-25 DIAGNOSIS — I129 Hypertensive chronic kidney disease with stage 1 through stage 4 chronic kidney disease, or unspecified chronic kidney disease: Secondary | ICD-10-CM | POA: Diagnosis not present

## 2018-11-25 DIAGNOSIS — E1122 Type 2 diabetes mellitus with diabetic chronic kidney disease: Secondary | ICD-10-CM | POA: Diagnosis not present

## 2018-11-25 DIAGNOSIS — G6181 Chronic inflammatory demyelinating polyneuritis: Secondary | ICD-10-CM

## 2018-11-25 DIAGNOSIS — Z79899 Other long term (current) drug therapy: Secondary | ICD-10-CM | POA: Insufficient documentation

## 2018-11-25 DIAGNOSIS — N189 Chronic kidney disease, unspecified: Secondary | ICD-10-CM | POA: Insufficient documentation

## 2018-11-25 DIAGNOSIS — Z87891 Personal history of nicotine dependence: Secondary | ICD-10-CM

## 2018-11-25 LAB — CBC WITH DIFFERENTIAL/PLATELET
Abs Immature Granulocytes: 0.03 10*3/uL (ref 0.00–0.07)
Basophils Absolute: 0 10*3/uL (ref 0.0–0.1)
Basophils Relative: 0 %
Eosinophils Absolute: 0.3 10*3/uL (ref 0.0–0.5)
Eosinophils Relative: 3 %
HCT: 37.3 % — ABNORMAL LOW (ref 39.0–52.0)
Hemoglobin: 12.9 g/dL — ABNORMAL LOW (ref 13.0–17.0)
Immature Granulocytes: 0 %
Lymphocytes Relative: 31 %
Lymphs Abs: 2.8 10*3/uL (ref 0.7–4.0)
MCH: 34.2 pg — ABNORMAL HIGH (ref 26.0–34.0)
MCHC: 34.6 g/dL (ref 30.0–36.0)
MCV: 98.9 fL (ref 80.0–100.0)
Monocytes Absolute: 0.8 10*3/uL (ref 0.1–1.0)
Monocytes Relative: 9 %
Neutro Abs: 5.2 10*3/uL (ref 1.7–7.7)
Neutrophils Relative %: 57 %
Platelets: 158 10*3/uL (ref 150–400)
RBC: 3.77 MIL/uL — ABNORMAL LOW (ref 4.22–5.81)
RDW: 12.5 % (ref 11.5–15.5)
WBC: 9.1 10*3/uL (ref 4.0–10.5)
nRBC: 0 % (ref 0.0–0.2)

## 2018-11-25 LAB — COMPREHENSIVE METABOLIC PANEL
ALT: 22 U/L (ref 0–44)
AST: 27 U/L (ref 15–41)
Albumin: 4.3 g/dL (ref 3.5–5.0)
Alkaline Phosphatase: 60 U/L (ref 38–126)
Anion gap: 13 (ref 5–15)
BUN: 25 mg/dL — ABNORMAL HIGH (ref 8–23)
CO2: 26 mmol/L (ref 22–32)
Calcium: 8.9 mg/dL (ref 8.9–10.3)
Chloride: 100 mmol/L (ref 98–111)
Creatinine, Ser: 1.56 mg/dL — ABNORMAL HIGH (ref 0.61–1.24)
GFR calc Af Amer: 52 mL/min — ABNORMAL LOW (ref >60)
GFR calc non Af Amer: 45 mL/min — ABNORMAL LOW (ref >60)
Glucose, Bld: 128 mg/dL — ABNORMAL HIGH (ref 70–99)
Potassium: 3.7 mmol/L (ref 3.5–5.1)
Sodium: 139 mmol/L (ref 135–145)
Total Bilirubin: 0.8 mg/dL (ref 0.3–1.2)
Total Protein: 8.2 g/dL — ABNORMAL HIGH (ref 6.5–8.1)

## 2018-11-25 MED ORDER — ACETAMINOPHEN 325 MG PO TABS
650.0000 mg | ORAL_TABLET | Freq: Once | ORAL | Status: AC
  Administered 2018-11-25: 10:00:00 650 mg via ORAL
  Filled 2018-11-25: qty 2

## 2018-11-25 MED ORDER — HEPARIN SOD (PORK) LOCK FLUSH 100 UNIT/ML IV SOLN
500.0000 [IU] | Freq: Once | INTRAVENOUS | Status: AC
  Administered 2018-11-25: 500 [IU] via INTRAVENOUS

## 2018-11-25 MED ORDER — SODIUM CHLORIDE 0.9% FLUSH
10.0000 mL | INTRAVENOUS | Status: DC | PRN
  Administered 2018-11-25: 10 mL via INTRAVENOUS
  Filled 2018-11-25: qty 10

## 2018-11-25 MED ORDER — DEXTROSE 5 % IV SOLN
Freq: Once | INTRAVENOUS | Status: AC
  Administered 2018-11-25: 10:00:00 via INTRAVENOUS
  Filled 2018-11-25: qty 250

## 2018-11-25 MED ORDER — DIPHENHYDRAMINE HCL 25 MG PO TABS
25.0000 mg | ORAL_TABLET | Freq: Once | ORAL | Status: AC
  Administered 2018-11-25: 25 mg via ORAL
  Filled 2018-11-25: qty 1

## 2018-11-25 MED ORDER — IMMUNE GLOBULIN (HUMAN) 20 GM/200ML IV SOLN
500.0000 mg/kg | INTRAVENOUS | Status: DC
  Administered 2018-11-25: 50 g via INTRAVENOUS
  Filled 2018-11-25: qty 100

## 2018-11-25 NOTE — Progress Notes (Signed)
Patient here for follow up. Denies any concerns.  

## 2018-11-25 NOTE — Patient Instructions (Signed)
Immune Globulin Injection What is this medicine? IMMUNE GLOBULIN (im MUNE GLOB yoo lin) helps to prevent or reduce the severity of certain infections in patients who are at risk. This medicine is collected from the pooled blood of many donors. It is used to treat immune system problems, thrombocytopenia, and Kawasaki syndrome. This medicine may be used for other purposes; ask your health care provider or pharmacist if you have questions. COMMON BRAND NAME(S): ASCENIV, Baygam, BIVIGAM, Carimune, Carimune NF, cutaquig, Cuvitru, Flebogamma, Flebogamma DIF, GamaSTAN, GamaSTAN S/D, Gamimune N, Gammagard, Gammagard S/D, Gammaked, Gammaplex, Gammar-P IV, Gamunex, Gamunex-C, Hizentra, Iveegam, Iveegam EN, Octagam, Panglobulin, Panglobulin NF, panzyga, Polygam S/D, Privigen, Sandoglobulin, Venoglobulin-S, Vigam, Vivaglobulin, Xembify What should I tell my health care provider before I take this medicine? They need to know if you have any of these conditions:   diabetes   extremely low or no immune antibodies in the blood   heart disease   history of blood clots   hyperprolinemia   infection in the blood, sepsis   kidney disease   taking medicine that may change kidney function - ask your health care provider about your medicine   an unusual or allergic reaction to human immune globulin, albumin, maltose, sucrose, polysorbate 80, other medicines, foods, dyes, or preservatives   pregnant or trying to get pregnant   breast-feeding How should I use this medicine? This medicine is for injection into a muscle or infusion into a vein or skin. It is usually given by a health care professional in a hospital or clinic setting. In rare cases, some brands of this medicine might be given at home. You will be taught how to give this medicine. Use exactly as directed. Take your medicine at regular intervals. Do not take your medicine more often than directed. Talk to your pediatrician regarding  the use of this medicine in children. Special care may be needed. Overdosage: If you think you have taken too much of this medicine contact a poison control center or emergency room at once. NOTE: This medicine is only for you. Do not share this medicine with others. What if I miss a dose? It is important not to miss your dose. Call your doctor or health care professional if you are unable to keep an appointment. If you give yourself the medicine and you miss a dose, take it as soon as you can. If it is almost time for your next dose, take only that dose. Do not take double or extra doses. What may interact with this medicine?  aspirin and aspirin-like medicines  cisplatin  cyclosporine  medicines for infection like acyclovir, adefovir, amphotericin B, bacitracin, cidofovir, foscarnet, ganciclovir, gentamicin, pentamidine, vancomycin  NSAIDS, medicines for pain and inflammation, like ibuprofen or naproxen  pamidronate  vaccines  zoledronic acid This list may not describe all possible interactions. Give your health care provider a list of all the medicines, herbs, non-prescription drugs, or dietary supplements you use. Also tell them if you smoke, drink alcohol, or use illegal drugs. Some items may interact with your medicine. What should I watch for while using this medicine? Your condition will be monitored carefully while you are receiving this medicine. This medicine is made from pooled blood donations of many different people. It may be possible to pass an infection in this medicine. However, the donors are screened for infections and all products are tested for HIV and hepatitis. The medicine is treated to kill most or all bacteria and viruses. Talk to your doctor about   the risks and benefits of this medicine. Do not have vaccinations for at least 14 days before, or until at least 3 months after receiving this medicine. What side effects may I notice from receiving this  medicine? Side effects that you should report to your doctor or health care professional as soon as possible:  allergic reactions like skin rash, itching or hives, swelling of the face, lips, or tongue  breathing problems  chest pain or tightness  fever, chills  headache with nausea, vomiting  neck pain or difficulty moving neck  pain when moving eyes  pain, swelling, warmth in the leg  problems with balance, talking, walking  sudden weight gain  swelling of the ankles, feet, hands  trouble passing urine or change in the amount of urine Side effects that usually do not require medical attention (report to your doctor or health care professional if they continue or are bothersome):  dizzy, drowsy  flushing  increased sweating  leg cramps  muscle aches and pains  pain at site where injected This list may not describe all possible side effects. Call your doctor for medical advice about side effects. You may report side effects to FDA at 1-800-FDA-1088. Where should I keep my medicine? Keep out of the reach of children. This drug is usually given in a hospital or clinic and will not be stored at home. In rare cases, some brands of this medicine may be given at home. If you are using this medicine at home, you will be instructed on how to store this medicine. Throw away any unused medicine after the expiration date on the label. NOTE: This sheet is a summary. It may not cover all possible information. If you have questions about this medicine, talk to your doctor, pharmacist, or health care provider.  2020 Elsevier/Gold Standard (2008-07-15 11:44:49)  

## 2018-11-26 ENCOUNTER — Inpatient Hospital Stay: Payer: Medicare Other

## 2018-11-26 VITALS — BP 162/81 | HR 66 | Temp 96.0°F | Resp 18

## 2018-11-26 DIAGNOSIS — E1122 Type 2 diabetes mellitus with diabetic chronic kidney disease: Secondary | ICD-10-CM | POA: Diagnosis not present

## 2018-11-26 DIAGNOSIS — G6181 Chronic inflammatory demyelinating polyneuritis: Secondary | ICD-10-CM

## 2018-11-26 DIAGNOSIS — Z95828 Presence of other vascular implants and grafts: Secondary | ICD-10-CM

## 2018-11-26 DIAGNOSIS — I129 Hypertensive chronic kidney disease with stage 1 through stage 4 chronic kidney disease, or unspecified chronic kidney disease: Secondary | ICD-10-CM | POA: Diagnosis not present

## 2018-11-26 DIAGNOSIS — Z87891 Personal history of nicotine dependence: Secondary | ICD-10-CM | POA: Diagnosis not present

## 2018-11-26 DIAGNOSIS — N183 Chronic kidney disease, stage 3 (moderate): Secondary | ICD-10-CM | POA: Diagnosis not present

## 2018-11-26 DIAGNOSIS — N189 Chronic kidney disease, unspecified: Secondary | ICD-10-CM | POA: Diagnosis not present

## 2018-11-26 DIAGNOSIS — I10 Essential (primary) hypertension: Secondary | ICD-10-CM | POA: Diagnosis not present

## 2018-11-26 DIAGNOSIS — Z79899 Other long term (current) drug therapy: Secondary | ICD-10-CM | POA: Diagnosis not present

## 2018-11-26 DIAGNOSIS — R809 Proteinuria, unspecified: Secondary | ICD-10-CM | POA: Diagnosis not present

## 2018-11-26 MED ORDER — SODIUM CHLORIDE 0.9 % IV SOLN
Freq: Once | INTRAVENOUS | Status: DC
  Filled 2018-11-26: qty 250

## 2018-11-26 MED ORDER — DEXTROSE 5 % IV SOLN
Freq: Once | INTRAVENOUS | Status: AC
  Administered 2018-11-26: 10:00:00 via INTRAVENOUS
  Filled 2018-11-26: qty 250

## 2018-11-26 MED ORDER — IMMUNE GLOBULIN (HUMAN) 20 GM/200ML IV SOLN
50.0000 g | Freq: Once | INTRAVENOUS | Status: AC
  Administered 2018-11-26: 10:00:00 50 g via INTRAVENOUS
  Filled 2018-11-26: qty 100

## 2018-11-26 MED ORDER — DIPHENHYDRAMINE HCL 25 MG PO CAPS
25.0000 mg | ORAL_CAPSULE | Freq: Once | ORAL | Status: AC
  Administered 2018-11-26: 25 mg via ORAL

## 2018-11-26 MED ORDER — ACETAMINOPHEN 325 MG PO TABS
650.0000 mg | ORAL_TABLET | Freq: Once | ORAL | Status: AC
  Administered 2018-11-26: 10:00:00 650 mg via ORAL

## 2018-11-26 NOTE — Addendum Note (Signed)
Addended by: Charlyn Minerva on: 11/26/2018 01:55 PM   Modules accepted: Orders

## 2018-11-27 ENCOUNTER — Inpatient Hospital Stay: Payer: Medicare Other

## 2018-11-27 ENCOUNTER — Other Ambulatory Visit: Payer: Self-pay

## 2018-11-27 VITALS — BP 125/69 | HR 69 | Temp 98.1°F | Resp 18

## 2018-11-27 DIAGNOSIS — N189 Chronic kidney disease, unspecified: Secondary | ICD-10-CM | POA: Diagnosis not present

## 2018-11-27 DIAGNOSIS — E1122 Type 2 diabetes mellitus with diabetic chronic kidney disease: Secondary | ICD-10-CM | POA: Diagnosis not present

## 2018-11-27 DIAGNOSIS — Z79899 Other long term (current) drug therapy: Secondary | ICD-10-CM | POA: Diagnosis not present

## 2018-11-27 DIAGNOSIS — G6181 Chronic inflammatory demyelinating polyneuritis: Secondary | ICD-10-CM

## 2018-11-27 DIAGNOSIS — Z87891 Personal history of nicotine dependence: Secondary | ICD-10-CM | POA: Diagnosis not present

## 2018-11-27 DIAGNOSIS — I129 Hypertensive chronic kidney disease with stage 1 through stage 4 chronic kidney disease, or unspecified chronic kidney disease: Secondary | ICD-10-CM | POA: Diagnosis not present

## 2018-11-27 MED ORDER — SODIUM CHLORIDE 0.9% FLUSH
10.0000 mL | INTRAVENOUS | Status: DC | PRN
  Administered 2018-11-27: 12:00:00 10 mL via INTRAVENOUS
  Filled 2018-11-27: qty 10

## 2018-11-27 MED ORDER — IMMUNE GLOBULIN (HUMAN) 20 GM/200ML IV SOLN
50.0000 g | Freq: Once | INTRAVENOUS | Status: AC
  Administered 2018-11-27: 50 g via INTRAVENOUS
  Filled 2018-11-27: qty 100

## 2018-11-27 MED ORDER — HEPARIN SOD (PORK) LOCK FLUSH 100 UNIT/ML IV SOLN
500.0000 [IU] | Freq: Once | INTRAVENOUS | Status: AC
  Administered 2018-11-27: 12:00:00 500 [IU] via INTRAVENOUS

## 2018-11-27 MED ORDER — DEXTROSE 5 % IV SOLN
Freq: Once | INTRAVENOUS | Status: AC
  Administered 2018-11-27: 09:00:00 via INTRAVENOUS
  Filled 2018-11-27: qty 250

## 2018-11-27 MED ORDER — DIPHENHYDRAMINE HCL 25 MG PO CAPS
ORAL_CAPSULE | ORAL | Status: AC
  Filled 2018-11-27: qty 1

## 2018-11-27 MED ORDER — DIPHENHYDRAMINE HCL 25 MG PO CAPS
25.0000 mg | ORAL_CAPSULE | Freq: Once | ORAL | Status: AC
  Administered 2018-11-27: 09:00:00 25 mg via ORAL

## 2018-11-27 MED ORDER — ACETAMINOPHEN 325 MG PO TABS
650.0000 mg | ORAL_TABLET | Freq: Once | ORAL | Status: AC
  Administered 2018-11-27: 09:00:00 650 mg via ORAL

## 2018-11-27 MED ORDER — ACETAMINOPHEN 325 MG PO TABS
ORAL_TABLET | ORAL | Status: AC
  Filled 2018-11-27: qty 2

## 2018-11-28 ENCOUNTER — Inpatient Hospital Stay: Payer: Medicare Other

## 2018-11-28 VITALS — BP 135/71 | HR 61 | Temp 97.6°F | Resp 18

## 2018-11-28 DIAGNOSIS — I129 Hypertensive chronic kidney disease with stage 1 through stage 4 chronic kidney disease, or unspecified chronic kidney disease: Secondary | ICD-10-CM | POA: Diagnosis not present

## 2018-11-28 DIAGNOSIS — G6181 Chronic inflammatory demyelinating polyneuritis: Secondary | ICD-10-CM | POA: Diagnosis not present

## 2018-11-28 DIAGNOSIS — Z87891 Personal history of nicotine dependence: Secondary | ICD-10-CM | POA: Diagnosis not present

## 2018-11-28 DIAGNOSIS — Z79899 Other long term (current) drug therapy: Secondary | ICD-10-CM | POA: Diagnosis not present

## 2018-11-28 DIAGNOSIS — E1122 Type 2 diabetes mellitus with diabetic chronic kidney disease: Secondary | ICD-10-CM | POA: Diagnosis not present

## 2018-11-28 DIAGNOSIS — N189 Chronic kidney disease, unspecified: Secondary | ICD-10-CM | POA: Diagnosis not present

## 2018-11-28 MED ORDER — DIPHENHYDRAMINE HCL 25 MG PO CAPS
25.0000 mg | ORAL_CAPSULE | Freq: Once | ORAL | Status: AC
  Administered 2018-11-28: 09:00:00 25 mg via ORAL

## 2018-11-28 MED ORDER — IMMUNE GLOBULIN (HUMAN) 20 GM/200ML IV SOLN
50.0000 g | Freq: Once | INTRAVENOUS | Status: AC
  Administered 2018-11-28: 50 g via INTRAVENOUS
  Filled 2018-11-28: qty 100

## 2018-11-28 MED ORDER — SODIUM CHLORIDE 0.9% FLUSH
10.0000 mL | Freq: Once | INTRAVENOUS | Status: AC | PRN
  Administered 2018-11-28: 12:00:00 10 mL
  Filled 2018-11-28: qty 10

## 2018-11-28 MED ORDER — ACETAMINOPHEN 325 MG PO TABS
650.0000 mg | ORAL_TABLET | Freq: Once | ORAL | Status: AC
  Administered 2018-11-28: 09:00:00 650 mg via ORAL

## 2018-11-28 MED ORDER — DIPHENHYDRAMINE HCL 25 MG PO CAPS
ORAL_CAPSULE | ORAL | Status: AC
  Filled 2018-11-28: qty 1

## 2018-11-28 MED ORDER — HEPARIN SOD (PORK) LOCK FLUSH 100 UNIT/ML IV SOLN
500.0000 [IU] | Freq: Once | INTRAVENOUS | Status: AC | PRN
  Administered 2018-11-28: 12:00:00 500 [IU]
  Filled 2018-11-28: qty 5

## 2018-11-28 MED ORDER — ACETAMINOPHEN 325 MG PO TABS
ORAL_TABLET | ORAL | Status: AC
  Filled 2018-11-28: qty 2

## 2018-11-28 MED ORDER — DEXTROSE 5 % IV SOLN
Freq: Once | INTRAVENOUS | Status: AC
  Administered 2018-11-28: 09:00:00 via INTRAVENOUS
  Filled 2018-11-28: qty 250

## 2018-11-29 ENCOUNTER — Inpatient Hospital Stay: Payer: Medicare Other

## 2018-12-05 ENCOUNTER — Other Ambulatory Visit: Payer: Self-pay | Admitting: Family Medicine

## 2018-12-05 DIAGNOSIS — I10 Essential (primary) hypertension: Secondary | ICD-10-CM

## 2018-12-05 DIAGNOSIS — F331 Major depressive disorder, recurrent, moderate: Secondary | ICD-10-CM

## 2018-12-07 IMAGING — US US RENAL
1 series · 14 of 25 positions shown · non-contrast
Comparison: None.

CLINICAL DATA: Chronic kidney disease stage 3

EXAM:
RENAL / URINARY TRACT ULTRASOUND COMPLETE

[Series 1: us renal · 0.25mm/px · 14 of 27 slices shown]
[im 1/27]
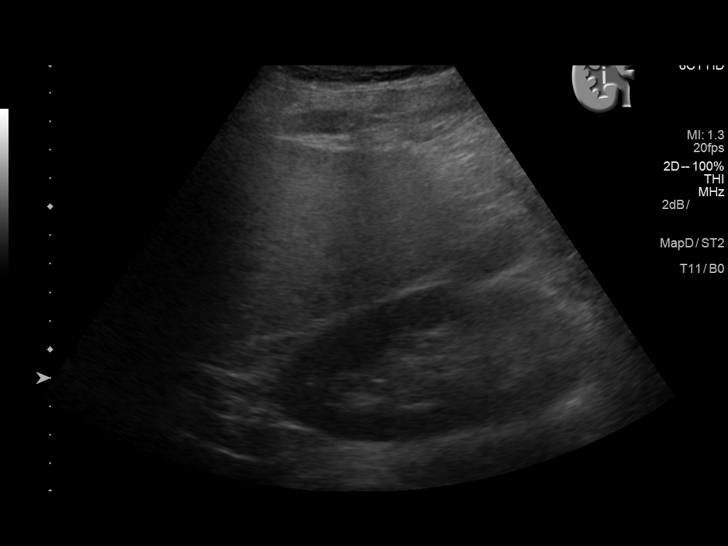
[im 3/27]
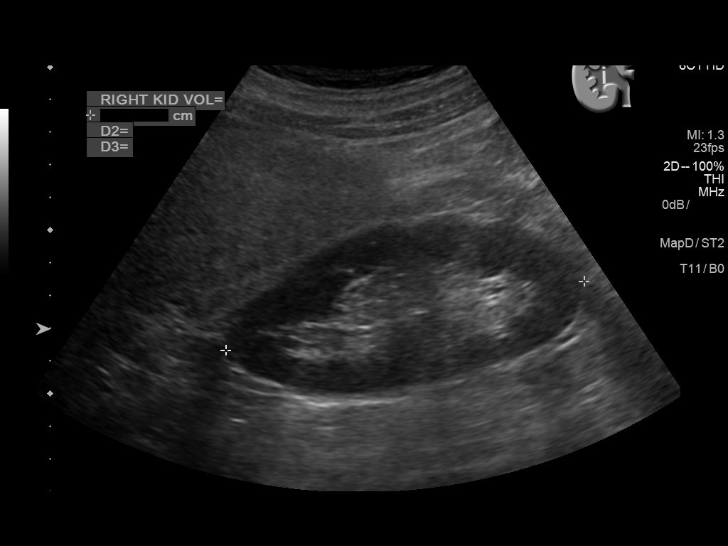
[im 5/27]
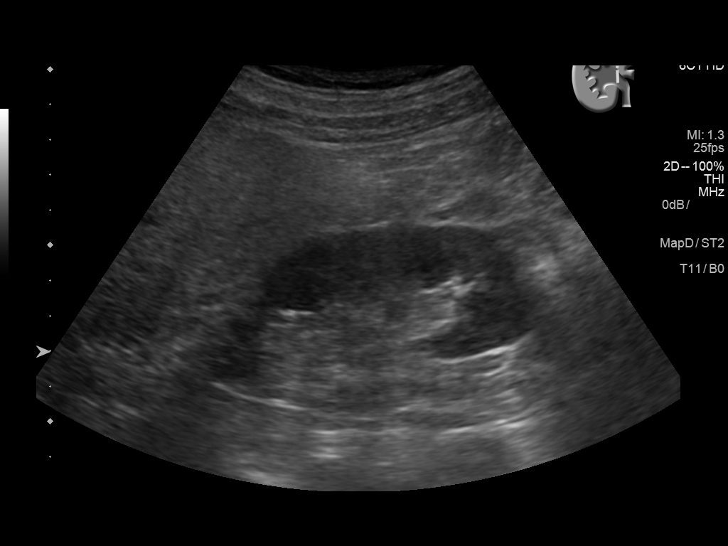
[im 7/27]
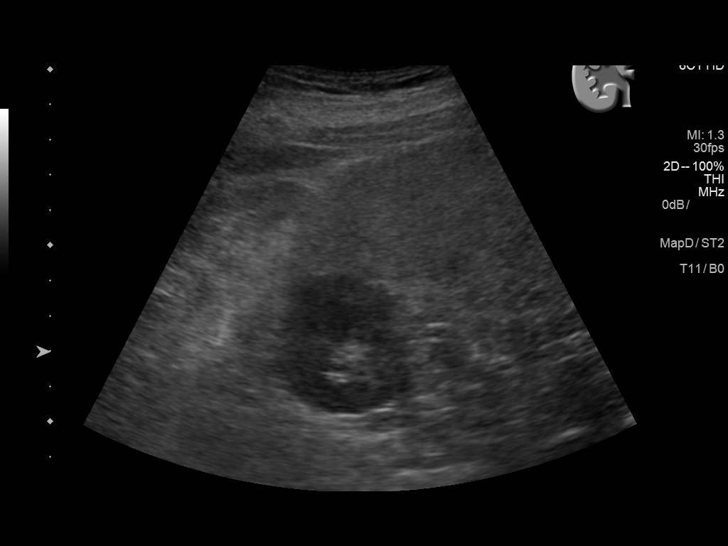
[im 9/27]
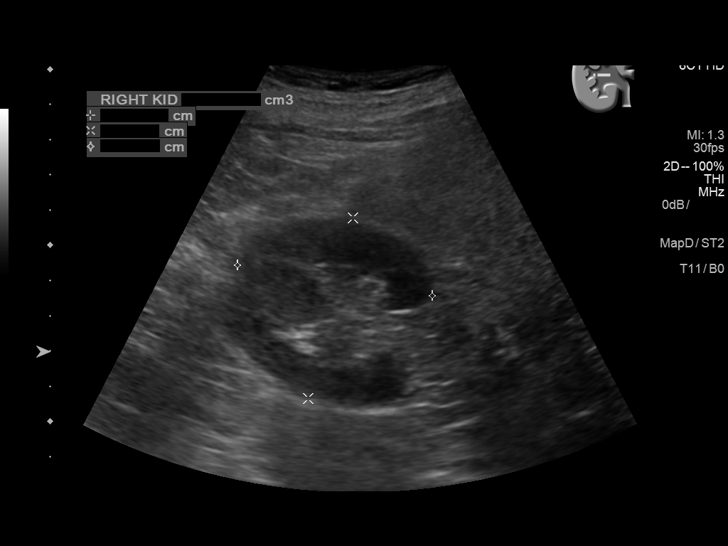
[im 10/27]
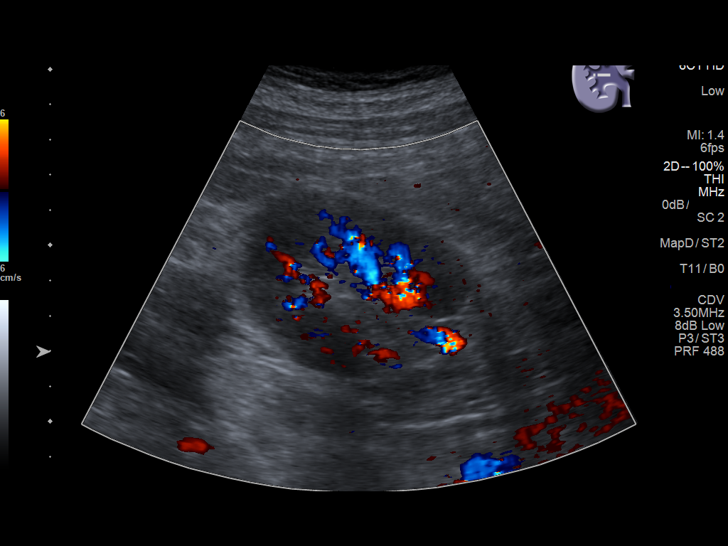
[im 12/27]
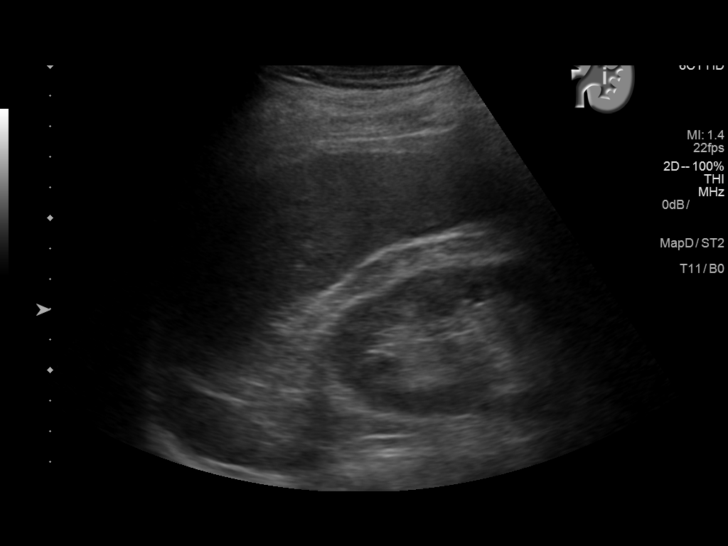
[im 15/27]
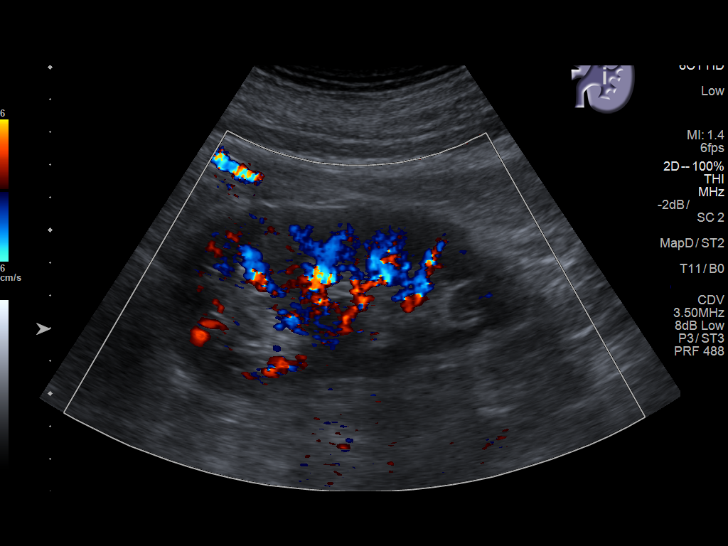
[im 17/27]
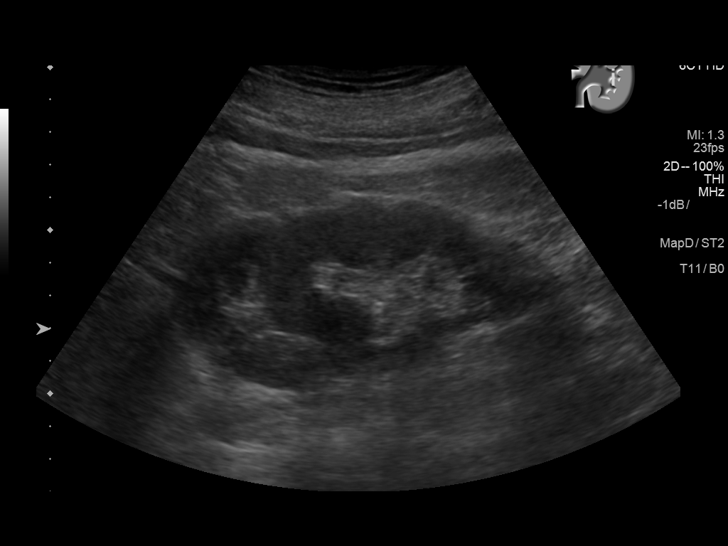
[im 18/27]
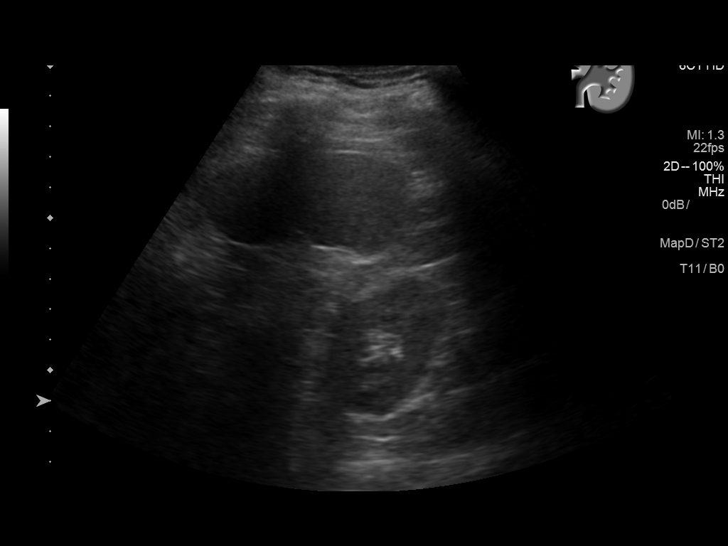
[im 20/27]
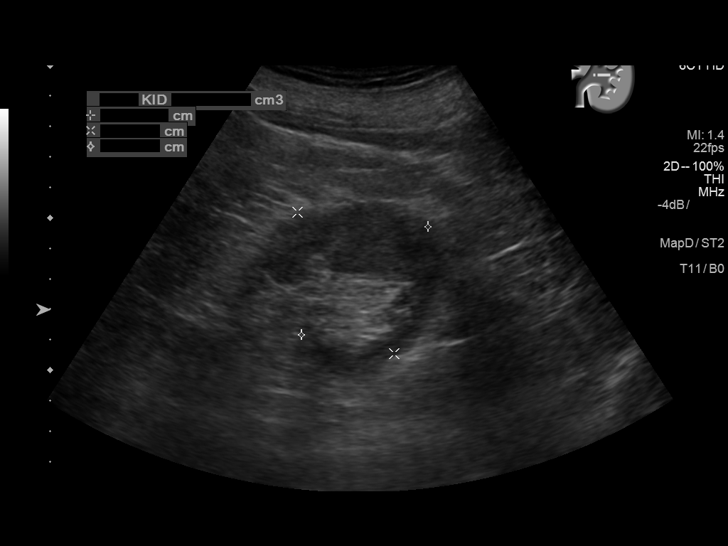
[im 22/27]
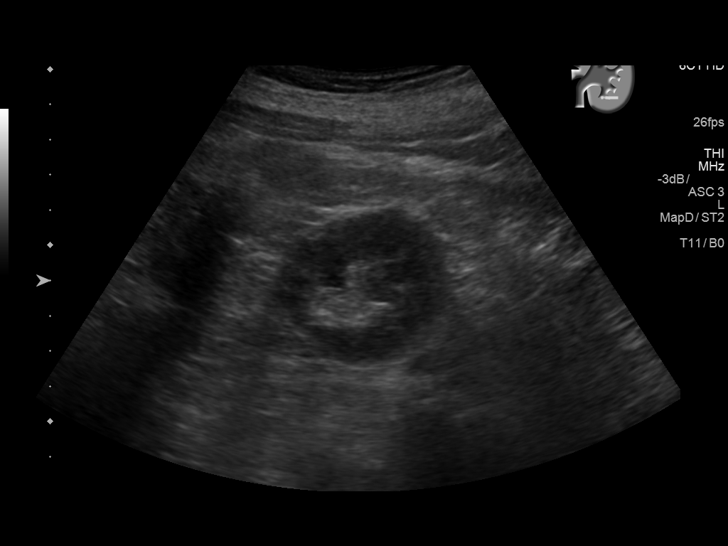
[im 24/27]
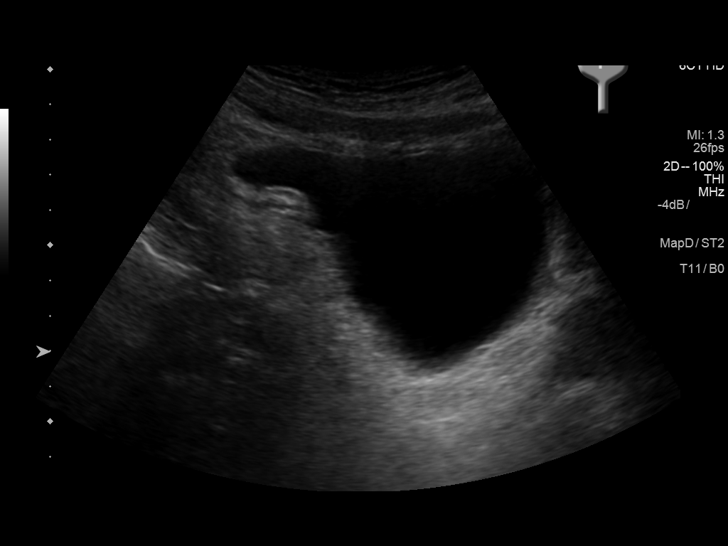
[im 27/27]
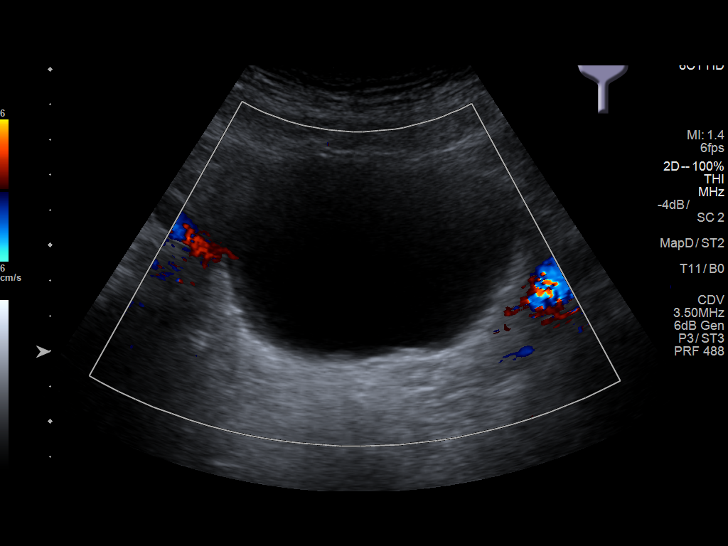

[14 of 25 positions shown; findings below may reference images not displayed]

FINDINGS: Right Kidney:

Renal measurements: 11.2 x 5.3 x 5.6 cm = volume: 172.5 mL .
Echogenicity within normal limits. No mass or hydronephrosis
visualized.

Left Kidney:

Renal measurements: 10.6 x 5.6 x 5.5 cm = volume: 171 mL.
Echogenicity within normal limits. No mass or hydronephrosis
visualized.

Bladder:

Appears normal for degree of bladder distention.
IMPRESSION: Normal renal ultrasound examination.

## 2018-12-08 DIAGNOSIS — Z01812 Encounter for preprocedural laboratory examination: Secondary | ICD-10-CM | POA: Diagnosis not present

## 2018-12-08 DIAGNOSIS — N183 Chronic kidney disease, stage 3 (moderate): Secondary | ICD-10-CM | POA: Diagnosis not present

## 2018-12-08 DIAGNOSIS — E1122 Type 2 diabetes mellitus with diabetic chronic kidney disease: Secondary | ICD-10-CM | POA: Diagnosis not present

## 2018-12-08 DIAGNOSIS — I129 Hypertensive chronic kidney disease with stage 1 through stage 4 chronic kidney disease, or unspecified chronic kidney disease: Secondary | ICD-10-CM | POA: Diagnosis not present

## 2018-12-08 DIAGNOSIS — L03116 Cellulitis of left lower limb: Secondary | ICD-10-CM | POA: Diagnosis not present

## 2018-12-08 DIAGNOSIS — E785 Hyperlipidemia, unspecified: Secondary | ICD-10-CM | POA: Diagnosis not present

## 2018-12-08 DIAGNOSIS — E872 Acidosis: Secondary | ICD-10-CM | POA: Diagnosis not present

## 2018-12-08 DIAGNOSIS — N179 Acute kidney failure, unspecified: Secondary | ICD-10-CM | POA: Diagnosis not present

## 2018-12-08 DIAGNOSIS — Z7901 Long term (current) use of anticoagulants: Secondary | ICD-10-CM | POA: Diagnosis not present

## 2018-12-08 DIAGNOSIS — M79605 Pain in left leg: Secondary | ICD-10-CM | POA: Diagnosis not present

## 2018-12-08 DIAGNOSIS — Z794 Long term (current) use of insulin: Secondary | ICD-10-CM | POA: Diagnosis not present

## 2018-12-08 DIAGNOSIS — R6510 Systemic inflammatory response syndrome (SIRS) of non-infectious origin without acute organ dysfunction: Secondary | ICD-10-CM | POA: Diagnosis not present

## 2018-12-08 DIAGNOSIS — Z7189 Other specified counseling: Secondary | ICD-10-CM | POA: Diagnosis not present

## 2018-12-08 DIAGNOSIS — R7989 Other specified abnormal findings of blood chemistry: Secondary | ICD-10-CM | POA: Diagnosis not present

## 2018-12-08 DIAGNOSIS — I10 Essential (primary) hypertension: Secondary | ICD-10-CM | POA: Diagnosis not present

## 2018-12-08 DIAGNOSIS — Z79899 Other long term (current) drug therapy: Secondary | ICD-10-CM | POA: Diagnosis not present

## 2018-12-09 DIAGNOSIS — I10 Essential (primary) hypertension: Secondary | ICD-10-CM | POA: Diagnosis not present

## 2018-12-09 DIAGNOSIS — N179 Acute kidney failure, unspecified: Secondary | ICD-10-CM | POA: Diagnosis not present

## 2018-12-09 DIAGNOSIS — L03116 Cellulitis of left lower limb: Secondary | ICD-10-CM | POA: Diagnosis not present

## 2018-12-09 DIAGNOSIS — N183 Chronic kidney disease, stage 3 (moderate): Secondary | ICD-10-CM | POA: Diagnosis not present

## 2018-12-09 DIAGNOSIS — E1122 Type 2 diabetes mellitus with diabetic chronic kidney disease: Secondary | ICD-10-CM | POA: Diagnosis not present

## 2018-12-09 DIAGNOSIS — Z794 Long term (current) use of insulin: Secondary | ICD-10-CM | POA: Diagnosis not present

## 2018-12-09 DIAGNOSIS — R6510 Systemic inflammatory response syndrome (SIRS) of non-infectious origin without acute organ dysfunction: Secondary | ICD-10-CM | POA: Diagnosis not present

## 2018-12-09 DIAGNOSIS — E872 Acidosis: Secondary | ICD-10-CM | POA: Diagnosis not present

## 2018-12-10 DIAGNOSIS — I10 Essential (primary) hypertension: Secondary | ICD-10-CM | POA: Diagnosis not present

## 2018-12-10 DIAGNOSIS — Z794 Long term (current) use of insulin: Secondary | ICD-10-CM | POA: Diagnosis not present

## 2018-12-10 DIAGNOSIS — N183 Chronic kidney disease, stage 3 (moderate): Secondary | ICD-10-CM | POA: Diagnosis not present

## 2018-12-10 DIAGNOSIS — L03116 Cellulitis of left lower limb: Secondary | ICD-10-CM | POA: Diagnosis not present

## 2018-12-10 DIAGNOSIS — R6510 Systemic inflammatory response syndrome (SIRS) of non-infectious origin without acute organ dysfunction: Secondary | ICD-10-CM | POA: Diagnosis not present

## 2018-12-10 DIAGNOSIS — E1122 Type 2 diabetes mellitus with diabetic chronic kidney disease: Secondary | ICD-10-CM | POA: Diagnosis not present

## 2018-12-10 DIAGNOSIS — E872 Acidosis: Secondary | ICD-10-CM | POA: Diagnosis not present

## 2018-12-10 DIAGNOSIS — N179 Acute kidney failure, unspecified: Secondary | ICD-10-CM | POA: Diagnosis not present

## 2018-12-11 DIAGNOSIS — Z794 Long term (current) use of insulin: Secondary | ICD-10-CM | POA: Diagnosis not present

## 2018-12-11 DIAGNOSIS — E1122 Type 2 diabetes mellitus with diabetic chronic kidney disease: Secondary | ICD-10-CM | POA: Diagnosis not present

## 2018-12-11 DIAGNOSIS — R6510 Systemic inflammatory response syndrome (SIRS) of non-infectious origin without acute organ dysfunction: Secondary | ICD-10-CM | POA: Diagnosis not present

## 2018-12-11 DIAGNOSIS — N179 Acute kidney failure, unspecified: Secondary | ICD-10-CM | POA: Diagnosis not present

## 2018-12-11 DIAGNOSIS — I10 Essential (primary) hypertension: Secondary | ICD-10-CM | POA: Diagnosis not present

## 2018-12-11 DIAGNOSIS — N183 Chronic kidney disease, stage 3 (moderate): Secondary | ICD-10-CM | POA: Diagnosis not present

## 2018-12-11 DIAGNOSIS — E872 Acidosis: Secondary | ICD-10-CM | POA: Diagnosis not present

## 2018-12-11 DIAGNOSIS — L03116 Cellulitis of left lower limb: Secondary | ICD-10-CM | POA: Diagnosis not present

## 2019-01-14 ENCOUNTER — Other Ambulatory Visit: Payer: Self-pay | Admitting: Family Medicine

## 2019-01-14 DIAGNOSIS — F331 Major depressive disorder, recurrent, moderate: Secondary | ICD-10-CM

## 2019-01-14 DIAGNOSIS — I10 Essential (primary) hypertension: Secondary | ICD-10-CM

## 2019-01-21 ENCOUNTER — Other Ambulatory Visit: Payer: Self-pay | Admitting: Family Medicine

## 2019-01-21 DIAGNOSIS — I10 Essential (primary) hypertension: Secondary | ICD-10-CM

## 2019-01-26 ENCOUNTER — Encounter: Payer: Self-pay | Admitting: Family Medicine

## 2019-02-05 ENCOUNTER — Other Ambulatory Visit: Payer: Self-pay

## 2019-02-06 ENCOUNTER — Inpatient Hospital Stay: Payer: Medicare Other | Attending: Hematology and Oncology

## 2019-02-06 VITALS — BP 197/89 | Resp 20

## 2019-02-06 DIAGNOSIS — N189 Chronic kidney disease, unspecified: Secondary | ICD-10-CM | POA: Insufficient documentation

## 2019-02-06 DIAGNOSIS — Z452 Encounter for adjustment and management of vascular access device: Secondary | ICD-10-CM | POA: Diagnosis not present

## 2019-02-06 DIAGNOSIS — G6181 Chronic inflammatory demyelinating polyneuritis: Secondary | ICD-10-CM | POA: Diagnosis not present

## 2019-02-06 DIAGNOSIS — Z95828 Presence of other vascular implants and grafts: Secondary | ICD-10-CM

## 2019-02-06 MED ORDER — SODIUM CHLORIDE 0.9% FLUSH
10.0000 mL | INTRAVENOUS | Status: DC | PRN
  Administered 2019-02-06: 12:00:00 10 mL via INTRAVENOUS
  Filled 2019-02-06: qty 10

## 2019-02-06 MED ORDER — HEPARIN SOD (PORK) LOCK FLUSH 100 UNIT/ML IV SOLN
500.0000 [IU] | Freq: Once | INTRAVENOUS | Status: AC
  Administered 2019-02-06: 500 [IU] via INTRAVENOUS

## 2019-02-06 NOTE — Progress Notes (Signed)
Patient instructed to contact primary care provider for blood pressure elevation.  Patient verbalized understanding.  He stated that he had run out of medication so he was instructed to go to Dr. Stevenson Clinch office upstairs for refill needs.

## 2019-02-07 ENCOUNTER — Encounter: Payer: Self-pay | Admitting: Family Medicine

## 2019-02-07 ENCOUNTER — Other Ambulatory Visit: Payer: Self-pay

## 2019-02-07 ENCOUNTER — Other Ambulatory Visit: Payer: Self-pay | Admitting: Family Medicine

## 2019-02-07 DIAGNOSIS — I10 Essential (primary) hypertension: Secondary | ICD-10-CM

## 2019-02-07 MED ORDER — AMLODIPINE BESYLATE 10 MG PO TABS: ORAL_TABLET | ORAL | 0 refills | Status: DC

## 2019-02-13 ENCOUNTER — Encounter: Payer: Self-pay | Admitting: Family Medicine

## 2019-02-13 ENCOUNTER — Other Ambulatory Visit: Payer: Self-pay

## 2019-02-13 ENCOUNTER — Ambulatory Visit (INDEPENDENT_AMBULATORY_CARE_PROVIDER_SITE_OTHER): Payer: Medicare Other | Admitting: Family Medicine

## 2019-02-13 VITALS — BP 144/80 | HR 64 | Ht 74.0 in | Wt 212.0 lb

## 2019-02-13 DIAGNOSIS — F331 Major depressive disorder, recurrent, moderate: Secondary | ICD-10-CM | POA: Diagnosis not present

## 2019-02-13 DIAGNOSIS — E782 Mixed hyperlipidemia: Secondary | ICD-10-CM

## 2019-02-13 DIAGNOSIS — I10 Essential (primary) hypertension: Secondary | ICD-10-CM | POA: Diagnosis not present

## 2019-02-13 DIAGNOSIS — Z23 Encounter for immunization: Secondary | ICD-10-CM

## 2019-02-13 MED ORDER — ROSUVASTATIN CALCIUM 20 MG PO TABS: 20.0000 mg | ORAL_TABLET | Freq: Every day | ORAL | 1 refills | Status: AC

## 2019-02-13 MED ORDER — LOSARTAN POTASSIUM-HCTZ 100-25 MG PO TABS: 1.0000 | ORAL_TABLET | Freq: Every day | ORAL | 1 refills | Status: AC

## 2019-02-13 MED ORDER — VENLAFAXINE HCL 100 MG PO TABS: 100.0000 mg | ORAL_TABLET | Freq: Two times a day (BID) | ORAL | 1 refills | Status: AC

## 2019-02-13 MED ORDER — AMLODIPINE BESYLATE 10 MG PO TABS: ORAL_TABLET | ORAL | 1 refills | Status: AC

## 2019-02-13 MED ORDER — CARVEDILOL 12.5 MG PO TABS: 12.5000 mg | ORAL_TABLET | Freq: Two times a day (BID) | ORAL | 1 refills | Status: DC

## 2019-02-13 NOTE — Progress Notes (Signed)
Date:  02/13/2019   Name:  Phillip Fitzgerald   DOB:  Oct 12, 1950   MRN:  [id number]   Chief Complaint: Depression (PHQ9=2 and GAD7=0), Hyperlipidemia, Hypertension, and pneum 23  Depression        This is a chronic problem.  The current episode started more than 1 year ago.   The onset quality is gradual.   The problem occurs intermittently.  The problem has been waxing and waning since onset.  Associated symptoms include no decreased concentration, no fatigue, no helplessness, no hopelessness, does not have insomnia, not irritable, no restlessness, no decreased interest, no appetite change, no body aches, no myalgias, no headaches, no indigestion, not sad and no suicidal ideas.     Exacerbated by: medical stress.  Past treatments include SNRIs - Serotonin and norepinephrine reuptake inhibitors.  Compliance with treatment is good.  Previous treatment provided mild relief.   Pertinent negatives include no hypothyroidism. Hyperlipidemia This is a chronic problem. The current episode started more than 1 year ago. The problem is controlled. Recent lipid tests were reviewed and are normal. He has no history of chronic renal disease, diabetes, hypothyroidism, liver disease or obesity. There are no known factors aggravating his hyperlipidemia. Pertinent negatives include no chest pain, focal sensory loss, focal weakness, leg pain, myalgias or shortness of breath. Current antihyperlipidemic treatment includes statins. The current treatment provides moderate improvement of lipids. There are no compliance problems.   Hypertension This is a chronic problem. The current episode started more than 1 year ago. The problem has been gradually improving since onset. The problem is controlled. Pertinent negatives include no blurred vision, chest pain, headaches, neck pain, orthopnea, palpitations or shortness of breath. Past treatments include calcium channel blockers, beta blockers, alpha 1 blockers, angiotensin  blockers and diuretics. The current treatment provides moderate improvement. There are no compliance problems.  There is no history of angina, kidney disease, CAD/MI, CVA, heart failure, left ventricular hypertrophy, PVD or retinopathy. There is no history of chronic renal disease, a hypertension causing med or renovascular disease.    Review of Systems  Constitutional: Negative for appetite change, chills, fatigue and fever.  HENT: Negative for drooling, ear discharge, ear pain and sore throat.   Eyes: Negative for blurred vision.  Respiratory: Negative for cough, shortness of breath and wheezing.   Cardiovascular: Negative for chest pain, palpitations, orthopnea and leg swelling.  Gastrointestinal: Negative for abdominal pain, blood in stool, constipation, diarrhea and nausea.  Endocrine: Negative for polydipsia.  Genitourinary: Negative for dysuria, frequency, hematuria and urgency.  Musculoskeletal: Negative for back pain, myalgias and neck pain.  Skin: Negative for rash.  Allergic/Immunologic: Negative for environmental allergies.  Neurological: Negative for dizziness, focal weakness and headaches.  Hematological: Does not bruise/bleed easily.  Psychiatric/Behavioral: Positive for depression. Negative for decreased concentration and suicidal ideas. The patient is not nervous/anxious and does not have insomnia.     Patient Active Problem List   Diagnosis Date Noted  . Special screening for malignant neoplasms, colon   . Benign neoplasm of transverse colon   . Polyp of sigmoid colon   . Moderate episode of recurrent major depressive disorder ([place]) 09/26/2017  . Hyperlipidemia 12/28/2014  . Adiposity 11/02/2014  . Chronic inflammatory demyelinating polyneuritis ([place]) 09/10/2013  . Diabetes ([place]) 09/10/2013  . Essential hypertension 09/10/2013  . Obstructive apnea 09/10/2013  . Allergic rhinitis, seasonal 09/10/2013    Allergies  Allergen Reactions  . Penicillins Other (See  Comments)    Unknown reaction  from [place]    Past Surgical History:  Procedure Laterality Date  . APPENDECTOMY    . COLONOSCOPY  2011   cleared for 5 yrs- Dr Allen Norris  . COLONOSCOPY WITH PROPOFOL N/A 10/15/2017   Procedure: COLONOSCOPY WITH PROPOFOL;  Surgeon: [name], MD;  Location: [place];  Service: Endoscopy;  Laterality: N/A;  Diabetic and sleep apnea  . KNEE SURGERY Right    scope  . POLYPECTOMY  10/15/2017   Procedure: POLYPECTOMY;  Surgeon: [name], MD;  Location: [place];  Service: Endoscopy;;  . PORTACATH PLACEMENT Right 09/16/2015   Procedure: INSERTION PORT-A-CATH;  Surgeon: Leonie Green, MD;  Location: [facility];  Service: General;  Laterality: Right;  . TONSILLECTOMY      Social History   Tobacco Use  . Smoking status: Former Smoker    Quit date: 05/08/1978    Years since quitting: 40.7  . Smokeless tobacco: Never Used  Substance Use Topics  . Alcohol use: Yes    Alcohol/week: 3.0 standard drinks    Types: 3 Cans of beer per week  . Drug use: No     Medication list has been reviewed and updated.  Current Meds  Medication Sig  . amLODipine (NORVASC) 10 MG tablet TAKE 1 TABLET BY MOUTH ONCE DAILY. NEED APPOINTMENT FOR FURTHER REFILLS  . carvedilol (COREG) 12.5 MG tablet TAKE 1 TABLET BY MOUTH TWICE A DAY  . Cetirizine HCl (ZYRTEC ALLERGY) 10 MG CAPS Take 1 capsule by mouth daily.   . Continuous Blood Gluc Receiver (FREESTYLE LIBRE 14 DAY READER) DEVI Use 1 each as needed .  Use to test blood sugar  . Continuous Blood Gluc Sensor (FREESTYLE LIBRE 14 DAY SENSOR) MISC Use 1 each every 14 (fourteen) days  . Dulaglutide (TRULICITY) [id number] [id number] SOPN Inject into the skin once a week. Dr Honor Junes  . hydrALAZINE (APRESOLINE) 10 MG tablet Take 10 mg by mouth daily.   . Immune Globulin 10% (GAMUNEX-C) 10 GM/100ML SOLN Inject into the vein every 4 (four) months. 50 grams- Dr Shah/ now every 5 months  . Insulin Glargine (LANTUS  SOLOSTAR) 100 UNIT/ML Solostar Pen INJECT 52 UNITS UNDER THE SKIN AT BEDTIME- Dr Maretta Bees  . Insulin Lispro (HUMALOG PEN [place]) Inject into the skin as directed. Sliding Scale: 7-14 Untis  . Insulin Pen Needle (FIFTY50 PEN NEEDLES) 31G X 8 MM MISC Use 4 (four) times daily. DX: E11.8  . losartan-hydrochlorothiazide (HYZAAR) 100-25 MG tablet Take 1 tablet by mouth daily.  . metFORMIN (GLUCOPHAGE) 500 MG tablet Take 1 tablet (500 mg total) by mouth 2 (two) times daily. (Patient taking differently: Take 500 mg by mouth 2 (two) times daily. Dr Honor Junes)  . rosuvastatin (CRESTOR) 20 MG tablet TAKE 1 TABLET BY MOUTH EVERY DAY  . venlafaxine (EFFEXOR) 100 MG tablet Take 1 tablet (100 mg total) by mouth 2 (two) times daily.    PHQ 2/9 Scores 02/13/2019 04/01/2018 04/01/2018 09/26/2017  PHQ - 2 Score 0 0 0 0  PHQ- 9 Score 2 3 3 2     BP Readings from Last 3 Encounters:  02/13/19 (!) 144/80  02/06/19 (!) 197/89  11/28/18 135/71    Physical Exam Vitals signs and nursing note reviewed.  Constitutional:      General: He is not irritable. HENT:     Head: Normocephalic.     Right Ear: Tympanic membrane, ear canal and external ear normal.     Left Ear: Tympanic membrane, ear canal and external ear normal.  Nose: Nose normal. No congestion or rhinorrhea.  Eyes:     General: No scleral icterus.       Right eye: No discharge.        Left eye: No discharge.     Conjunctiva/sclera: Conjunctivae normal.     Pupils: Pupils are equal, round, and reactive to light.  Neck:     Musculoskeletal: Normal range of motion and neck supple.     Thyroid: No thyromegaly.     Vascular: No JVD.     Trachea: No tracheal deviation.  Cardiovascular:     Rate and Rhythm: Normal rate and regular rhythm.     Heart sounds: Normal heart sounds. No murmur. No friction rub. No gallop.   Pulmonary:     Effort: No respiratory distress.     Breath sounds: Normal breath sounds. No wheezing, rhonchi or rales.  Chest:     Chest  wall: No tenderness.  Abdominal:     General: Bowel sounds are normal.     Palpations: Abdomen is soft. There is no mass.     Tenderness: There is no abdominal tenderness. There is no guarding or rebound.  Musculoskeletal: Normal range of motion.        General: No tenderness.  Lymphadenopathy:     Cervical: No cervical adenopathy.  Skin:    General: Skin is warm.     Findings: No rash.  Neurological:     Mental Status: He is alert and oriented to person, place, and time.     Cranial Nerves: No cranial nerve deficit.     Deep Tendon Reflexes: Reflexes are normal and symmetric.     Wt Readings from Last 3 Encounters:  02/13/19 212 lb (96.2 kg)  11/25/18 210 lb 8.6 oz (95.5 kg)  07/01/18 213 lb 8.3 oz (96.9 kg)    BP (!) 144/80   Pulse 64   Ht 6\' 2"  (1.88 m)   Wt 212 lb (96.2 kg)   BMI 27.22 kg/m    Assessment and Plan: 1. Essential hypertension Chronic.  Controlled.  Stable.  Continue amlodipine 10 mg once a day, Coreg 12.5 mg twice a day, and losartan hydrochlorothiazide 100-25 once a day. - amLODipine (NORVASC) 10 MG tablet; TAKE 1 TABLET BY MOUTH ONCE DAILY. NEED APPOINTMENT FOR FURTHER REFILLS  Dispense: 90 tablet; Refill: 1 - carvedilol (COREG) 12.5 MG tablet; Take 1 tablet (12.5 mg total) by mouth 2 (two) times daily.  Dispense: 180 tablet; Refill: 1 - losartan-hydrochlorothiazide (HYZAAR) 100-25 MG tablet; Take 1 tablet by mouth daily.  Dispense: 90 tablet; Refill: 1  2. Mixed hyperlipidemia Chronic.  Controlled.  Stable.  Continue rosuvastatin 20 mg once a day. - rosuvastatin (CRESTOR) 20 MG tablet; Take 1 tablet (20 mg total) by mouth daily.  Dispense: 90 tablet; Refill: 1  3. Moderate episode of recurrent major depressive disorder (HCC) PHQ score 0 patient tolerating medication well and will continue venlafaxine 100 mg twice a day. - venlafaxine (EFFEXOR) 100 MG tablet; Take 1 tablet (100 mg total) by mouth 2 (two) times daily.  Dispense: 180 tablet; Refill: 1   4. Need for pneumococcal vaccination Discussed and administered - Pneumococcal polysaccharide vaccine 23-valent greater than or equal to 2yo subcutaneous/IM

## 2019-02-14 DIAGNOSIS — R29898 Other symptoms and signs involving the musculoskeletal system: Secondary | ICD-10-CM | POA: Diagnosis not present

## 2019-02-14 DIAGNOSIS — Z9989 Dependence on other enabling machines and devices: Secondary | ICD-10-CM | POA: Diagnosis not present

## 2019-02-14 DIAGNOSIS — M62541 Muscle wasting and atrophy, not elsewhere classified, right hand: Secondary | ICD-10-CM | POA: Diagnosis not present

## 2019-02-14 DIAGNOSIS — G6181 Chronic inflammatory demyelinating polyneuritis: Secondary | ICD-10-CM | POA: Diagnosis not present

## 2019-02-14 DIAGNOSIS — E1159 Type 2 diabetes mellitus with other circulatory complications: Secondary | ICD-10-CM | POA: Diagnosis not present

## 2019-02-14 DIAGNOSIS — I10 Essential (primary) hypertension: Secondary | ICD-10-CM | POA: Diagnosis not present

## 2019-02-14 DIAGNOSIS — G4733 Obstructive sleep apnea (adult) (pediatric): Secondary | ICD-10-CM | POA: Diagnosis not present

## 2019-02-17 ENCOUNTER — Other Ambulatory Visit: Payer: Self-pay | Admitting: Neurology

## 2019-02-17 DIAGNOSIS — R29898 Other symptoms and signs involving the musculoskeletal system: Secondary | ICD-10-CM

## 2019-02-25 ENCOUNTER — Ambulatory Visit: Payer: Medicare Other

## 2019-03-01 ENCOUNTER — Other Ambulatory Visit: Payer: Self-pay

## 2019-03-01 ENCOUNTER — Ambulatory Visit
Admission: RE | Admit: 2019-03-01 | Discharge: 2019-03-01 | Disposition: A | Discharge status: Home / Self Care | Payer: Medicare Other | Source: Ambulatory Visit | Attending: Neurology | Admitting: Neurology

## 2019-03-01 DIAGNOSIS — R29898 Other symptoms and signs involving the musculoskeletal system: Secondary | ICD-10-CM | POA: Insufficient documentation

## 2019-03-01 DIAGNOSIS — M50223 Other cervical disc displacement at C6-C7 level: Secondary | ICD-10-CM | POA: Diagnosis not present

## 2019-03-12 DIAGNOSIS — E119 Type 2 diabetes mellitus without complications: Secondary | ICD-10-CM | POA: Diagnosis not present

## 2019-03-12 DIAGNOSIS — H01021 Squamous blepharitis right upper eyelid: Secondary | ICD-10-CM | POA: Diagnosis not present

## 2019-03-12 DIAGNOSIS — H16223 Keratoconjunctivitis sicca, not specified as Sjogren's, bilateral: Secondary | ICD-10-CM | POA: Diagnosis not present

## 2019-03-12 DIAGNOSIS — H26493 Other secondary cataract, bilateral: Secondary | ICD-10-CM | POA: Diagnosis not present

## 2019-03-24 DIAGNOSIS — I10 Essential (primary) hypertension: Secondary | ICD-10-CM | POA: Diagnosis not present

## 2019-03-24 DIAGNOSIS — R809 Proteinuria, unspecified: Secondary | ICD-10-CM | POA: Diagnosis not present

## 2019-03-24 DIAGNOSIS — E1122 Type 2 diabetes mellitus with diabetic chronic kidney disease: Secondary | ICD-10-CM | POA: Diagnosis not present

## 2019-03-24 DIAGNOSIS — N1831 Chronic kidney disease, stage 3a: Secondary | ICD-10-CM | POA: Diagnosis not present

## 2019-04-02 DIAGNOSIS — E119 Type 2 diabetes mellitus without complications: Secondary | ICD-10-CM | POA: Diagnosis not present

## 2019-04-02 DIAGNOSIS — H16223 Keratoconjunctivitis sicca, not specified as Sjogren's, bilateral: Secondary | ICD-10-CM | POA: Diagnosis not present

## 2019-04-02 DIAGNOSIS — H01021 Squamous blepharitis right upper eyelid: Secondary | ICD-10-CM | POA: Diagnosis not present

## 2019-04-02 DIAGNOSIS — H26493 Other secondary cataract, bilateral: Secondary | ICD-10-CM | POA: Diagnosis not present

## 2019-04-14 DIAGNOSIS — G6181 Chronic inflammatory demyelinating polyneuritis: Secondary | ICD-10-CM | POA: Diagnosis not present

## 2019-04-21 ENCOUNTER — Ambulatory Visit: Payer: Medicare Other

## 2019-04-21 ENCOUNTER — Other Ambulatory Visit: Payer: Medicare Other

## 2019-04-22 ENCOUNTER — Ambulatory Visit: Payer: Medicare Other

## 2019-04-23 ENCOUNTER — Ambulatory Visit: Payer: Medicare Other

## 2019-04-24 ENCOUNTER — Ambulatory Visit: Payer: Medicare Other

## 2019-04-28 ENCOUNTER — Ambulatory Visit: Payer: Medicare Other

## 2019-04-28 ENCOUNTER — Ambulatory Visit: Payer: Medicare Other | Admitting: Hematology and Oncology

## 2019-04-28 ENCOUNTER — Other Ambulatory Visit: Payer: Medicare Other

## 2019-04-28 LAB — HEMOGLOBIN A1C: Hemoglobin A1C: 7.2

## 2019-06-06 DIAGNOSIS — G6181 Chronic inflammatory demyelinating polyneuritis: Secondary | ICD-10-CM | POA: Diagnosis not present

## 2019-06-06 DIAGNOSIS — N1831 Chronic kidney disease, stage 3a: Secondary | ICD-10-CM | POA: Diagnosis not present

## 2019-06-06 DIAGNOSIS — I129 Hypertensive chronic kidney disease with stage 1 through stage 4 chronic kidney disease, or unspecified chronic kidney disease: Secondary | ICD-10-CM | POA: Diagnosis not present

## 2019-06-06 DIAGNOSIS — R140 Abdominal distension (gaseous): Secondary | ICD-10-CM | POA: Diagnosis not present

## 2019-06-06 DIAGNOSIS — E1122 Type 2 diabetes mellitus with diabetic chronic kidney disease: Secondary | ICD-10-CM | POA: Diagnosis not present

## 2019-06-06 DIAGNOSIS — R808 Other proteinuria: Secondary | ICD-10-CM | POA: Diagnosis not present

## 2019-06-12 DIAGNOSIS — Z794 Long term (current) use of insulin: Secondary | ICD-10-CM | POA: Diagnosis not present

## 2019-06-12 DIAGNOSIS — E1140 Type 2 diabetes mellitus with diabetic neuropathy, unspecified: Secondary | ICD-10-CM | POA: Diagnosis not present

## 2019-06-12 DIAGNOSIS — E785 Hyperlipidemia, unspecified: Secondary | ICD-10-CM | POA: Diagnosis not present

## 2019-06-12 DIAGNOSIS — E1121 Type 2 diabetes mellitus with diabetic nephropathy: Secondary | ICD-10-CM | POA: Diagnosis not present

## 2019-06-20 DIAGNOSIS — N1831 Chronic kidney disease, stage 3a: Secondary | ICD-10-CM | POA: Diagnosis not present

## 2019-06-26 DIAGNOSIS — N1831 Chronic kidney disease, stage 3a: Secondary | ICD-10-CM | POA: Diagnosis not present

## 2019-06-30 DIAGNOSIS — G6181 Chronic inflammatory demyelinating polyneuritis: Secondary | ICD-10-CM | POA: Diagnosis not present

## 2019-06-30 DIAGNOSIS — Z452 Encounter for adjustment and management of vascular access device: Secondary | ICD-10-CM | POA: Diagnosis not present

## 2019-06-30 DIAGNOSIS — Z79899 Other long term (current) drug therapy: Secondary | ICD-10-CM | POA: Diagnosis not present

## 2019-07-28 ENCOUNTER — Other Ambulatory Visit: Payer: Self-pay

## 2019-07-28 NOTE — Progress Notes (Unsigned)
Put in A1C

## 2019-08-20 ENCOUNTER — Encounter: Payer: Self-pay | Admitting: Family Medicine

## 2019-08-20 ENCOUNTER — Telehealth: Payer: Self-pay | Admitting: Family Medicine

## 2019-08-20 NOTE — Telephone Encounter (Signed)
Left message for patient to call back and schedule Medicare Annual Wellness Visit (AWV) either virtually/audio only or in office. Whichever the patients preference is.  Last AWV 04/01/18; please schedule at anytime with [facility] Health Advisor.

## 2019-08-21 NOTE — Telephone Encounter (Signed)
Pt sent MyChart message to let us know he has moved. I will remove his PCP. Please update call list. Thank you!

## 2019-08-23 ENCOUNTER — Other Ambulatory Visit: Payer: Self-pay | Admitting: Family Medicine

## 2019-08-23 DIAGNOSIS — I10 Essential (primary) hypertension: Secondary | ICD-10-CM

## 2019-09-28 ENCOUNTER — Other Ambulatory Visit: Payer: Self-pay | Admitting: Family Medicine

## 2019-09-28 DIAGNOSIS — E782 Mixed hyperlipidemia: Secondary | ICD-10-CM

## 2019-10-25 ENCOUNTER — Other Ambulatory Visit: Payer: Self-pay | Admitting: Family Medicine

## 2019-10-25 DIAGNOSIS — I10 Essential (primary) hypertension: Secondary | ICD-10-CM

## 2019-10-25 NOTE — Telephone Encounter (Signed)
30 day courtesy RF Requested Prescriptions  Pending Prescriptions Disp Refills  . carvedilol (COREG) 12.5 MG tablet [Pharmacy Med Name: CARVEDILOL 12.5 MG TABLET] 60 tablet 0    Sig: TAKE 1 TABLET BY MOUTH 2 TIMES DAILY.     Cardiovascular:  Beta Blockers Failed - 10/25/2019  5:44 PM      Failed - Last BP in normal range    BP Readings from Last 1 Encounters:  02/13/19 (!) 144/80         Failed - Valid encounter within last 6 months    Recent Outpatient Visits          8 months ago Essential hypertension   Bennett, Deanna C, MD   1 year ago Essential hypertension   [facility] Juline Patch, MD   2 years ago Essential hypertension   [facility] Juline Patch, MD   2 years ago Moderate episode of recurrent major depressive disorder [facility])   [facility] Juline Patch, MD   3 years ago Essential hypertension   [place], MD             Passed - Last Heart Rate in normal range    Pulse Readings from Last 1 Encounters:  02/13/19 64

## 2020-03-24 IMAGING — MR MR CERVICAL SPINE W/O CM
5 series · 38 of 48 positions shown · non-contrast
Comparison: None.

CLINICAL DATA: Right hand weakness and muscle atrophy. Chronic
inflammatory demyelinating polyneuropathy.

EXAM:
MRI CERVICAL SPINE WITHOUT CONTRAST
TECHNIQUE: Multiplanar, multisequence MR imaging of the cervical spine was
performed. No intravenous contrast was administered.

[Series 5: T2 · sagittal · 3.0mm · 0.62mm/px · 6 of 15 slices shown (1 of 2)]
[im 1/15]
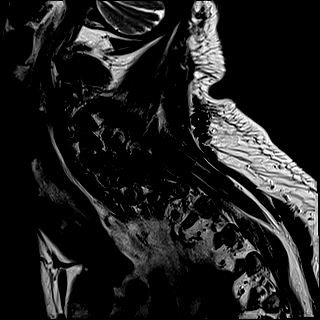
[im 3/15]
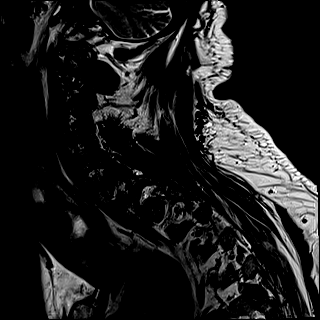
[im 6/15]
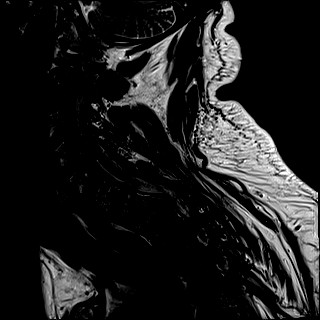
[im 9/15]
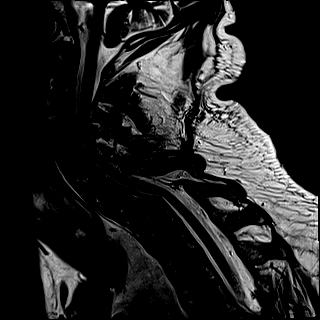
[im 12/15]
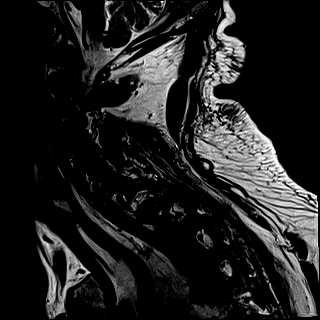
[im 15/15]
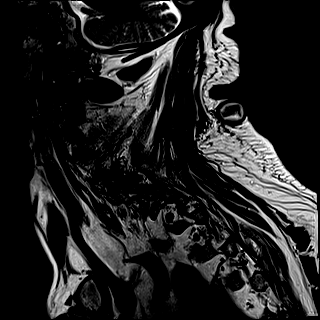

[Series 6: FLAIR · sagittal · 3.0mm · 0.78mm/px · 7 of 15 slices shown]
[im 1/15]
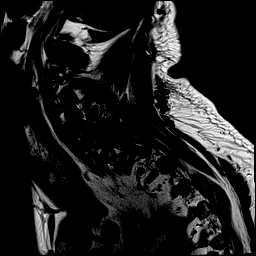
[im 3/15]
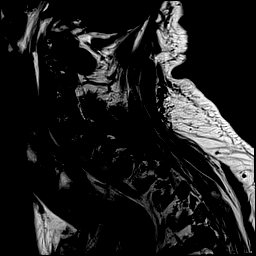
[im 5/15]
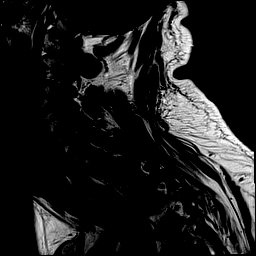
[im 8/15]
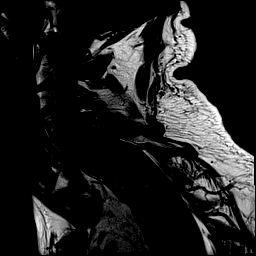
[im 10/15]
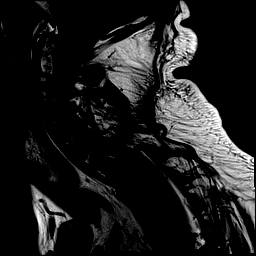
[im 12/15]
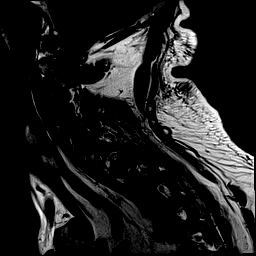
[im 15/15]
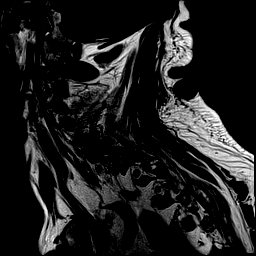

[Series 7: STIR · sagittal · 3.0mm · 0.62mm/px · 7 of 15 slices shown]
[im 1/15]
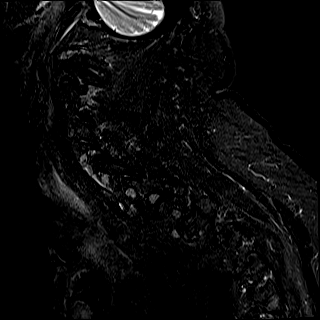
[im 3/15]
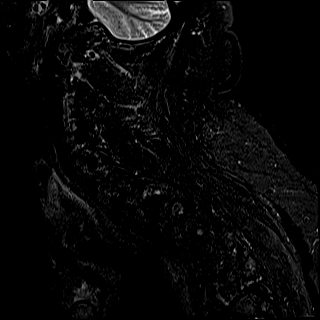
[im 5/15]
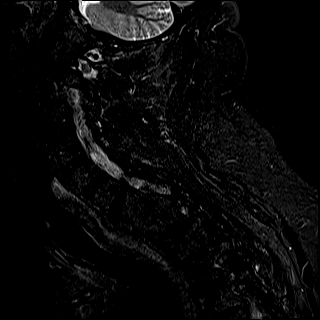
[im 8/15]
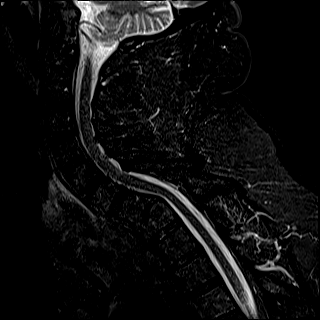
[im 10/15]
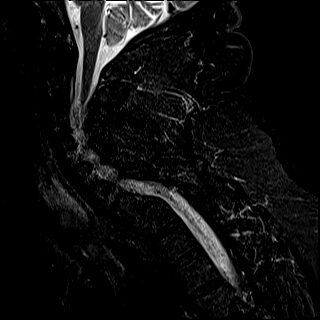
[im 12/15]
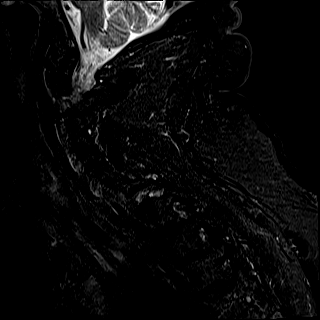
[im 15/15]
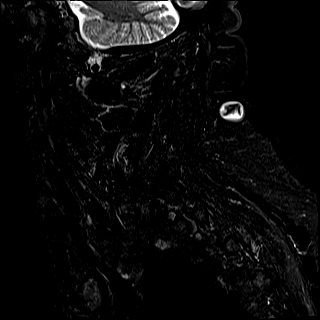

[Series 8: T2 · oblique · 3.0mm · 0.70mm/px · 10 of 29 slices shown (2 of 2)]
[im 1/29]
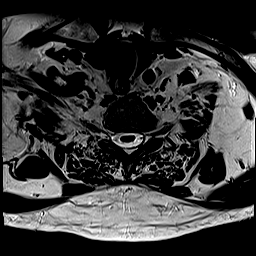
[im 3/29]
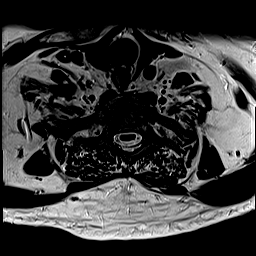
[im 5/29]
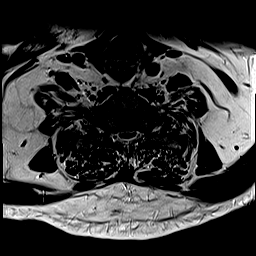
[im 7/29]
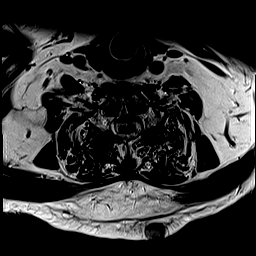
[im 9/29]
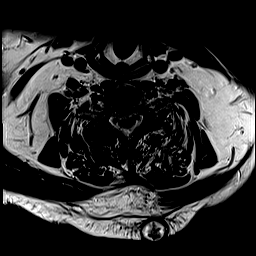
[im 13/29]
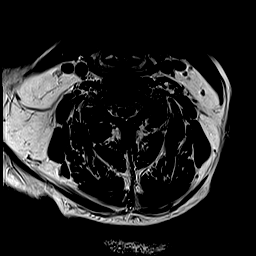
[im 16/29]
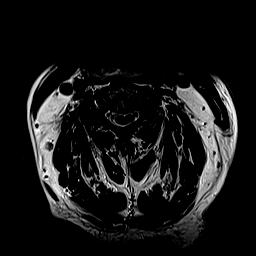
[im 20/29]
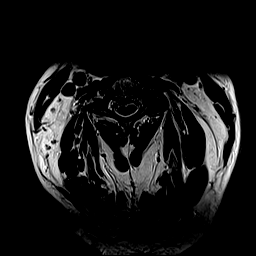
[im 24/29]
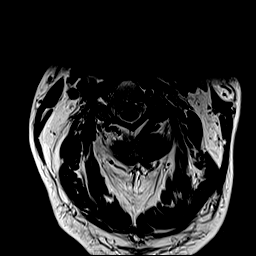
[im 29/29]
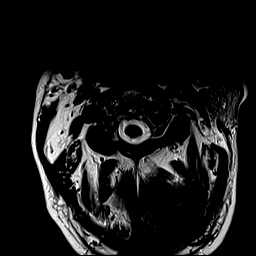

[Series 9: ax mpgr · oblique · 3.0mm · 0.35mm/px · 8 of 29 slices shown]
[im 1/29]
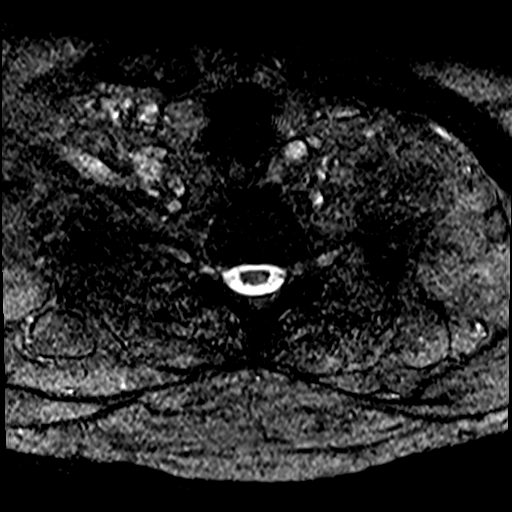
[im 5/29]
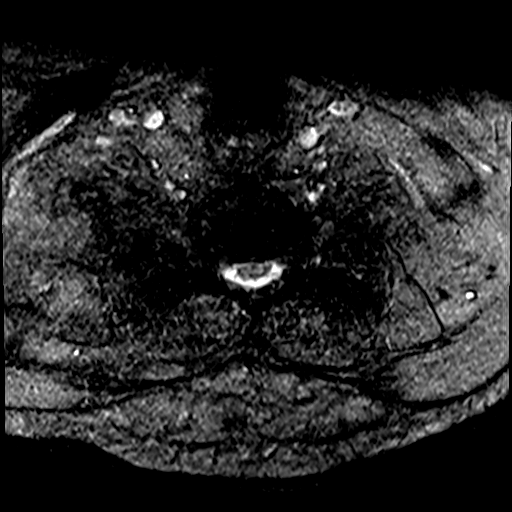
[im 9/29]
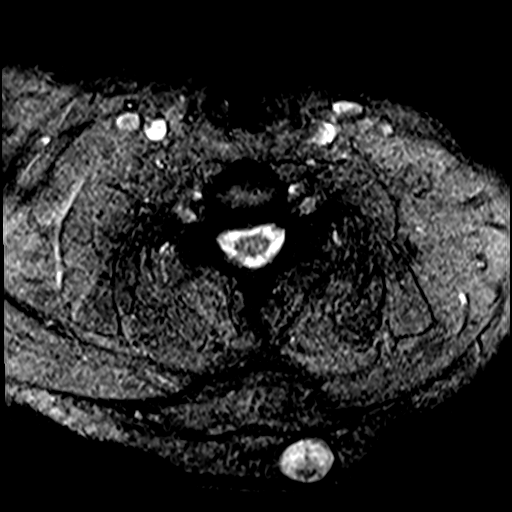
[im 13/29]
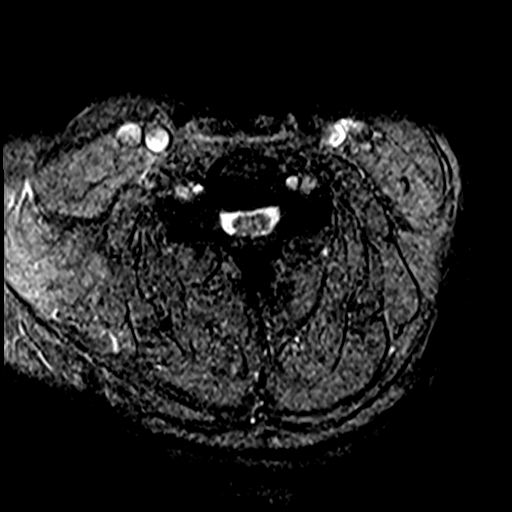
[im 16/29]
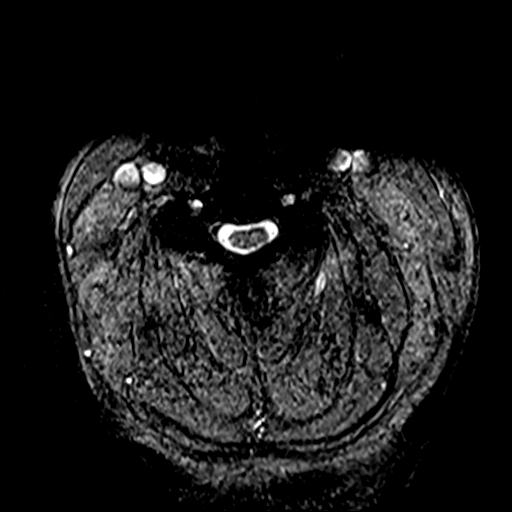
[im 20/29]
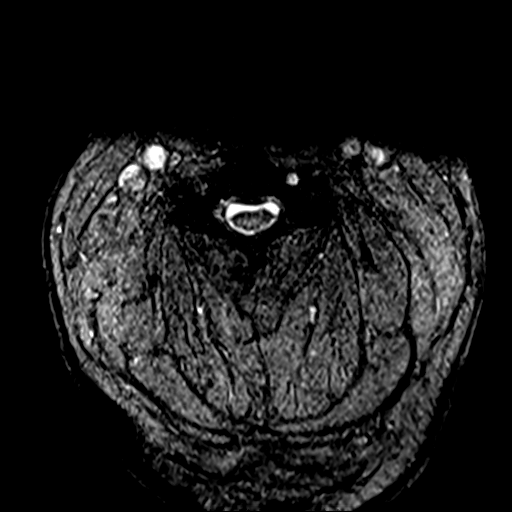
[im 24/29]
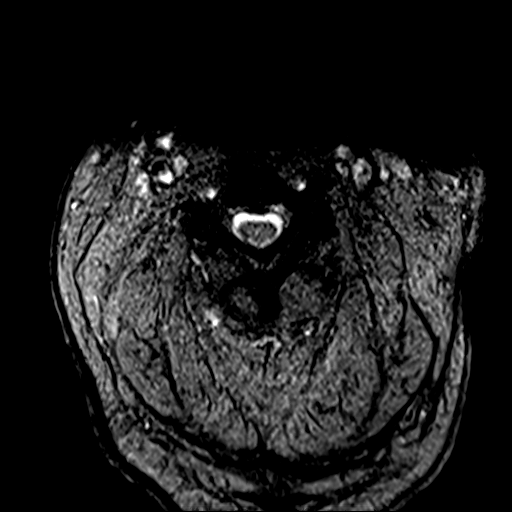
[im 29/29]
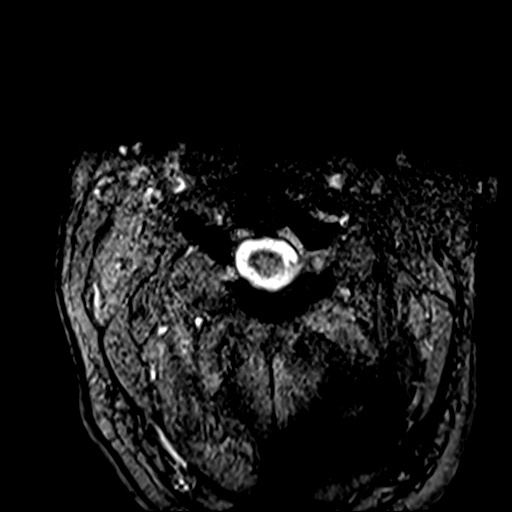

[38 of 48 positions shown; findings below may reference images not displayed]

FINDINGS: Alignment: There is a chronic acute kyphotic angulation at the C7-T1
level, likely due to an old fracture of the anterior superior aspect
of T1. Alignment is otherwise normal.

Vertebrae: Evidence consistent with a prior remote fracture of the
anterior superior aspect of T1 with a 6 mm subluxation of C7 on T1,
chronic. The lateral masses at C7-T1 are fused. No other significant
bone abnormality.

Cord: There is no mass lesion or myelopathy. There is a slight
angulation in the course of the cervical spinal cord at C7-T1 with
the spinal cord is not compressed. There is no spinal stenosis.

Posterior Fossa, vertebral arteries, paraspinal tissues: Negative.

Disc levels:

C2-3: Normal.

C3-4: No disc bulging or protrusion. Minimal left facet arthritis.
No foraminal stenosis.

C4-5: Normal disc. Slight left facet arthritis. No foraminal
stenosis.

C5-6: Normal disc. Minimal left facet arthritis. No foraminal
stenosis.

C6-7: There is a small central disc protrusion slightly asymmetric
to the right touching the ventral aspect of the spinal cord but
without myelopathy. Widely patent neural foramina. Slight left facet
arthritis.

C7-T1: The C7-T1 vertebral bodies and lateral masses are fused.
Chronic 6 mm spondylolisthesis of C7 on T1 likely due to an old
compression fracture of the anterior superior endplate of T1. No
foraminal or spinal stenosis. No spinal cord compression.

T1-2 through T3-4: Negative.
IMPRESSION: 1. No significant abnormality of the cervical spine.
2. Degenerative disc disease at C6-7 with a small central disc
protrusion without myelopathy.
3. Slight left facet arthritis at C3-4 and C4-5.
4. Chronic spondylolisthesis of C7 on T1 due to an old compression
fracture of the anterior superior endplate of T1.
5. No evidence of myelopathy or spinal cord compression or other
neural impingement.

## 2023-03-06 ENCOUNTER — Telehealth: Payer: Self-pay | Admitting: Gastroenterology

## 2023-03-06 NOTE — Telephone Encounter (Signed)
Pt had colonoscopy with wohl in 2019 was told to return in 5 years

## 2023-03-06 NOTE — Telephone Encounter (Addendum)
Message left for patient to return my call.  

## 2023-03-08 NOTE — Telephone Encounter (Signed)
Message left for patient to return my call.  

## 2023-03-12 NOTE — Telephone Encounter (Signed)
Letter will be sent to patient regarding recall again to call me directly.
